# Patient Record
Sex: Female | Born: 1985 | Race: Black or African American | Hispanic: No | Marital: Single | Smoking: Current every day smoker
Health system: Southern US, Community
[De-identification: ages and names within clinical notes are randomized; demographics above are authoritative.]

## PROBLEM LIST (undated history)

## (undated) DIAGNOSIS — F419 Anxiety disorder, unspecified: Secondary | ICD-10-CM

## (undated) DIAGNOSIS — L0291 Cutaneous abscess, unspecified: Secondary | ICD-10-CM

## (undated) DIAGNOSIS — F32A Depression, unspecified: Secondary | ICD-10-CM

## (undated) DIAGNOSIS — F329 Major depressive disorder, single episode, unspecified: Secondary | ICD-10-CM

## (undated) DIAGNOSIS — E669 Obesity, unspecified: Secondary | ICD-10-CM

---

## 2007-10-21 ENCOUNTER — Emergency Department (HOSPITAL_COMMUNITY): Admission: EM | Admit: 2007-10-21 | Discharge: 2007-10-21 | Payer: Self-pay | Admitting: Emergency Medicine

## 2008-07-20 ENCOUNTER — Emergency Department (HOSPITAL_COMMUNITY): Admission: EM | Admit: 2008-07-20 | Discharge: 2008-07-21 | Payer: Self-pay | Admitting: Emergency Medicine

## 2009-05-12 ENCOUNTER — Emergency Department (HOSPITAL_COMMUNITY): Admission: EM | Admit: 2009-05-12 | Discharge: 2009-05-13 | Payer: Self-pay | Admitting: Emergency Medicine

## 2009-12-31 ENCOUNTER — Emergency Department (HOSPITAL_COMMUNITY): Admission: EM | Admit: 2009-12-31 | Discharge: 2009-12-31 | Payer: Self-pay | Admitting: Emergency Medicine

## 2010-06-06 LAB — URINE CULTURE: Colony Count: >=100000

## 2010-06-06 LAB — URINALYSIS, ROUTINE W REFLEX MICROSCOPIC
Nitrite: NEGATIVE
Specific Gravity, Urine: 1.039 — ABNORMAL HIGH (ref 1.005–1.030)
Urobilinogen, UA: 1 mg/dL (ref 0.0–1.0)
pH: 5.5 (ref 5.0–8.0)

## 2010-06-06 LAB — GC/CHLAMYDIA PROBE AMP, GENITAL: GC Probe Amp, Genital: NEGATIVE

## 2010-06-06 LAB — WET PREP, GENITAL: WBC, Wet Prep HPF POC: NONE SEEN

## 2010-06-06 LAB — POCT PREGNANCY, URINE: Preg Test, Ur: NEGATIVE

## 2011-02-22 ENCOUNTER — Emergency Department (HOSPITAL_COMMUNITY)
Admission: EM | Admit: 2011-02-22 | Discharge: 2011-02-22 | Disposition: A | Discharge status: Home / Self Care | Payer: Self-pay | Attending: Emergency Medicine | Admitting: Emergency Medicine

## 2011-02-22 ENCOUNTER — Encounter: Payer: Self-pay | Admitting: *Deleted

## 2011-02-22 DIAGNOSIS — N390 Urinary tract infection, site not specified: Secondary | ICD-10-CM | POA: Insufficient documentation

## 2011-02-22 DIAGNOSIS — A64 Unspecified sexually transmitted disease: Secondary | ICD-10-CM

## 2011-02-22 DIAGNOSIS — A599 Trichomoniasis, unspecified: Secondary | ICD-10-CM | POA: Insufficient documentation

## 2011-02-22 DIAGNOSIS — L989 Disorder of the skin and subcutaneous tissue, unspecified: Secondary | ICD-10-CM | POA: Insufficient documentation

## 2011-02-22 DIAGNOSIS — B9689 Other specified bacterial agents as the cause of diseases classified elsewhere: Secondary | ICD-10-CM | POA: Insufficient documentation

## 2011-02-22 DIAGNOSIS — A499 Bacterial infection, unspecified: Secondary | ICD-10-CM | POA: Insufficient documentation

## 2011-02-22 DIAGNOSIS — N760 Acute vaginitis: Secondary | ICD-10-CM | POA: Insufficient documentation

## 2011-02-22 DIAGNOSIS — R109 Unspecified abdominal pain: Secondary | ICD-10-CM | POA: Insufficient documentation

## 2011-02-22 LAB — URINALYSIS, ROUTINE W REFLEX MICROSCOPIC
Glucose, UA: NEGATIVE mg/dL
Ketones, ur: 40 mg/dL — AB
pH: 5 (ref 5.0–8.0)

## 2011-02-22 LAB — URINE MICROSCOPIC-ADD ON

## 2011-02-22 LAB — WET PREP, GENITAL

## 2011-02-22 LAB — PREGNANCY, URINE: Preg Test, Ur: NEGATIVE

## 2011-02-22 MED ORDER — SULFAMETHOXAZOLE-TRIMETHOPRIM 800-160 MG PO TABS: 1.0000 | ORAL_TABLET | Freq: Two times a day (BID) | ORAL | Status: AC

## 2011-02-22 MED ORDER — CEFTRIAXONE SODIUM 250 MG IJ SOLR
250.0000 mg | Freq: Once | INTRAMUSCULAR | Status: AC
  Administered 2011-02-22: 250 mg via INTRAMUSCULAR
  Filled 2011-02-22: qty 250

## 2011-02-22 MED ORDER — AZITHROMYCIN 250 MG PO TABS
1000.0000 mg | ORAL_TABLET | Freq: Once | ORAL | Status: AC
  Administered 2011-02-22: 1000 mg via ORAL
  Filled 2011-02-22: qty 1

## 2011-02-22 MED ORDER — METRONIDAZOLE 500 MG PO TABS: 500.0000 mg | ORAL_TABLET | Freq: Two times a day (BID) | ORAL | Status: AC

## 2011-02-22 NOTE — ED Notes (Signed)
Pt to ED for eval of vaginal discomfort x several weeks. Pt states she has intermittent itching and discomfort that is not relieved by over the counter remedies. Pt is sexually active. Reports strong odor.

## 2011-02-22 NOTE — ED Provider Notes (Signed)
History     CSN: [id number] Arrival date & time: 02/22/2011  1:28 PM   First MD Initiated Contact with Patient 02/22/11 1549      Chief Complaint  Patient presents with  . Vaginal Discharge    (Consider location/radiation/quality/duration/timing/severity/associated sxs/prior treatment) Patient is a 25 y.o. female presenting with vaginal discharge. The history is provided by the patient.  Vaginal Discharge This is a recurrent problem. Associated symptoms include abdominal pain.   patient states that she's had vaginal discharge and burning for the last month. She states she has been on Monistat several times. She's also been on these oh to treat urinary tract infections. She states she had a history of yeast infections and bacterial vaginosis. She states she's been previously treated in the ER [facility] the health department and in [place] for this. She states is a possibility she could be pregnant. She also states she's having some difficulty urinating. No fevers. No diarrhea or constipation. No relief with her medications. She states she is sexually active with one partner, she does not use any protection. She now states she also has painful bumps in her vaginal area.  History reviewed. No pertinent past medical history.  History reviewed. No pertinent past surgical history.  No family history on file.  History  Substance Use Topics  . Smoking status: Current Everyday Smoker  . Smokeless tobacco: Not on file  . Alcohol Use: Yes    OB History    Grav Para Term Preterm Abortions TAB SAB Ect Mult Living                  Review of Systems  Constitutional: Negative for chills.  Gastrointestinal: Positive for abdominal pain. Negative for diarrhea and constipation.  Genitourinary: Positive for dysuria, vaginal discharge and genital sores.    Allergies  Review of patient's allergies indicates no known allergies.  Home Medications   Current Outpatient Rx  Name Route Sig Dispense  Refill  . AZO TABS PO Oral Take 1 tablet by mouth 2 (two) times daily.      Marland Kitchen METRONIDAZOLE 500 MG PO TABS Oral Take 1 tablet (500 mg total) by mouth 2 (two) times daily. 14 tablet 0    BP 107/77  Pulse 75  Temp(Src) 98.3 F (36.8 C) (Oral)  Resp 15  SpO2 99%  Physical Exam  Constitutional: She appears well-developed.       Obese  Cardiovascular: Normal rate and regular rhythm.   Abdominal: Soft. She exhibits no distension. There is no tenderness.  Genitourinary:       Thick white vaginal discharge. No cervical motion tenderness. No adnexal tenderness. No labial lesions.  Skin: Skin is warm.    ED Course  Procedures (including critical care time)  Labs Reviewed  URINALYSIS, ROUTINE W REFLEX MICROSCOPIC - Abnormal; Notable for the following:    Color, Urine AMBER (*) BIOCHEMICALS MAY BE AFFECTED BY COLOR   APPearance TURBID (*)    Hgb urine dipstick TRACE (*)    Bilirubin Urine SMALL (*)    Ketones, ur 40 (*)    Leukocytes, UA LARGE (*)    All other components within normal limits  WET PREP, GENITAL - Abnormal; Notable for the following:    Trich, Wet Prep MANY (*)    Clue Cells, Wet Prep FEW (*)    WBC, Wet Prep HPF POC MANY (*)    All other components within normal limits  URINE MICROSCOPIC-ADD ON - Abnormal; Notable for the following:  Squamous Epithelial / LPF MANY (*)    Bacteria, UA FEW (*)    All other components within normal limits  PREGNANCY, URINE  GC/CHLAMYDIA PROBE AMP, GENITAL  RPR   No results found.   1. STD (female)   2. UTI (lower urinary tract infection)   3. Trichimoniasis   4. BV (bacterial vaginosis)       MDM  Pelvic pain and vaginal discharge. BV trichomoniasis and many white cells on wet prep. She also appears to urine tract infection. She'll be treated for all. She be discharged home to follow with the health Department.        Juliet Rude. Rubin Payor, MD 02/22/11 [phone]

## 2011-02-22 NOTE — ED Notes (Signed)
Itching and burning in her vagina for a week,  She has tried otc tx w/o relief

## 2011-02-23 LAB — GC/CHLAMYDIA PROBE AMP, GENITAL: Chlamydia, DNA Probe: NEGATIVE

## 2011-03-19 HISTORY — PX: APPENDECTOMY: SHX54

## 2011-08-16 ENCOUNTER — Emergency Department (HOSPITAL_COMMUNITY)
Admission: EM | Admit: 2011-08-16 | Discharge: 2011-08-16 | Disposition: A | Discharge status: Home / Self Care | Payer: Self-pay | Attending: Emergency Medicine | Admitting: Emergency Medicine

## 2011-08-16 ENCOUNTER — Encounter (HOSPITAL_COMMUNITY): Payer: Self-pay

## 2011-08-16 DIAGNOSIS — F17200 Nicotine dependence, unspecified, uncomplicated: Secondary | ICD-10-CM | POA: Insufficient documentation

## 2011-08-16 DIAGNOSIS — N760 Acute vaginitis: Secondary | ICD-10-CM | POA: Insufficient documentation

## 2011-08-16 DIAGNOSIS — L0591 Pilonidal cyst without abscess: Secondary | ICD-10-CM | POA: Insufficient documentation

## 2011-08-16 DIAGNOSIS — B9689 Other specified bacterial agents as the cause of diseases classified elsewhere: Secondary | ICD-10-CM | POA: Insufficient documentation

## 2011-08-16 DIAGNOSIS — A499 Bacterial infection, unspecified: Secondary | ICD-10-CM | POA: Insufficient documentation

## 2011-08-16 HISTORY — DX: Cutaneous abscess, unspecified: L02.91

## 2011-08-16 LAB — URINALYSIS, ROUTINE W REFLEX MICROSCOPIC
Protein, ur: NEGATIVE mg/dL
Urobilinogen, UA: 1 mg/dL (ref 0.0–1.0)

## 2011-08-16 LAB — URINE MICROSCOPIC-ADD ON

## 2011-08-16 LAB — WET PREP, GENITAL
Trich, Wet Prep: NONE SEEN
Yeast Wet Prep HPF POC: NONE SEEN

## 2011-08-16 LAB — POCT PREGNANCY, URINE: Preg Test, Ur: NEGATIVE

## 2011-08-16 MED ORDER — METRONIDAZOLE 500 MG PO TABS: 500.0000 mg | ORAL_TABLET | Freq: Two times a day (BID) | ORAL | Status: AC

## 2011-08-16 MED ORDER — OXYCODONE-ACETAMINOPHEN 5-325 MG PO TABS: 1.0000 | ORAL_TABLET | ORAL | Status: AC | PRN

## 2011-08-16 NOTE — ED Notes (Signed)
Pt. Has an abscess located on her lt. Buttocks.  She reports having this for a long time and has had this lanced a few time also.  It justkeeps coming back. Also having dsyuria,  Hematuria, and pelvic pain.  Also having vaginal itching and discharge

## 2011-08-16 NOTE — ED Provider Notes (Signed)
History     CSN: [id number]  Arrival date & time 08/16/11  1024   First MD Initiated Contact with Patient 08/16/11 1100      Chief Complaint  Patient presents with  . Abscess    (Consider location/radiation/quality/duration/timing/severity/associated sxs/prior treatment) Patient is a 26 y.o. female presenting with abscess. The history is provided by the patient.  Abscess   She complains of a painful abscess near the top of her left buttock. She states the abscess is been there for 5 years and has been lanced numerous times and always comes back. It is not necessarily any larger today than it has been in the past. It is uncomfortable to sit because of pressure on the abscess. She denies fever, chills, sweats. She is also complaining of a urinary tract infection. She states that she has had symptoms of urinary tract infection for the last 2 months and she was given an antibiotic that did not help. She does not know what the antibiotic was but thinks it may been Bactrim and something else. Symptoms are intermittent and she has an occasional dysuria and occasional urinary urgency and other times she has some urinary incontinence. She's also been having problems with a vaginal discharge which has been treated and keeps coming back. She is a rather poor historian and is very vague on timeframes and medications that were used to treat. She states she is currently sexually abstinent.  Past Medical History  Diagnosis Date  . Abscess     History reviewed. No pertinent past surgical history.  No family history on file.  History  Substance Use Topics  . Smoking status: Current Everyday Smoker  . Smokeless tobacco: Not on file  . Alcohol Use: Yes    OB History    Grav Para Term Preterm Abortions TAB SAB Ect Mult Living                  Review of Systems  All other systems reviewed and are negative.    Allergies  Review of patient's allergies indicates no known allergies.  Home  Medications  No current outpatient prescriptions on file.  BP 156/97  Pulse 81  Temp(Src) 99 F (37.2 C) (Oral)  Resp 20  SpO2 96%  LMP 08/03/2011  Physical Exam  Nursing note and vitals reviewed.  26 year old female is resting comfortably and in no acute distress. Vital signs are significant for mild hypertension with blood pressure 156/97. Oxygen saturation is 96% which is normal. Head is normocephalic and atraumatic. PERRLA, EOMI. Oropharynx clear. Neck is nontender and supple. Back is nontender. Lungs are clear without rales, wheezes, rhonchi. Heart has regular rate rhythm without murmur. Abdomen is soft, flat, nontender without masses or hepatosplenomegaly and peristalsis is present. There is no CVA tenderness. There is approximately 2 cm cysts in the superior aspect of the left by Dr. which appears to actually be a pilonidal cyst. There is no overlying erythema or warmth. Extremities have no cyanosis or edema, full range of motion is present. Skin is warm and dry without rash. Neurologic: Mental status is normal, cranial nerves are intact, there are no focal motor or sensory deficits.  ED Course  Procedures (including critical care time)  Results for orders placed during the hospital encounter of 08/16/11  URINALYSIS, ROUTINE W REFLEX MICROSCOPIC      Component Value Range   Color, Urine AMBER (*) YELLOW    APPearance CLOUDY (*) CLEAR    Specific Gravity, Urine 1.028  1.005 -  1.030    pH 5.5  5.0 - 8.0    Glucose, UA NEGATIVE  NEGATIVE (mg/dL)   Hgb urine dipstick NEGATIVE  NEGATIVE    Bilirubin Urine SMALL (*) NEGATIVE    Ketones, ur NEGATIVE  NEGATIVE (mg/dL)   Protein, ur NEGATIVE  NEGATIVE (mg/dL)   Urobilinogen, UA 1.0  0.0 - 1.0 (mg/dL)   Nitrite NEGATIVE  NEGATIVE    Leukocytes, UA TRACE (*) NEGATIVE   POCT PREGNANCY, URINE      Component Value Range   Preg Test, Ur NEGATIVE  NEGATIVE   URINE MICROSCOPIC-ADD ON      Component Value Range   Squamous Epithelial / LPF  MANY (*) RARE    WBC, UA 0-2  <3 (WBC/hpf)   RBC / HPF 0-2  <3 (RBC/hpf)   Bacteria, UA MANY (*) RARE    Urine-Other MUCOUS PRESENT    WET PREP, GENITAL      Component Value Range   Yeast Wet Prep HPF POC NONE SEEN  NONE SEEN    Trich, Wet Prep NONE SEEN  NONE SEEN    Clue Cells Wet Prep HPF POC MANY (*) NONE SEEN    WBC, Wet Prep HPF POC FEW (*) NONE SEEN      1. Pilonidal cyst   2. Bacterial vaginosis       MDM  Have reviewed the patient's prior records and the antibiotic that she says she was given an 2 months ago was actually given an December 6. On further questioning she says actually the symptoms did get better for about a week before he turning. She was also treated for bacterial vaginosis and trichomoniasis with Flagyl. Her diagnoses of urinary tract infection was rather tenuous in that it was clearly a dirty specimen with only slight area and some slight bacteria. Gonorrhea and Chlamydia cultures were negative.. I do not feel that her cyst would be best managed with incision and drainage. Since it has recurred multiple times, I think she needs to be evaluated for excision of the cyst and she will be referred to [facility] for that. Pelvic examination will be needed to evaluate her discharge however bacterial vaginosis is certainly very prone to recurrence.  Pelvic examination shows normal external female genitalia. There is a small amount of a white vaginal discharge. Cervix appears normal. There are no adnexal masses or tenderness. Fundus is normal size and position there is no cervical motion tenderness.  Wet prep confirms presence of bacterial vaginosis with no evidence of Trichomonas. Since symptoms recurred after a one-week course of metronidazole, I will give her a trial of a two-week course of metronidazole. She is referred to [facility] for her evaluation of possible excision of pilonidal cyst. She's also given a prescription for Percocet for  pain.    Dione Booze, MD 08/16/11 1248

## 2011-08-16 NOTE — ED Notes (Signed)
Pt reports she's currently sexually active, not using any protection. Currently not trying to get pregnant. Reports vaginal discharge, burning with urination and painful abscess to top of buttocks. Urine sample obtained, patient placed in gown.

## 2011-08-16 NOTE — Discharge Instructions (Signed)
Make an appointment with the surgeon to see if removing your cyst would be appropriate.  Bacterial Vaginosis Bacterial vaginosis (BV) is a vaginal infection where the normal balance of bacteria in the vagina is disrupted. The normal balance is then replaced by an overgrowth of certain bacteria. There are several different kinds of bacteria that can cause BV. BV is the most common vaginal infection in women of childbearing age. CAUSES   The cause of BV is not fully understood. BV develops when there is an increase or imbalance of harmful bacteria.   Some activities or behaviors can upset the normal balance of bacteria in the vagina and put women at increased risk including:   Having a new sex partner or multiple sex partners.   Douching.   Using an intrauterine device (IUD) for contraception.   It is not clear what role sexual activity plays in the development of BV. However, women that have never had sexual intercourse are rarely infected with BV.  Women do not get BV from toilet seats, bedding, swimming pools or from touching objects around them.  SYMPTOMS   Grey vaginal discharge.   A fish-like odor with discharge, especially after sexual intercourse.   Itching or burning of the vagina and vulva.   Burning or pain with urination.   Some women have no signs or symptoms at all.  DIAGNOSIS  Your caregiver must examine the vagina for signs of BV. Your caregiver will perform lab tests and look at the sample of vaginal fluid through a microscope. They will look for bacteria and abnormal cells (clue cells), a pH test higher than 4.5, and a positive amine test all associated with BV.  RISKS AND COMPLICATIONS   Pelvic inflammatory disease (PID).   Infections following gynecology surgery.   Developing HIV.   Developing herpes virus.  TREATMENT  Sometimes BV will clear up without treatment. However, all women with symptoms of BV should be treated to avoid complications, especially if  gynecology surgery is planned. Female partners generally do not need to be treated. However, BV may spread between female sex partners so treatment is helpful in preventing a recurrence of BV.   BV may be treated with antibiotics. The antibiotics come in either pill or vaginal cream forms. Either can be used with nonpregnant or pregnant women, but the recommended dosages differ. These antibiotics are not harmful to the baby.   BV can recur after treatment. If this happens, a second round of antibiotics will often be prescribed.   Treatment is important for pregnant women. If not treated, BV can cause a premature delivery, especially for a pregnant woman who had a premature birth in the past. All pregnant women who have symptoms of BV should be checked and treated.   For chronic reoccurrence of BV, treatment with a type of prescribed gel vaginally twice a week is helpful.  HOME CARE INSTRUCTIONS   Finish all medication as directed by your caregiver.   Do not have sex until treatment is completed.   Tell your sexual partner that you have a vaginal infection. They should see their caregiver and be treated if they have problems, such as a mild rash or itching.   Practice safe sex. Use condoms. Only have 1 sex partner.  PREVENTION  Basic prevention steps can help reduce the risk of upsetting the natural balance of bacteria in the vagina and developing BV:  Do not have sexual intercourse (be abstinent).   Do not douche.   Use all  of the medicine prescribed for treatment of BV, even if the signs and symptoms go away.   Tell your sex partner if you have BV. That way, they can be treated, if needed, to prevent reoccurrence.  SEEK MEDICAL CARE IF:   Your symptoms are not improving after 3 days of treatment.   You have increased discharge, pain, or fever.  MAKE SURE YOU:   Understand these instructions.   Will watch your condition.   Will get help right away if you are not doing well or get  worse.  FOR MORE INFORMATION  Division of STD Prevention (DSTDP), Centers for Disease Control and Prevention: [employer]Apps.[employer].za American Social Health Association (ASHA): www.ashastd.org  Document Released: 03/04/2005 Document Revised: 02/21/2011 Document Reviewed: 08/25/2008 [facility] Patient Information 2012 [place], [place].  Pilonidal Cyst A pilonidal cyst occurs when hairs get trapped (ingrown) beneath the skin in the crease between the buttocks over your sacrum (the bone under that crease). Pilonidal cysts are most common in young men with a lot of body hair. When the cyst is ruptured (breaks) or leaking, fluid from the cyst may cause burning and itching. If the cyst becomes infected, it causes a painful swelling filled with pus (abscess). The pus and trapped hairs need to be removed (often by lancing) so that the infection can heal. However, recurrence is common and an operation may be needed to remove the cyst. HOME CARE INSTRUCTIONS   If the cyst was NOT INFECTED:   Keep the area clean and dry. Bathe or shower daily. Wash the area well with a germ-killing soap. Warm tub baths may help prevent infection and help with drainage. Dry the area well with a towel.   Avoid tight clothing to keep area as moisture free as possible.   Keep area between buttocks as free of hair as possible. A depilatory may be used.   If the cyst WAS INFECTED and needed to be drained:   Your caregiver packed the wound with gauze to keep the wound open. This allows the wound to heal from the inside outwards and continue draining.   Return for a wound check in 1 day or as suggested.   If you take tub baths or showers, repack the wound with gauze following them. Sponge baths (at the sink) are a good alternative.   If an antibiotic was ordered to fight the infection, take as directed.   Only take over-the-counter or prescription medicines for pain, discomfort, or fever as directed by your caregiver.   After the  drain is removed, use sitz baths for 20 minutes 4 times per day. Clean the wound gently with mild unscented soap, pat dry, and then apply a dry dressing.  SEEK MEDICAL CARE IF:   You have increased pain, swelling, redness, drainage, or bleeding from the area.   You have a fever.   You have muscles aches, dizziness, or a general ill feeling.  Document Released: 03/01/2000 Document Revised: 02/21/2011 Document Reviewed: 04/29/2008 [facility] Patient Information 2012 [place], [place].

## 2011-08-19 ENCOUNTER — Encounter (INDEPENDENT_AMBULATORY_CARE_PROVIDER_SITE_OTHER): Payer: Self-pay | Admitting: Surgery

## 2011-08-19 ENCOUNTER — Ambulatory Visit (INDEPENDENT_AMBULATORY_CARE_PROVIDER_SITE_OTHER): Payer: Self-pay | Admitting: Surgery

## 2011-08-19 VITALS — BP 116/84 | HR 85 | Temp 97.8°F | Ht 68.0 in | Wt 276.4 lb

## 2011-08-19 DIAGNOSIS — L0591 Pilonidal cyst without abscess: Secondary | ICD-10-CM

## 2011-08-19 NOTE — Progress Notes (Signed)
Chief Complaint  Patient presents with  . New Evaluation    pilonidal cyst - referral by Dr. 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 Patient states that the pilonidal cyst is been present for 3-4 years. She has had at least 2 episodes that have required incision and drainage. She has intermittent discomfort. She presents today for consideration for definitive surgical excision.  Past Medical History  Diagnosis Date  . Abscess      Current Outpatient Prescriptions  Medication Sig Dispense Refill  . metroNIDAZOLE (FLAGYL) 500 MG tablet Take 1 tablet (500 mg total) by mouth 2 (two) times daily.  30 tablet  0  . oxyCODONE-acetaminophen (PERCOCET) 5-325 MG per tablet Take 1 tablet by mouth every 4 (four) hours as needed for pain.  12 tablet  0     No Known Allergies   History reviewed. No pertinent family history.   History   Social History  . Marital Status: Single    Spouse Name: N/A    Number of Children: N/A  . Years of Education: N/A   Social History Main Topics  . Smoking status: Current Everyday Smoker -- 1.0 packs/day    Types: Cigarettes  . Smokeless tobacco: None  . Alcohol Use: 1.2 oz/week    2 Shots of liquor per week     on weekends  . Drug Use: No  . Sexually Active:    Other Topics Concern  . None   Social History Narrative  . None     REVIEW OF SYSTEMS - PERTINENT POSITIVES ONLY: Mild tenderness. No recent drainage. Previous history of abscess.  EXAM: Filed Vitals:   08/19/11 1650  BP: 116/84  Pulse: 85  Temp: 97.8 F (36.6 C)    HEENT: normocephalic; pupils equal and reactive; sclerae clear; dentition good; mucous membranes moist NECK:  symmetric on extension; no palpable anterior or posterior cervical lymphadenopathy; no supraclavicular masses; no tenderness CHEST: clear to auscultation bilaterally  without rales, rhonchi, or wheezes CARDIAC: regular rate and rhythm without significant murmur; peripheral pulses are full GU:  Examination of the natal cleft shows a healed surgical wound at the upper extent. There are some small punctate sinus tracts immediately adjacent. There is induration extending mainly into the right upper buttock. There is no fluctuance. There is mild tenderness. EXT:  non-tender without edema; no deformity NEURO: no gross focal deficits; no sign of tremor   LABORATORY RESULTS: See [facility] ([facility]Epic) for most recent results   RADIOLOGY RESULTS: See [facility] ([facility]Epic) for most recent results   IMPRESSION: Pilonidal cyst with history of abscess  PLAN: I discussed with the patient the option for management. I provided her with written literature. I have recommended definitive surgical excision with open packing and wound healing by secondary intention. We have discussed the need for twice daily dressing changes. We'll discuss the fact that this will take at least 6 weeks to finally heal. She understands and wishes to proceed. We will make arrangements for outpatient surgery in the near future.  The risks and benefits of the procedure have been discussed at length with the patient.  The patient understands the proposed procedure, potential alternative treatments, and the course of recovery to be expected.  All of the patient's questions have been answered at this time.  The patient wishes to proceed with surgery.  Velora Heckler, MD, FACS  General & Endocrine Surgery [facility]   Visit Diagnoses: 1. Pilonidal cyst without abscess     Primary Care Physician: No primary provider on file.

## 2011-08-19 NOTE — Patient Instructions (Signed)
Pilonidal Cyst A pilonidal cyst occurs when hairs get trapped (ingrown) beneath the skin in the crease between the buttocks over your sacrum (the bone under that crease). Pilonidal cysts are most common in young men with a lot of body hair. When the cyst is ruptured (breaks) or leaking, fluid from the cyst may cause burning and itching. If the cyst becomes infected, it causes a painful swelling filled with pus (abscess). The pus and trapped hairs need to be removed (often by lancing) so that the infection can heal. However, recurrence is common and an operation may be needed to remove the cyst. HOME CARE INSTRUCTIONS   If the cyst was NOT INFECTED:   Keep the area clean and dry. Bathe or shower daily. Wash the area well with a germ-killing soap. Warm tub baths may help prevent infection and help with drainage. Dry the area well with a towel.   Avoid tight clothing to keep area as moisture free as possible.   Keep area between buttocks as free of hair as possible. A depilatory may be used.   If the cyst WAS INFECTED and needed to be drained:   Your caregiver packed the wound with gauze to keep the wound open. This allows the wound to heal from the inside outwards and continue draining.   Return for a wound check in 1 day or as suggested.   If you take tub baths or showers, repack the wound with gauze following them. Sponge baths (at the sink) are a good alternative.   If an antibiotic was ordered to fight the infection, take as directed.   Only take over-the-counter or prescription medicines for pain, discomfort, or fever as directed by your caregiver.   After the drain is removed, use sitz baths for 20 minutes 4 times per day. Clean the wound gently with mild unscented soap, pat dry, and then apply a dry dressing.  SEEK MEDICAL CARE IF:   You have increased pain, swelling, redness, drainage, or bleeding from the area.   You have a fever.   You have muscles aches, dizziness, or a  general ill feeling.  Document Released: 03/01/2000 Document Revised: 02/21/2011 Document Reviewed: 04/29/2008 [employer] Patient Information 2012 [employer]. 

## 2012-11-23 ENCOUNTER — Encounter (HOSPITAL_COMMUNITY): Payer: Self-pay | Admitting: Emergency Medicine

## 2012-11-23 ENCOUNTER — Emergency Department (HOSPITAL_COMMUNITY)
Admission: EM | Admit: 2012-11-23 | Discharge: 2012-11-24 | Disposition: A | Discharge status: Other Acute Inpatient Hospital | Payer: Self-pay | Attending: Emergency Medicine | Admitting: Emergency Medicine

## 2012-11-23 DIAGNOSIS — F17200 Nicotine dependence, unspecified, uncomplicated: Secondary | ICD-10-CM | POA: Insufficient documentation

## 2012-11-23 DIAGNOSIS — R45851 Suicidal ideations: Secondary | ICD-10-CM | POA: Insufficient documentation

## 2012-11-23 DIAGNOSIS — Z872 Personal history of diseases of the skin and subcutaneous tissue: Secondary | ICD-10-CM | POA: Insufficient documentation

## 2012-11-23 DIAGNOSIS — Z3202 Encounter for pregnancy test, result negative: Secondary | ICD-10-CM | POA: Insufficient documentation

## 2012-11-23 DIAGNOSIS — F329 Major depressive disorder, single episode, unspecified: Secondary | ICD-10-CM | POA: Insufficient documentation

## 2012-11-23 HISTORY — DX: Depression, unspecified: F32.A

## 2012-11-23 HISTORY — DX: Major depressive disorder, single episode, unspecified: F32.9

## 2012-11-23 NOTE — ED Notes (Signed)
Charge RN and [facility] aware of patient.

## 2012-11-23 NOTE — ED Notes (Signed)
Pt changing into scrubs at this time.  Will draw labs when pt completes same.

## 2012-11-23 NOTE — ED Notes (Signed)
Pt. reports feeling depressed and suicidal for several weeks , did not specify plan of suicide. Paper scrubs given to pt. Notified charged nurse for sitter and security to wand pt.

## 2012-11-24 ENCOUNTER — Inpatient Hospital Stay (HOSPITAL_COMMUNITY)
Admission: AD | Admit: 2012-11-24 | Discharge: 2012-11-28 | DRG: 885 | Disposition: A | Discharge status: Home / Self Care | Payer: No Typology Code available for payment source | Source: Intra-hospital | Attending: Psychiatry | Admitting: Psychiatry

## 2012-11-24 ENCOUNTER — Encounter (HOSPITAL_COMMUNITY): Payer: Self-pay | Admitting: Emergency Medicine

## 2012-11-24 DIAGNOSIS — Z79899 Other long term (current) drug therapy: Secondary | ICD-10-CM

## 2012-11-24 DIAGNOSIS — F332 Major depressive disorder, recurrent severe without psychotic features: Principal | ICD-10-CM | POA: Diagnosis present

## 2012-11-24 DIAGNOSIS — L0591 Pilonidal cyst without abscess: Secondary | ICD-10-CM

## 2012-11-24 DIAGNOSIS — F1210 Cannabis abuse, uncomplicated: Secondary | ICD-10-CM | POA: Diagnosis present

## 2012-11-24 DIAGNOSIS — R45851 Suicidal ideations: Secondary | ICD-10-CM

## 2012-11-24 DIAGNOSIS — F603 Borderline personality disorder: Secondary | ICD-10-CM

## 2012-11-24 DIAGNOSIS — F411 Generalized anxiety disorder: Secondary | ICD-10-CM | POA: Diagnosis present

## 2012-11-24 LAB — COMPREHENSIVE METABOLIC PANEL
AST: 13 U/L (ref 0–37)
CO2: 25 mEq/L (ref 19–32)
Calcium: 9.5 mg/dL (ref 8.4–10.5)
Chloride: 102 mEq/L (ref 96–112)
Creatinine, Ser: 0.71 mg/dL (ref 0.50–1.10)
GFR calc Af Amer: >90 mL/min (ref >90)
GFR calc non Af Amer: >90 mL/min (ref >90)
Glucose, Bld: 91 mg/dL (ref 70–99)
Total Bilirubin: 0.2 mg/dL — ABNORMAL LOW (ref 0.3–1.2)

## 2012-11-24 LAB — URINALYSIS, ROUTINE W REFLEX MICROSCOPIC
Bilirubin Urine: NEGATIVE
Hgb urine dipstick: NEGATIVE
Nitrite: NEGATIVE
Protein, ur: NEGATIVE mg/dL
Specific Gravity, Urine: 1.017 (ref 1.005–1.030)
Urobilinogen, UA: 1 mg/dL (ref 0.0–1.0)

## 2012-11-24 LAB — CBC WITH DIFFERENTIAL/PLATELET
Eosinophils Relative: 2 % (ref 0–5)
HCT: 41.6 % (ref 36.0–46.0)
Hemoglobin: 13.9 g/dL (ref 12.0–15.0)
Lymphocytes Relative: 26 % (ref 12–46)
Lymphs Abs: 2.2 10*3/uL (ref 0.7–4.0)
MCV: 87 fL (ref 78.0–100.0)
Monocytes Absolute: 0.7 10*3/uL (ref 0.1–1.0)
Monocytes Relative: 8 % (ref 3–12)
Neutro Abs: 5.5 10*3/uL (ref 1.7–7.7)
RBC: 4.78 MIL/uL (ref 3.87–5.11)
WBC: 8.6 10*3/uL (ref 4.0–10.5)

## 2012-11-24 LAB — ETHANOL: Alcohol, Ethyl (B): <11 mg/dL (ref 0–11)

## 2012-11-24 LAB — RAPID URINE DRUG SCREEN, HOSP PERFORMED
Barbiturates: NOT DETECTED
Benzodiazepines: NOT DETECTED

## 2012-11-24 LAB — POCT PREGNANCY, URINE: Preg Test, Ur: NEGATIVE

## 2012-11-24 MED ORDER — ACETAMINOPHEN 325 MG PO TABS: 650.0000 mg | ORAL_TABLET | Freq: Four times a day (QID) | ORAL | Status: DC | PRN

## 2012-11-24 MED ORDER — ALUM & MAG HYDROXIDE-SIMETH 200-200-20 MG/5ML PO SUSP: 30.0000 mL | ORAL | Status: DC | PRN

## 2012-11-24 MED ORDER — TRAZODONE HCL 50 MG PO TABS
50.0000 mg | ORAL_TABLET | Freq: Every evening | ORAL | Status: DC | PRN
  Administered 2012-11-24 – 2012-11-27 (×3): 50 mg via ORAL
  Filled 2012-11-24 (×3): qty 1

## 2012-11-24 MED ORDER — MAGNESIUM HYDROXIDE 400 MG/5ML PO SUSP: 30.0000 mL | Freq: Every day | ORAL | Status: DC | PRN

## 2012-11-24 NOTE — ED Notes (Signed)
Called house coverage to inquire after a sitter for the pt. This RN was told that there were no sitters available to sit with the pt. Pt placed in a monitored room.

## 2012-11-24 NOTE — Progress Notes (Signed)
B.Griffon Herberg, MHT provided support paper work to [facility] for patient who has been accepted for treatment. Copy of support paper work has been faxed to [facility] and originals provided to nurse to have delivered with security.

## 2012-11-24 NOTE — ED Notes (Signed)
Report Called to [facility].
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
## 2012-11-24 NOTE — ED Provider Notes (Signed)
Patient accepted to [facility], Dr. Aneta Mins, MD 11/24/12 [phone]

## 2012-11-24 NOTE — [facility] Counselor (Signed)
Pt accepted to [facility] to Dr. Elsie Saas, bed 500-1. Per Lupita Leash, RN-Charge the pt can come after 1930 this evening due to a high volume of pt's coming between 1615 and 1900. Denice Bors, AADC 11/24/2012 5:30 PM
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
## 2012-11-24 NOTE — ED Notes (Signed)
Spoke with Berna Spare at [facility], and was told that the pt would be put on the list for Telepsychs today. No appointment time given.

## 2012-11-24 NOTE — ED Notes (Signed)
Pt. Asking to go outside and smoke. Informed patient that this is not allowed. Updated on delay and plan of care.

## 2012-11-24 NOTE — ED Notes (Signed)
Patient transported at this time to [facility] with Security and Comptroller. All belongings and paperwork given to Security. Pt alert and oriented, vitals stable.
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
## 2012-11-24 NOTE — Progress Notes (Signed)
Patient ID: Kimberly Contreras, female   DOB: [birth year], 27 y.o.   MRN: [id number]  Admission Note: Patient is a 27 yo female who is admitted for depression and SI with no plan. Pt states her boyfriend of four years just broke up with her. She states she was living with him and now has had to go to a homeless shelter. Pt states she doesn't have a lot of support otherwise. Pt tearful throughout admission assessment. Pt with passive at this time. Pt verbally contracts for safety. No distress noted. Pt guarded.

## 2012-11-25 DIAGNOSIS — F1210 Cannabis abuse, uncomplicated: Secondary | ICD-10-CM

## 2012-11-25 DIAGNOSIS — F411 Generalized anxiety disorder: Secondary | ICD-10-CM

## 2012-11-25 DIAGNOSIS — R45851 Suicidal ideations: Secondary | ICD-10-CM

## 2012-11-25 DIAGNOSIS — F332 Major depressive disorder, recurrent severe without psychotic features: Principal | ICD-10-CM | POA: Diagnosis present

## 2012-11-25 MED ORDER — ARIPIPRAZOLE 5 MG PO TABS
5.0000 mg | ORAL_TABLET | Freq: Two times a day (BID) | ORAL | Status: DC
  Administered 2012-11-25 – 2012-11-28 (×7): 5 mg via ORAL
  Filled 2012-11-25 (×10): qty 1

## 2012-11-25 MED ORDER — FLUOXETINE HCL 20 MG PO CAPS
20.0000 mg | ORAL_CAPSULE | Freq: Every day | ORAL | Status: DC
  Administered 2012-11-25 – 2012-11-28 (×4): 20 mg via ORAL
  Filled 2012-11-25 (×5): qty 1
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
## 2012-11-25 NOTE — Progress Notes (Signed)
Recreation Therapy Notes  Date: 09.10.2014 Time: 3:00pm Location: [address] Dayroom  Group Topic: Self Expression  Goal Area(s) Addresses:  Patient will will use art as a means of self-expression. Patient will identify effectively identify emotions experienced during activity.   Behavioral Response: Did not attend.   Marykay Lex Ryonna Cimini, LRT/CTRS  Jearl Klinefelter 11/25/2012 7:57 PM

## 2012-11-25 NOTE — Progress Notes (Addendum)
Patient ID: Kimberly Contreras, female   DOB: 10/12/1985, 27 y.o.   MRN: [id number] Pt did not attend 11am Values group.

## 2012-11-25 NOTE — Progress Notes (Signed)
The focus of this group is to help patients review their daily goal of treatment and discuss progress on daily workbooks. Pt attended the evening group session but responded minimally to discussion prompts from the Writer. Pt reported having "an okay day," the highlight of which was being able to go outside. When asked about her future goals, Pt shared that she wanted to attend college to be a Administrator, sports. Pt appeared depressed and did not smile or interact with her peers during the group.

## 2012-11-25 NOTE — [facility] Group Notes (Signed)
[facility] LCSW Aftercare Discharge Planning Group Note   11/25/2012    8:45 AM  Participation Quality:  Alert and Appropriate   Mood/Affect:  Appropriate, Tearful and Depressed  Depression Rating:  8  Anxiety Rating:  8  Thoughts of Suicide:  Pt denies SI/HI  Will you contract for safety?   Yes  Current AVH:  Pt denies  Plan for Discharge/Comments:  Pt attended discharge planning group and actively participated in group.  CSW provided pt with today's workbook.  Pt reports coming to the hospital due to depressive symptoms and SI.  Pt reports that she lives in [place] with an "ex friend" and can return home there.  Pt denies any outpatient providers.  CSW will refer pt to [facility] for medication management and therapy.  No further needs voiced by pt at this time.    Transportation Means: Pt reports access to transportation - provided pt with a bus pass  Supports: No supports mentioned at this time  Reyes Ivan, LCSWA 11/25/2012 9:47 AM

## 2012-11-25 NOTE — Progress Notes (Addendum)
D: Patient having passive SI but verbally contracts for safety. Patient denies HI and auditory and visual hallucinations. The patient has a depress mood and affect. The patient has minimal interaction with staff and isolates to her room during the day. When questioned, patient just states that she "is fine" and that she "doesn't need anything." The patient attends groups but has minimal participation in them. The patient is not engaged within the milieu.  A: Patient given emotional support from RN. Patient encouraged to come to staff with concerns and/or questions. Patient's medication routine continued. Patient's orders and plan of care reviewed.  R: Patient remains cooperative. Will continue to monitor patient q15 minutes for safety.

## 2012-11-25 NOTE — Progress Notes (Signed)
Pt signed a 72 hour request for discharge on 11/25/12 @1958 

## 2012-11-25 NOTE — [facility] Suicide Risk Assessment (Signed)
Suicide Risk Assessment  Admission Assessment     Nursing information obtained from:  Patient Demographic factors:  Low socioeconomic status;Unemployed Current Mental Status:  Suicidal ideation indicated by patient Loss Factors:  Loss of significant relationship;Financial problems / change in socioeconomic status Historical Factors:  NA Risk Reduction Factors:  NA  CLINICAL FACTORS:   Severe Anxiety and/or Agitation Panic Attacks Depression:   Aggression Anhedonia Comorbid alcohol abuse/dependence Hopelessness Impulsivity Insomnia Recent sense of peace/wellbeing Severe Alcohol/Substance Abuse/Dependencies More than one psychiatric diagnosis Unstable or Poor Therapeutic Relationship Previous Psychiatric Diagnoses and Treatments Medical Diagnoses and Treatments/Surgeries  COGNITIVE FEATURES THAT CONTRIBUTE TO RISK:  Closed-mindedness Loss of executive function Polarized thinking Thought constriction (tunnel vision)    SUICIDE RISK:   Moderate:  Frequent suicidal ideation with limited intensity, and duration, some specificity in terms of plans, no associated intent, good self-control, limited dysphoria/symptomatology, some risk factors present, and identifiable protective factors, including available and accessible social support.  PLAN OF CARE: Patient admitted voluntarily, emergently for depression, anxiety and suicidal ideations with no plan but suicidal gesture of hanging with dog chain, has history of cutting herself and OD in the past.   I certify that inpatient services furnished can reasonably be expected to improve the patient's condition.  Nehemiah Settle., MD 11/25/2012, 11:58 AM

## 2012-11-25 NOTE — Progress Notes (Signed)
Adult Psychoeducational Group Note  Date:  11/25/2012 Time:  10:00am  Group Topic/Focus:  Therapeutic Activity  Participation Level:  Did Not Attend  Participation Quality:    Affect:    Cognitive:    Insight:   Engagement in Group:    Modes of Intervention:   Additional Comments:  Pt did not attend group  Shelly Bombard D 11/25/2012, 11:09 AM
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
## 2012-11-25 NOTE — Tx Team (Signed)
Interdisciplinary Treatment Plan Update (Adult)  Date: 11/25/2012  Time Reviewed:  9:45 AM  Progress in Treatment: Attending groups: Yes Participating in groups:  Yes Taking medication as prescribed:  Yes Tolerating medication:  Yes Family/Significant othe contact made: CSW assessing  Patient understands diagnosis:  Yes Discussing patient identified problems/goals with staff:  Yes Medical problems stabilized or resolved:  Yes Denies suicidal/homicidal ideation: Yes Issues/concerns per patient self-inventory:  Yes Other:  New problem(s) identified: N/A  Discharge Plan or Barriers: CSW assessing for appropriate referrals.  Reason for Continuation of Hospitalization: Anxiety Depression Medication Stabilization  Comments: N/A  Estimated length of stay: 3-5 days  For review of initial/current patient goals, please see plan of care.  Attendees: Patient:     Family:     Physician:  Dr. Johnalagadda 11/25/2012 10:12 AM   Nursing:   Roneicia Byrd, RN 11/25/2012 10:12 AM   Clinical Social Worker:  Crue Otero Horton, LCSWA 11/25/2012 10:12 AM   Other: Neil Mashburn, PA 11/25/2012 10:12 AM   Other:  Maseta Dorley, MA care coordination 11/25/2012 10:12 AM   Other:  Quylle Hodnett, LCSW 11/25/2012 10:12 AM   Other:  Brittany Tyson, RN 11/25/2012 10:13 AM   Other: Jennifer Clark, RN case manager 11/25/2012 10:13 AM   Other:    Other:    Other:    Other:    Other:     Scribe for Treatment Team:   Horton, [place]ford Schnitker Nicole, 11/25/2012 10:12 AM   

## 2012-11-26 NOTE — Progress Notes (Signed)
D: Pt is blunted in affect and neutral in mood.Pt is denying any depression or anxiety. Pt is also denying any SI/HI/AVH.  Pt attended group this evening. Pt observed with minimum interaction within the milieu. Despite encouragement, pt did not attend karaoke this evening. Pt is still voicing the readiness for discharge.  A: Writer administered scheduled and prn medications to pt. Continued support and availability as needed was extended to this pt. Staff continue to monitor pt with q53min checks.  R: No adverse drug reactions noted. Pt receptive to treatment. Pt remains safe at this time.

## 2012-11-26 NOTE — Progress Notes (Signed)
D:  Patient's self inventory sheet, patient sleeps well, poor appetite, low energy level, improving attention span.  Denied depression, hopeless, anxiety.  Denied withdrawals.  Denied SI.  Denied physical problems.  Plans to stay away from wrong people after discharge, take meds, go to MD appts.  Plans to live with grandmother after discharge.  Needs financial assistance to purchase meds after discharge. A:  Medications administered per MD orders.  Emotional support and encouragement given patient. R:  Denied SI and HI.  Denied A/V hallucinations.  Will continue to monitor patient for safety with 15 minute checks.  Safety maintained.

## 2012-11-26 NOTE — Progress Notes (Signed)
[facility] MD Progress Note  11/26/2012 3:06 PM Kimberly Contreras  MRN:  [id number] Subjective:  Kimberly Contreras is seen in her room today at noon, she is in bed. She states she is doing well and she is ready to go home. She reports her depression had been building over the past year and is better now after 1-2 doses of medication. She reports she said some "childish things," and "felt like it was the end of the world" and was crying. Now she feels better. This patient shows poor insight and poor judgement. She denies side effects to the medication. Diagnosis:   DSM5: Schizophrenia Disorders:  Obsessive-Compulsive Disorders:  Trauma-Stressor Disorders:  Substance/Addictive Disorders: Cannabis Use Disorder - Moderate 9304.30)  Depressive Disorders: Major Depressive Disorder - Severe (296.23)  AXIS I: Generalized Anxiety Disorder, Major Depression, Recurrent severe and Cannabis abuse and suicidal ideation  AXIS II: Deferred  AXIS III:  Past Medical History   Diagnosis  Date   .  Abscess    .  Depression     AXIS IV: economic problems, occupational problems, other psychosocial or environmental problems, problems related to social environment and problems with primary support group  AXIS V: 41-50 serious symptoms  ADL's:  Intact  Sleep: Good  Appetite:  Poor  Suicidal Ideation:  denies Homicidal Ideation:  denies AEB (as evidenced by):  Psychiatric Specialty Exam: Review of Systems  Constitutional: Negative.  Negative for fever, chills, weight loss, malaise/fatigue and diaphoresis.  HENT: Negative for congestion and sore throat.   Eyes: Negative for blurred vision, double vision and photophobia.  Respiratory: Negative for cough, shortness of breath and wheezing.   Cardiovascular: Negative for chest pain, palpitations and PND.  Gastrointestinal: Negative for heartburn, nausea, vomiting, abdominal pain, diarrhea and constipation.  Musculoskeletal: Negative for myalgias, joint pain and falls.   Neurological: Negative for dizziness, tingling, tremors, sensory change, speech change, focal weakness, seizures, loss of consciousness, weakness and headaches.  Endo/Heme/Allergies: Negative for polydipsia. Does not bruise/bleed easily.  Psychiatric/Behavioral: Negative for depression, suicidal ideas, hallucinations, memory loss and substance abuse. The patient is not nervous/anxious and does not have insomnia.     Blood pressure 122/92, pulse 88, temperature 98.5 F (36.9 C), temperature source Oral, resp. rate 21, height 5\' 11"  (1.803 m), weight 134.718 kg (297 lb), last menstrual period 11/15/2012.Body mass index is 41.44 kg/(m^2).  General Appearance: Disheveled  Eye Solicitor::  Fair  Speech:  Clear and Coherent  Volume:  Normal  Mood:  Anxious and Depressed  Affect:  Congruent  Thought Process:  Goal Directed  Orientation:  Full (Time, Place, and Person)  Thought Content:  WDL  Suicidal Thoughts:  No  Homicidal Thoughts:  No  Memory:  Immediate;   Fair  Judgement:  Poor  Insight:  Lacking  Psychomotor Activity:  Normal  Concentration:  Fair  Recall:  Fair  Akathisia:  No  Handed:  Right  AIMS (if indicated):     Assets:  Communication Skills Desire for Improvement  Sleep:  Number of Hours: 5.5   Current Medications: Current Facility-Administered Medications  Medication Dose Route Frequency Provider Last Rate Last Dose  . acetaminophen (TYLENOL) tablet 650 mg  650 mg Oral Q6H PRN Evanna Janann August, NP      . alum & mag hydroxide-simeth (MAALOX/MYLANTA) 200-200-20 MG/5ML suspension 30 mL  30 mL Oral Q4H PRN Evanna Janann August, NP      . ARIPiprazole (ABILIFY) tablet 5 mg  5 mg Oral BID Nehemiah Settle, MD  5 mg at 11/26/12 0818  . FLUoxetine (PROZAC) capsule 20 mg  20 mg Oral Daily Nehemiah Settle, MD   20 mg at 11/26/12 0818  . magnesium hydroxide (MILK OF MAGNESIA) suspension 30 mL  30 mL Oral Daily PRN Evanna Janann August, NP      . traZODone  (DESYREL) tablet 50 mg  50 mg Oral QHS PRN,MR X 1 Evanna Cori Merry Proud, NP   50 mg at 11/24/12 2245    Lab Results: No results found for this or any previous visit (from the past 48 hour(s)).  Physical Findings: AIMS: Facial and Oral Movements Muscles of Facial Expression: None, normal Lips and Perioral Area: None, normal Jaw: None, normal Tongue: None, normal,Extremity Movements Upper (arms, wrists, hands, fingers): None, normal Lower (legs, knees, ankles, toes): None, normal, Trunk Movements Neck, shoulders, hips: None, normal, Overall Severity Severity of abnormal movements (highest score from questions above): None, normal Incapacitation due to abnormal movements: None, normal Patient's awareness of abnormal movements (rate only patient's report): No Awareness, Dental Status Current problems with teeth and/or dentures?: No Does patient usually wear dentures?: No  CIWA:  CIWA-Ar Total: 0 COWS:  COWS Total Score: 1  Treatment Plan Summary: Daily contact with patient to assess and evaluate symptoms and progress in treatment Medication management  Plan: 1. Continue crisis management and stabilization. 2. Medication management to reduce current symptoms to base line and improve patient's overall level of functioning 3. Treat health problems as indicated. 4. Develop treatment plan to decrease risk of relapse upon discharge and the need for readmission. 5. Psycho-social education regarding relapse prevention and self care. 6. Health care follow up as needed for medical problems. 7. Continue home medications where appropriate. 8. ELOS: 2days  Medical Decision Making Problem Points:  Established problem, stable/improving (1) Data Points:  Review or order medicine tests (1)  I certify that inpatient services furnished can reasonably be expected to improve the patient's condition.  Rona Ravens. Mashburn RPAC 3:19 PM 11/26/2012  Reviewed the information documented and agree with the  treatment plan.  Samirah Scarpati,JANARDHAHA R. 11/27/2012 8:39 AM

## 2012-11-26 NOTE — Progress Notes (Signed)
D: Pt presents with a blunted affect this evening. Pt verbalizes that she is ready to leave. Pt denies any anxiety or depression. Pt is also denying any SI/HI/AVH. Pt attended group this evening. Pt observed with minimum interaction within the milieu.   A: Pt informed of her option to sign the 72 hour request for discharge. Writer verbalized the fact that the request could turn into a involuntarily change of status for her safety. Pt receptive of the information. Writer administered scheduled and prn medications to pt. Continued support and availability as needed was extended to this pt. Staff continue to monitor pt with q53min checks.  R: Pt signed the 72 hour request for discharge. No adverse drug reactions noted. Pt receptive to treatment. Pt remains safe at this time.

## 2012-11-26 NOTE — Progress Notes (Signed)
The focus of this group is to educate the patient on the purpose and policies of crisis stabilization and provide a format to answer questions about their admission.  The group details unit policies and expectations of patients while admitted.  Patient attended 0900 nurse education orientation group.  Patient actively participated, had appropriate affect, alert, appropriate insight and engagement.  Patient will work on goals for discharge today.

## 2012-11-26 NOTE — Progress Notes (Signed)
Adult Psychoeducational Group Note  Date:  11/26/2012 Time:  1:39 PM  Group Topic/Focus:  Overcoming Stress:   The focus of this group is to define stress and help patients assess their triggers.  Participation Level:  Minimal  Participation Quality:  Appropriate  Affect:  Flat  Cognitive:  Appropriate  Insight: Limited  Engagement in Group:  Limited  Modes of Intervention:  Discussion, Education and Support  Additional Comments:  Pt actively participated in group by completing "Stress Interview" worksheet. Pt offered no verbal contributions to group discussion.   Reinaldo Raddle K 11/26/2012, 1:39 PM

## 2012-11-26 NOTE — Progress Notes (Signed)
Adult Psychoeducational Group Note  Date:  11/26/2012 Time:  10:00am  Group Topic/Focus:  Therapeutic Activity  Participation Level:  Minimal  Participation Quality:  Appropriate  Affect:  Flat  Cognitive:  Appropriate  Insight: Appropriate  Engagement in Group:  Limited  Modes of Intervention:  Discussion and Education  Additional Comments:  Pt attended and participated in group. Pt was asked what was a good memory you have of your life that makes you smile when you think of it? Pt stated My memory is when I was on TV.  Shelly Bombard D 11/26/2012, 11:12 AM

## 2012-11-26 NOTE — [facility] Suicide Risk Assessment (Signed)
[facility] INPATIENT:  Family/Significant Other Suicide Prevention Education  Suicide Prevention Education:  Education Completed; Lara Mulch - friend [phone]),  (name of family member/significant other) has been identified by the patient as the family member/significant other with whom the patient will be residing, and identified as the person(s) who will aid the patient in the event of a mental health crisis (suicidal ideations/suicide attempt).  With written consent from the patient, the family member/significant other has been provided the following suicide prevention education, prior to the and/or following the discharge of the patient.  The suicide prevention education provided includes the following:  Suicide risk factors  Suicide prevention and interventions  National Suicide Hotline telephone number  [facility] assessment telephone number  [facility] Emergency Assistance 911  [facility] and/or Residential Mobile Crisis Unit telephone number  Request made of family/significant other to:  Remove weapons (e.g., guns, rifles, knives), all items previously/currently identified as safety concern.    Remove drugs/medications (over-the-counter, prescriptions, illicit drugs), all items previously/currently identified as a safety concern.  The family member/significant other verbalizes understanding of the suicide prevention education information provided.  The family member/significant other agrees to remove the items of safety concern listed above.  Carmina Miller 11/26/2012, 12:59 PM

## 2012-11-26 NOTE — [facility] Group Notes (Signed)
[facility] LCSW Group Therapy  11/26/2012  1:15 PM   Type of Therapy:  Group Therapy  Participation Level:  Active  Participation Quality:  Appropriate and Attentive  Affect:  Appropriate, Flat and Deprressed  Cognitive:  Alert and Appropriate  Insight:  Developing/Improving and Engaged  Engagement in Therapy:  Developing/Improving and Engaged  Modes of Intervention:  Clarification, Confrontation, Discussion, Education, Exploration, Limit-setting, Orientation, Problem-solving, Rapport Building, Dance movement psychotherapist, Socialization and Support  Summary of Progress/Problems: The topic for group was balance in life.  Today's group focused on defining balance in one's own words, identifying things that can knock one off balance, and exploring healthy ways to maintain balance in life. Group members were asked to provide an example of a time when they felt off balance, describe how they handled that situation,and process healthier ways to regain balance in the future. Group members were asked to share the most important tool for maintaining balance that they learned while at [facility] and how they plan to apply this method after discharge. Pt shared that her life is off balance right now due to dealing with the loss of a relationship.  Pt states that her life sucks right now but has hope things will get better.  Pt discussed focusing on herself and starting to care what she looks like and plans to get her GED when she goes home.  Pt states that she plans to ask for help when needed, to get her life back in balance.    Kimberly Contreras, LCSWA 11/26/2012 2:23 PM

## 2012-11-27 MED ORDER — FLUOXETINE HCL 20 MG PO CAPS: 20.0000 mg | ORAL_CAPSULE | Freq: Every day | ORAL | Status: DC

## 2012-11-27 MED ORDER — ARIPIPRAZOLE 5 MG PO TABS: 5.0000 mg | ORAL_TABLET | Freq: Two times a day (BID) | ORAL | Status: DC

## 2012-11-27 NOTE — [facility] Suicide Risk Assessment (Signed)
Suicide Risk Assessment  Discharge Assessment     Demographic Factors:  Adolescent or young adult and Low socioeconomic status  Mental Status Per Nursing Assessment::   On Admission:  Suicidal ideation indicated by patient  Current Mental Status by Physician: NA  Loss Factors: NA  Historical Factors: Impulsivity  Risk Reduction Factors:   Sense of responsibility to family, Religious beliefs about death, Employed, Living with another person, especially a relative, Positive social support, Positive therapeutic relationship and Positive coping skills or problem solving skills  Continued Clinical Symptoms:  Depression:   Recent sense of peace/wellbeing Previous Psychiatric Diagnoses and Treatments  Cognitive Features That Contribute To Risk:  Polarized thinking    Suicide Risk:  Minimal: No identifiable suicidal ideation.  Patients presenting with no risk factors but with morbid ruminations; may be classified as minimal risk based on the severity of the depressive symptoms  Discharge Diagnoses:   AXIS I:  Generalized Anxiety Disorder and Major Depression, Recurrent severe AXIS II:  Deferred AXIS III:   Past Medical History  Diagnosis Date  . Abscess   . Depression    AXIS IV:  other psychosocial or environmental problems and problems related to social environment AXIS V:  61-70 mild symptoms  Plan Of Care/Follow-up recommendations:  Activity:  as tolerated Diet:  Regular  Is patient on multiple antipsychotic therapies at discharge:  No   Has Patient had three or more failed trials of antipsychotic monotherapy by history:  No  Recommended Plan for Multiple Antipsychotic Therapies: NA  Harlin Mazzoni,JANARDHAHA R. 11/27/2012, 9:21 PM

## 2012-11-27 NOTE — Progress Notes (Signed)
Patient ID: Kimberly Contreras, female   DOB: 1985-09-22, 27 y.o.   MRN: [id number] D. Pt presents with depressed mood, affect blunted. Patient with minimal conversation with Clinical research associate throughout shift thus far , only stating '' I'm disappointed that I didn't get to go home today. The doctor said tomorrow, are you sure that it will be tomorrow? I want to make sure it doesn't get changed '' Patient reassured that patient slated for discharge tomorrow and that would be able to discuss this with covering MD in am. Patient verbalized understanding. Patient completed self inventory and rates depression at 1/10  10 being worst depression 1 being none. Patient states her plan to take better care of herself includes : worrying about myself, get a job and enjoy life. '' Patient denies any further acute concerns. A. Medications given , support and encouragement provided. R.Patient has attended unit programming. No further voiced concerns at this time. Will continue to monitor q 15 minutes for safety.

## 2012-11-27 NOTE — Tx Team (Signed)
Interdisciplinary Treatment Plan Update (Adult)  Date: 11/27/2012  Time Reviewed:  9:45 AM  Progress in Treatment: Attending groups: Yes Participating in groups:  Yes Taking medication as prescribed:  Yes Tolerating medication:  Yes Family/Significant othe contact made: Yes Patient understands diagnosis:  Yes Discussing patient identified problems/goals with staff:  Yes Medical problems stabilized or resolved:  Yes Denies suicidal/homicidal ideation: Yes Issues/concerns per patient self-inventory:  Yes Other:  New problem(s) identified: N/A  Discharge Plan or Barriers: Pt will follow up at [facility] for medication management and therapy.  Reason for Continuation of Hospitalization: Anxiety Depression Medication Stabilization  Comments: N/A  Estimated length of stay: 2-3 days  For review of initial/current patient goals, please see plan of care.  Attendees: Patient:     Family:     Physician:  Dr. Javier Glazier 11/27/2012 10:31 AM   Nursing:    11/27/2012 10:31 AM   Clinical Social Worker:  Reyes Ivan, LCSWA 11/27/2012 10:31 AM   Other: Verne Spurr, PA 11/27/2012 10:31 AM   Other:  Frankey Shown, MA care coordination 11/27/2012 10:31 AM   Other:  Juline Patch, LCSW 11/27/2012 10:31 AM   Other:  Audie Box, RN 11/27/2012 10:32 AM   Other: Orlean Patten, RN 11/27/2012 10:32 AM   Other:    Other:    Other:    Other:    Other:     Scribe for Treatment Team:   Carmina Miller, 11/27/2012 10:31 AM

## 2012-11-27 NOTE — [facility] Group Notes (Signed)
[facility] LCSW Group Therapy  11/27/2012  1:15 PM   Type of Therapy:  Group Therapy  Participation Level:  Active  Participation Quality:  Appropriate and Attentive  Affect:  Appropriate  Cognitive:  Alert and Appropriate  Insight:  Developing/Improving and Engaged  Engagement in Therapy:  Developing/Improving and Engaged  Modes of Intervention:  Clarification, Confrontation, Discussion, Education, Exploration, Limit-setting, Orientation, Problem-solving, Rapport Building, Dance movement psychotherapist, Socialization and Support  Summary of Progress/Problems: The topic for today was feelings about relapse.  Pt discussed what relapse prevention is to them and identified triggers that they are on the path to relapse.  Pt processed their feeling towards relapse and was able to relate to peers.  Pt discussed coping skills that can be used for relapse prevention.   Pt shared her substance use with the group, stating that she woke up daily and drank a gallon of liquor and smoked marijuana to cope.  Pt states that she now wants to stop using and plans to change her environment and people she socializes with to more positive people, so she can be supported.  Pt actively participated and was engaged in group discussion.    Reyes Ivan, LCSWA 11/27/2012 2:25 PM

## 2012-11-27 NOTE — Progress Notes (Signed)
Patient has been up and active on the unit, attended group this evening and has voiced no complaints. Patient currently denies having pain, -si/hi/a/v hall. Patient looking forward to discharge on tomorrow. Patient reports that she plans to work on  obtaining her GED after discharge Support and encouragement offered, safety maintained on unit, will continue to monitor.

## 2012-11-27 NOTE — Progress Notes (Signed)
Patient ID: Kimberly Contreras, female   DOB: 1985/07/21, 27 y.o.   MRN: [id number] [facility] MD Progress Note  11/27/2012 11:53 AM Shakoya Gilmore  MRN:  [id number]  Subjective:  Patient is up and active in the unit milieu. She is reporting no symptoms of depression or anxiety. She has a new job to start in the morning and is anxious to be discharged. She reports sleeping well and eating well and is hopefull to go home soon.  Diagnosis:   DSM5: Schizophrenia Disorders:  Obsessive-Compulsive Disorders:  Trauma-Stressor Disorders:  Substance/Addictive Disorders: Cannabis Use Disorder - Moderate 9304.30)  Depressive Disorders: Major Depressive Disorder - Severe (296.23)  AXIS I: Generalized Anxiety Disorder, Major Depression, Recurrent severe and Cannabis abuse and suicidal ideation  AXIS II: Deferred  AXIS III:  Past Medical History   Diagnosis  Date   .  Abscess    .  Depression     AXIS IV: economic problems, occupational problems, other psychosocial or environmental problems, problems related to social environment and problems with primary support group  AXIS V: 41-50 serious symptoms  ADL's:  Intact  Sleep: Good  Appetite:  Poor  Suicidal Ideation:  denies Homicidal Ideation:  denies AEB (as evidenced by):  Psychiatric Specialty Exam: Review of Systems  Constitutional: Negative.  Negative for fever, chills, weight loss, malaise/fatigue and diaphoresis.  HENT: Negative for congestion and sore throat.   Eyes: Negative for blurred vision, double vision and photophobia.  Respiratory: Negative for cough, shortness of breath and wheezing.   Cardiovascular: Negative for chest pain, palpitations and PND.  Gastrointestinal: Negative for heartburn, nausea, vomiting, abdominal pain, diarrhea and constipation.  Musculoskeletal: Negative for myalgias, joint pain and falls.  Neurological: Negative for dizziness, tingling, tremors, sensory change, speech change, focal weakness, seizures, loss of  consciousness, weakness and headaches.  Endo/Heme/Allergies: Negative for polydipsia. Does not bruise/bleed easily.  Psychiatric/Behavioral: Negative for depression, suicidal ideas, hallucinations, memory loss and substance abuse. The patient is not nervous/anxious and does not have insomnia.     Blood pressure 131/67, pulse 88, temperature 98.2 F (36.8 C), temperature source Oral, resp. rate 16, height 5\' 11"  (1.803 m), weight 134.718 kg (297 lb), last menstrual period 11/15/2012.Body mass index is 41.44 kg/(m^2).  General Appearance: Disheveled  Eye Solicitor::  Fair  Speech:  Clear and Coherent  Volume:  Normal  Mood:  Anxious and Depressed  Affect:  Congruent  Thought Process:  Goal Directed  Orientation:  Full (Time, Place, and Person)  Thought Content:  WDL  Suicidal Thoughts:  No  Homicidal Thoughts:  No  Memory:  Immediate;   Fair  Judgement:  Poor  Insight:  Lacking  Psychomotor Activity:  Normal  Concentration:  Fair  Recall:  Fair  Akathisia:  No  Handed:  Right  AIMS (if indicated):     Assets:  Communication Skills Desire for Improvement  Sleep:  Number of Hours: 5.25   Current Medications: Current Facility-Administered Medications  Medication Dose Route Frequency Provider Last Rate Last Dose  . acetaminophen (TYLENOL) tablet 650 mg  650 mg Oral Q6H PRN Evanna Janann August, NP      . alum & mag hydroxide-simeth (MAALOX/MYLANTA) 200-200-20 MG/5ML suspension 30 mL  30 mL Oral Q4H PRN Evanna Janann August, NP      . ARIPiprazole (ABILIFY) tablet 5 mg  5 mg Oral BID Nehemiah Settle, MD   5 mg at 11/27/12 7829  . FLUoxetine (PROZAC) capsule 20 mg  20 mg Oral Daily Nehemiah Settle,  MD   20 mg at 11/27/12 0838  . magnesium hydroxide (MILK OF MAGNESIA) suspension 30 mL  30 mL Oral Daily PRN Evanna Janann August, NP      . traZODone (DESYREL) tablet 50 mg  50 mg Oral QHS PRN,MR X 1 Evanna Cori Merry Proud, NP   50 mg at 11/26/12 2007    Lab Results: No  results found for this or any previous visit (from the past 48 hour(s)).  Physical Findings: AIMS: Facial and Oral Movements Muscles of Facial Expression: None, normal Lips and Perioral Area: None, normal Jaw: None, normal Tongue: None, normal,Extremity Movements Upper (arms, wrists, hands, fingers): None, normal Lower (legs, knees, ankles, toes): None, normal, Trunk Movements Neck, shoulders, hips: None, normal, Overall Severity Severity of abnormal movements (highest score from questions above): None, normal Incapacitation due to abnormal movements: None, normal Patient's awareness of abnormal movements (rate only patient's report): No Awareness, Dental Status Current problems with teeth and/or dentures?: No Does patient usually wear dentures?: No  CIWA:  CIWA-Ar Total: 0 COWS:  COWS Total Score: 1  Treatment Plan Summary: Daily contact with patient to assess and evaluate symptoms and progress in treatment Medication management  Plan: 1. Continue crisis management and stabilization. 2. Medication management to reduce current symptoms to base line and improve patient's overall level of functioning 3. Treat health problems as indicated. 4. Develop treatment plan to decrease risk of relapse upon discharge and the need for readmission. 5. Psycho-social education regarding relapse prevention and self care. 6. Health care follow up as needed for medical problems. 7. Continue home medications where appropriate. 8. ELOS: 1 D/C in AM. Orders are already written and samples ordered for in the morning. 9. Patient will need to have SRA completed prior to discharge out.  Medical Decision Making Problem Points:  Established problem, stable/improving (1) Data Points:  Review or order medicine tests (1)  I certify that inpatient services furnished can reasonably be expected to improve the patient's condition.  Rona Ravens. Mashburn RPAC 11:53 AM 11/27/2012  Reviewed the information documented  and agree with the treatment plan.  Mikel Pyon,JANARDHAHA R. 11/27/2012 9:07 PM

## 2012-11-27 NOTE — Progress Notes (Signed)
Adult Psychoeducational Group Note  Date:  11/27/2012 Time:  11:49 AM  Group Topic/Focus:  Relapse Prevention Planning:   The focus of this group is to define relapse and discuss the need for planning to combat relapse.  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   Reynolds Bowl 11/27/2012, 11:49 AM

## 2012-11-27 NOTE — [facility] Group Notes (Signed)
[facility] LCSW Aftercare Discharge Planning Group Note   11/27/2012    8:45 AM  Participation Quality:  Alert and Appropriate   Mood/Affect:  Appropriate, Calm  Depression Rating:  1  Anxiety Rating:  1  Thoughts of Suicide:  Pt denies SI/HI  Will you contract for safety?   Yes  Current AVH:  Pt denies  Plan for Discharge/Comments:  Pt attended discharge planning group and actively participated in group.  CSW provided pt with today's workbook.  Pt will follow up at [facility] for medication management and therapy.  Pt reports that she will live with her grandmother upon d/c, instead of her ex friend.  Pt states that she plans to work on getting her GED and a job.  Pt was smiling while discussing her d/c plans and reports feeling much better since being here.  No further needs voiced by pt at this time.    Transportation Means: Pt reports access to transportation - provided pt with a bus pass  Supports: No supports mentioned at this time  Reyes Ivan, LCSWA 11/27/2012 9:50 AM

## 2012-11-27 NOTE — Progress Notes (Signed)
[facility] Group Notes:  (Nursing/MHT/Case Management/Adjunct)  Date:  11/27/2012  Time:  2000  Type of Therapy:  Psychoeducational Skills  Participation Level:  Minimal  Participation Quality:  Attentive  Affect:  Blunted  Cognitive:  Lacking  Insight:  Lacking  Engagement in Group:  Limited  Modes of Intervention:  Education  Summary of Progress/Problems: The patient described her day as having been a "pretty blessed day". She would not explain any further. Her goal for tomorrow is to schedule some GED testing.   Hazle Coca S 11/27/2012, 9:36 PM

## 2012-11-28 DIAGNOSIS — F411 Generalized anxiety disorder: Secondary | ICD-10-CM

## 2012-11-28 DIAGNOSIS — F1210 Cannabis abuse, uncomplicated: Secondary | ICD-10-CM

## 2012-11-28 DIAGNOSIS — R45851 Suicidal ideations: Secondary | ICD-10-CM

## 2012-11-28 DIAGNOSIS — F332 Major depressive disorder, recurrent severe without psychotic features: Secondary | ICD-10-CM

## 2012-11-28 MED ORDER — FLUOXETINE HCL 20 MG PO CAPS: 20.0000 mg | ORAL_CAPSULE | Freq: Every day | ORAL | Status: DC

## 2012-11-28 MED ORDER — ARIPIPRAZOLE 5 MG PO TABS: 5.0000 mg | ORAL_TABLET | Freq: Two times a day (BID) | ORAL | Status: DC

## 2012-11-28 NOTE — Progress Notes (Signed)
Patient ID: Kimberly Contreras, female   DOB: 08-15-1985, 27 y.o.   MRN: [id number] Pt presents with depressed mood but brightens upon approach. She states '' I'm looking forward to going home. I have the interview at 9 am'' Discharge orders received. Discharge instructions reviewed, copy of AVS provided. Rx provided . Bus Pass provided. Patient denies any SI/HI/A/V Hallucinations and able to verbalize crisis plan, reviewed crisis services. Patient in no acute distress and no acute decompensation noted. Patient was escorted from unit via Clinical research associate and provided escort with security to bus stop at her request.

## 2012-11-30 ENCOUNTER — Encounter (HOSPITAL_COMMUNITY): Payer: Self-pay | Admitting: Emergency Medicine

## 2012-11-30 DIAGNOSIS — R300 Dysuria: Secondary | ICD-10-CM | POA: Insufficient documentation

## 2012-11-30 DIAGNOSIS — R059 Cough, unspecified: Secondary | ICD-10-CM | POA: Insufficient documentation

## 2012-11-30 DIAGNOSIS — N9489 Other specified conditions associated with female genital organs and menstrual cycle: Secondary | ICD-10-CM | POA: Insufficient documentation

## 2012-11-30 DIAGNOSIS — E669 Obesity, unspecified: Secondary | ICD-10-CM | POA: Insufficient documentation

## 2012-11-30 DIAGNOSIS — F17200 Nicotine dependence, unspecified, uncomplicated: Secondary | ICD-10-CM | POA: Insufficient documentation

## 2012-11-30 DIAGNOSIS — R05 Cough: Secondary | ICD-10-CM | POA: Insufficient documentation

## 2012-11-30 NOTE — ED Notes (Signed)
Pt. reports dysuria with vaginal irritation for several days , pt.also reports occasional productive cough for 1 month.

## 2012-11-30 NOTE — ED Notes (Signed)
Unable to give urine specimen at this time at triage , specimen cup given to pt.

## 2012-12-01 ENCOUNTER — Emergency Department (HOSPITAL_COMMUNITY)
Admission: EM | Admit: 2012-12-01 | Discharge: 2012-12-01 | Discharge status: Against Medical Advice | Payer: Self-pay | Attending: Emergency Medicine | Admitting: Emergency Medicine

## 2012-12-01 ENCOUNTER — Encounter (HOSPITAL_COMMUNITY): Payer: Self-pay | Admitting: Emergency Medicine

## 2012-12-01 ENCOUNTER — Emergency Department (HOSPITAL_COMMUNITY)
Admission: EM | Admit: 2012-12-01 | Discharge: 2012-12-01 | Disposition: A | Discharge status: Home / Self Care | Payer: Self-pay | Attending: Emergency Medicine | Admitting: Emergency Medicine

## 2012-12-01 DIAGNOSIS — E669 Obesity, unspecified: Secondary | ICD-10-CM | POA: Insufficient documentation

## 2012-12-01 DIAGNOSIS — F329 Major depressive disorder, single episode, unspecified: Secondary | ICD-10-CM | POA: Insufficient documentation

## 2012-12-01 DIAGNOSIS — F17200 Nicotine dependence, unspecified, uncomplicated: Secondary | ICD-10-CM | POA: Insufficient documentation

## 2012-12-01 DIAGNOSIS — F3289 Other specified depressive episodes: Secondary | ICD-10-CM | POA: Insufficient documentation

## 2012-12-01 DIAGNOSIS — B9689 Other specified bacterial agents as the cause of diseases classified elsewhere: Secondary | ICD-10-CM

## 2012-12-01 DIAGNOSIS — N760 Acute vaginitis: Secondary | ICD-10-CM | POA: Insufficient documentation

## 2012-12-01 DIAGNOSIS — Z79899 Other long term (current) drug therapy: Secondary | ICD-10-CM | POA: Insufficient documentation

## 2012-12-01 HISTORY — DX: Obesity, unspecified: E66.9

## 2012-12-01 LAB — WET PREP, GENITAL

## 2012-12-01 LAB — URINALYSIS, ROUTINE W REFLEX MICROSCOPIC
Glucose, UA: NEGATIVE mg/dL
Leukocytes, UA: NEGATIVE
Nitrite: NEGATIVE
Specific Gravity, Urine: 1.041 — ABNORMAL HIGH (ref 1.005–1.030)
pH: 5.5 (ref 5.0–8.0)

## 2012-12-01 LAB — GC/CHLAMYDIA PROBE AMP: GC Probe RNA: NEGATIVE

## 2012-12-01 MED ORDER — METRONIDAZOLE 500 MG PO TABS: 500.0000 mg | ORAL_TABLET | Freq: Two times a day (BID) | ORAL | Status: DC

## 2012-12-01 NOTE — ED Notes (Signed)
Pt. requesting STD check - vaginal irritation /itching and dysuria for several days .

## 2012-12-01 NOTE — ED Provider Notes (Signed)
Medical screening examination/treatment/procedure(s) were performed by non-physician practitioner and as supervising physician I was immediately available for consultation/collaboration.  Geoffery Lyons, MD 12/01/12 2037

## 2012-12-01 NOTE — ED Notes (Addendum)
Pt states she has a recurring cyst on the inside of her vagina. She states that it itches and she has been incontinent. Pt states that she has been taking OTC UTI medication with no progress. Pt states that her vagina has a bad smell. Pt states she also wants her throat checked because she has been coughing up phlegm.

## 2012-12-01 NOTE — ED Provider Notes (Signed)
CSN: [id number]     Arrival date & time 12/01/12  0610 History   First MD Initiated Contact with Patient 12/01/12 0617     Chief Complaint  Patient presents with  . Vaginal Itching  . Dysuria   (Consider location/radiation/quality/duration/timing/severity/associated sxs/prior Treatment) HPI Comments: Patient is a 27 year old female who presents with a 3 day history of vaginal itching and dysuria. Symptoms started gradually and progressively worsened since the onset. Patient is concerned about having an STD. Symptoms have been constant since the onset. No aggravating/alleviating factors. No other associated symptoms. Patient reports "bumps" on her vagina that she thinks is herpes. Patient has never had herpes previously. She reports a new sexual partner and is concerned.   Patient is a 27 y.o. female presenting with vaginal itching and dysuria.  Vaginal Itching  Dysuria   Past Medical History  Diagnosis Date  . Abscess   . Depression   . Obesity    History reviewed. No pertinent past surgical history. No family history on file. History  Substance Use Topics  . Smoking status: Current Every Day Smoker -- 1.00 packs/day    Types: Cigarettes  . Smokeless tobacco: Never Used  . Alcohol Use: 1.2 oz/week    2 Shots of liquor per week     Comment: on weekends   OB History   Grav Para Term Preterm Abortions TAB SAB Ect Mult Living                 Review of Systems  Genitourinary: Positive for dysuria.       Vaginal itching.   All other systems reviewed and are negative.    Allergies  Review of patient's allergies indicates no known allergies.  Home Medications   Current Outpatient Rx  Name  Route  Sig  Dispense  Refill  . ARIPiprazole (ABILIFY) 5 MG tablet   Oral   Take 1 tablet (5 mg total) by mouth 2 (two) times daily. For anxiety and depression.   60 tablet   0   . FLUoxetine (PROZAC) 20 MG capsule   Oral   Take 1 capsule (20 mg total) by mouth daily. For  depression.   30 capsule   0   . Tetrahydrozoline HCl (VISINE OP)   Both Eyes   Place 1-2 drops into both eyes as needed (dry eyes).          BP 141/84  Pulse 70  Temp(Src) 98.1 F (36.7 C) (Oral)  Resp 18  SpO2 98%  LMP 10/23/2012 Physical Exam  Nursing note and vitals reviewed. Constitutional: She is oriented to person, place, and time. She appears well-developed and well-nourished. No distress.  HENT:  Head: Normocephalic and atraumatic.  Eyes: Conjunctivae and EOM are normal.  Neck: Normal range of motion.  Cardiovascular: Normal rate and regular rhythm.  Exam reveals no gallop and no friction rub.   No murmur heard. Pulmonary/Chest: Effort normal and breath sounds normal. She has no wheezes. She has no rales. She exhibits no tenderness.  Abdominal: Soft. She exhibits no distension. There is no tenderness. There is no rebound and no guarding.  Genitourinary: Vagina normal.  Copious, thick white vaginal discharge. No CMT.   Musculoskeletal: Normal range of motion.  Neurological: She is alert and oriented to person, place, and time. Coordination normal.  Speech is goal-oriented. Moves limbs without ataxia.   Skin: Skin is warm and dry.  Psychiatric: She has a normal mood and affect. Her behavior is normal.  ED Course  Procedures (including critical care time) Labs Review Labs Reviewed  WET PREP, GENITAL - Abnormal; Notable for the following:    Clue Cells Wet Prep HPF POC FEW (*)    WBC, Wet Prep HPF POC FEW (*)    All other components within normal limits  URINALYSIS, ROUTINE W REFLEX MICROSCOPIC - Abnormal; Notable for the following:    Color, Urine AMBER (*)    APPearance CLOUDY (*)    Specific Gravity, Urine 1.041 (*)    Bilirubin Urine SMALL (*)    All other components within normal limits  URINE CULTURE  GC/CHLAMYDIA PROBE AMP   Imaging Review No results found.  MDM   1. BV (bacterial vaginosis)     6:54 AM Urinalysis shows no UTI. Patient's  wet prep pending. Vitals stable and patient afebrile.   7:24 AM Wet prep shows clue cells. Patient will be treated for BV with metronidazole. Vitals stable and patient afebrile. [facility] work up as needed.     Emilia Beck, [place] 12/01/12 [phone]

## 2012-12-02 NOTE — Progress Notes (Signed)
Patient Discharge Instructions:  After Visit Summary (AVS):   Faxed to:  12/02/12 Psychiatric Admission Assessment Note:   Faxed to:  12/02/12 Suicide Risk Assessment - Discharge Assessment:   Faxed to:  12/02/12 Faxed/Sent to the Next Level Care provider:  12/02/12 Faxed to [facility] @ [phone]  Jerelene Redden, 12/02/2012, 1:54 PM

## 2012-12-03 LAB — URINE CULTURE: Colony Count: >=100000

## 2012-12-04 ENCOUNTER — Emergency Department (HOSPITAL_COMMUNITY)
Admission: EM | Admit: 2012-12-04 | Discharge: 2012-12-05 | Disposition: A | Discharge status: Home / Self Care | Payer: Self-pay | Attending: Emergency Medicine | Admitting: Emergency Medicine

## 2012-12-04 ENCOUNTER — Encounter (HOSPITAL_COMMUNITY): Payer: Self-pay | Admitting: Emergency Medicine

## 2012-12-04 DIAGNOSIS — F329 Major depressive disorder, single episode, unspecified: Secondary | ICD-10-CM | POA: Insufficient documentation

## 2012-12-04 DIAGNOSIS — Y929 Unspecified place or not applicable: Secondary | ICD-10-CM | POA: Insufficient documentation

## 2012-12-04 DIAGNOSIS — F3289 Other specified depressive episodes: Secondary | ICD-10-CM | POA: Insufficient documentation

## 2012-12-04 DIAGNOSIS — IMO0002 Reserved for concepts with insufficient information to code with codable children: Secondary | ICD-10-CM | POA: Insufficient documentation

## 2012-12-04 DIAGNOSIS — Z79899 Other long term (current) drug therapy: Secondary | ICD-10-CM | POA: Insufficient documentation

## 2012-12-04 DIAGNOSIS — Y9389 Activity, other specified: Secondary | ICD-10-CM | POA: Insufficient documentation

## 2012-12-04 DIAGNOSIS — X58XXXA Exposure to other specified factors, initial encounter: Secondary | ICD-10-CM | POA: Insufficient documentation

## 2012-12-04 DIAGNOSIS — M545 Low back pain: Secondary | ICD-10-CM

## 2012-12-04 DIAGNOSIS — F17200 Nicotine dependence, unspecified, uncomplicated: Secondary | ICD-10-CM | POA: Insufficient documentation

## 2012-12-04 DIAGNOSIS — Z872 Personal history of diseases of the skin and subcutaneous tissue: Secondary | ICD-10-CM | POA: Insufficient documentation

## 2012-12-04 NOTE — ED Notes (Signed)
Pt. reports low back pain onset today , pt. stated somebody jumped on her back while playing around today pain worse with movement and certain positions , denies urinary discomfort.

## 2012-12-04 NOTE — Progress Notes (Signed)
ED Antimicrobial Stewardship Positive Culture Follow Up   Kimberly Contreras is an 27 y.o. female who presented to [facility] on 12/01/2012 with a chief complaint of vaginal itching, dysuria  Chief Complaint  Patient presents with  . Dysuria  . Cough    Recent Results (from the past 720 hour(s))  URINE CULTURE     Status: None   Collection Time    12/01/12  6:32 AM      Result Value Range Status   Specimen Description URINE, CLEAN CATCH   Final   Special Requests Normal   Final   Culture  Setup Time     Final   Value: 12/01/2012 08:09     Performed at [facility] Count     Final   Value: >=100,000 COLONIES/ML     Performed at [facility]   Culture     Final   Value: ENTEROCOCCUS SPECIES     Performed at [facility]   Report Status 12/03/2012 FINAL   Final   Organism ID, Bacteria ENTEROCOCCUS SPECIES   Final  WET PREP, GENITAL     Status: Abnormal   Collection Time    12/01/12  6:56 AM      Result Value Range Status   Yeast Wet Prep HPF POC NONE SEEN  NONE SEEN Final   Trich, Wet Prep NONE SEEN  NONE SEEN Final   Clue Cells Wet Prep HPF POC FEW (*) NONE SEEN Final   WBC, Wet Prep HPF POC FEW (*) NONE SEEN Final  GC/CHLAMYDIA PROBE AMP     Status: None   Collection Time    12/01/12  6:57 AM      Result Value Range Status   CT Probe RNA NEGATIVE  NEGATIVE Final   GC Probe RNA NEGATIVE  NEGATIVE Final   Comment: (NOTE)                                                                                              **Normal Reference Range: Negative**          Assay performed using the Gen-Probe APTIMA COMBO2 (R) Assay.     Acceptable specimen types for this assay include APTIMA Swabs (Unisex,     endocervical, urethral, or vaginal), first void urine, and ThinPrep     liquid based cytology samples.     Performed at [facility]    [x]  Patient discharged originally without antimicrobial agent and treatment is now indicated  27 y.o. F  who presented with vaginal itching and dysuria. Work-up revealed BV and the patient was sent home on Flagyl. Pt also noted to have UTI -- no abx given for this.  New antibiotic prescription: Macrobid 100 mg bid x 7 days (continue Flagyl as prescribed)  ED Provider: Roxy Horseman, PA-C   [name] 12/04/2012, 10:17 AM Infectious Diseases Pharmacist Phone# [phone]

## 2012-12-04 NOTE — ED Notes (Signed)
Post ED Visit - Positive Culture Follow-up: Successful Patient Follow-Up  Culture assessed and recommendations reviewed by: []  Wes Dulaney, Pharm.D., BCPS []  Celedonio Miyamoto, Pharm.D., BCPS [x]  Georgina Pillion, [address].D., BCPS []  [place], [address].D., BCPS, AAHIVP []  Estella Husk, Pharm.D., BCPS, AAHIVP  Positive urine ulture  []  Patient discharged without antimicrobial prescription and treatment is now indicated [x]  Organism is resistant to prescribed ED discharge antimicrobial []  Patient with positive blood cultures  Changes discussed with ED provider: Roxy Horseman New antibiotic prescription Macrobid 100 mg BID x 7 days cont flagyl as prescribed Larena Sox 12/04/2012, 5:02 PM

## 2012-12-05 ENCOUNTER — Emergency Department (HOSPITAL_COMMUNITY): Payer: Self-pay

## 2012-12-05 ENCOUNTER — Telehealth (HOSPITAL_COMMUNITY): Payer: Self-pay | Admitting: *Deleted

## 2012-12-05 MED ORDER — TRAMADOL HCL 50 MG PO TABS: 50.0000 mg | ORAL_TABLET | Freq: Four times a day (QID) | ORAL | Status: DC | PRN

## 2012-12-05 MED ORDER — TRAMADOL HCL 50 MG PO TABS
50.0000 mg | ORAL_TABLET | Freq: Once | ORAL | Status: AC
  Administered 2012-12-05: 50 mg via ORAL
  Filled 2012-12-05: qty 1

## 2012-12-05 MED ORDER — CYCLOBENZAPRINE HCL 10 MG PO TABS
5.0000 mg | ORAL_TABLET | Freq: Once | ORAL | Status: AC
  Administered 2012-12-05: 5 mg via ORAL
  Filled 2012-12-05: qty 1

## 2012-12-05 MED ORDER — CYCLOBENZAPRINE HCL 5 MG PO TABS: 5.0000 mg | ORAL_TABLET | Freq: Three times a day (TID) | ORAL | Status: DC | PRN

## 2012-12-05 NOTE — ED Notes (Signed)
Patient transported to X-ray 

## 2012-12-05 NOTE — ED Provider Notes (Signed)
Medical screening examination/treatment/procedure(s) were performed by non-physician practitioner and as supervising physician I was immediately available for consultation/collaboration.   Laray Anger, DO 12/05/12 [phone]

## 2012-12-05 NOTE — ED Provider Notes (Signed)
CSN: [id number]     Arrival date & time 12/04/12  2312 History   First MD Initiated Contact with Patient 12/05/12 0022     Chief Complaint  Patient presents with  . Back Pain   (Consider location/radiation/quality/duration/timing/severity/associated sxs/prior Treatment) Patient is a 27 y.o. female presenting with back pain. The history is provided by the patient.  Back Pain Location:  Thoracic spine Quality:  Aching Radiates to:  Does not radiate Pain severity:  Moderate Pain is:  Same all the time Timing:  Constant Progression:  Unchanged Chronicity:  New Context comment:  Someone jumped on her back Relieved by:  Ibuprofen Worsened by:  Ambulation Ineffective treatments:  Ibuprofen Associated symptoms: no abdominal pain, no abdominal swelling, no bladder incontinence, no bowel incontinence, no dysuria, no fever, no leg pain and no numbness     Past Medical History  Diagnosis Date  . Abscess   . Depression   . Obesity    History reviewed. No pertinent past surgical history. No family history on file. History  Substance Use Topics  . Smoking status: Current Every Day Smoker -- 1.00 packs/day    Types: Cigarettes  . Smokeless tobacco: Never Used  . Alcohol Use: 1.2 oz/week    2 Shots of liquor per week     Comment: on weekends   OB History   Grav Para Term Preterm Abortions TAB SAB Ect Mult Living                 Review of Systems  Constitutional: Negative for fever.  Gastrointestinal: Negative for nausea, abdominal pain, constipation and bowel incontinence.  Genitourinary: Negative for bladder incontinence and dysuria.  Musculoskeletal: Positive for back pain.  Neurological: Negative for numbness.  All other systems reviewed and are negative.    Allergies  Review of patient's allergies indicates no known allergies.  Home Medications   Current Outpatient Rx  Name  Route  Sig  Dispense  Refill  . metroNIDAZOLE (FLAGYL) 500 MG tablet   Oral   Take 1  tablet (500 mg total) by mouth 2 (two) times daily.   14 tablet   0   . ARIPiprazole (ABILIFY) 5 MG tablet   Oral   Take 1 tablet (5 mg total) by mouth 2 (two) times daily. For anxiety and depression.   60 tablet   0   . cyclobenzaprine (FLEXERIL) 5 MG tablet   Oral   Take 1 tablet (5 mg total) by mouth 3 (three) times daily as needed for muscle spasms.   30 tablet   0   . FLUoxetine (PROZAC) 20 MG capsule   Oral   Take 1 capsule (20 mg total) by mouth daily. For depression.   30 capsule   0   . traMADol (ULTRAM) 50 MG tablet   Oral   Take 1 tablet (50 mg total) by mouth every 6 (six) hours as needed for pain.   30 tablet   0    BP 129/59  Pulse 78  Temp(Src) 97.5 F (36.4 C) (Oral)  Resp 16  SpO2 100%  LMP 10/23/2012 Physical Exam  Nursing note and vitals reviewed. Constitutional: She is oriented to person, place, and time. She appears well-developed and well-nourished.  Morbidly obese  HENT:  Head: Normocephalic.  Eyes: Pupils are equal, round, and reactive to light.  Neck: Normal range of motion.  Cardiovascular: Normal rate.   Pulmonary/Chest: Effort normal.  Musculoskeletal: She exhibits tenderness.       Back:  Neurological:  She is alert and oriented to person, place, and time.  Skin: Skin is warm.    ED Course  Procedures (including critical care time) Labs Review Labs Reviewed - No data to display Imaging Review Dg Thoracic Spine 2 View  12/05/2012   *RADIOLOGY REPORT*  Clinical Data: Back pain.  Kicked in back today.  THORACIC SPINE - 2 VIEW  Comparison: None.  Findings: Metallic BB was placed at the site of patient pain.  This BB projects posterior to the L1 level.  The T1 and T2 vertebral bodies are not included on this exam. The L1-L4 vertebral bodies are included on the exam.  There is an approximately 15 degrees convex left scoliotic curvature of the upper thoracic spine.  There is a 20 degrees convex right curvature of the mid to lower  thoracic spine.  No acute fractures identified.  Bony detail on the lateral projection is somewhat limited by patient body habitus.  The L1, L2, and L3 vertebral bodies appear normal in height and alignment.  IMPRESSION:  1.  No acute bony abnormality of the thoracic spine.  Please note the T1 and T2 were not included in the image, as the patient's area of pain was marked below the level of the T12 vertebral body. 2.  Mild to moderate thoracic spine scoliosis.   Original Report Authenticated By: Britta Mccreedy, M.D.    MDM   1. Low back pain     Patient has been started on Ultram, and Flexeril.  X-rays have been reviewed.  There is new bony pathology    Arman Filter, NP 12/05/12 [phone]

## 2012-12-06 ENCOUNTER — Telehealth (HOSPITAL_COMMUNITY): Payer: Self-pay | Admitting: Emergency Medicine

## 2012-12-08 NOTE — ED Notes (Signed)
Unable to contact via phone letter sent to [facility] address. 

## 2013-01-07 NOTE — Discharge Summary (Signed)
Physician Discharge Summary Note This is a late entry. This patient's discharge was inadvertently missed.  Patient:  Kimberly Contreras is an 27 y.o., female MRN:  [id number] DOB:  [birth year] Patient phone:  [phone] (home)  Patient address:   [address]  [place] [address],   Date of Admission:  11/24/2012 Date of Discharge:  11/27/2012  Reason for Admission: Depression with suicidal ideation  Discharge Diagnoses: Principal Problem:   MDD (major depressive disorder), recurrent severe, without psychosis Active Problems:   Generalized anxiety disorder   Suicidal ideation  ROS  DSM5: Schizophrenia Disorders:  Obsessive-Compulsive Disorders:  Trauma-Stressor Disorders:  Substance/Addictive Disorders: Cannabis Use Disorder - Moderate 9304.30)  Depressive Disorders: Major Depressive Disorder - Severe (296.23)  AXIS I: Generalized Anxiety Disorder, Major Depression, Recurrent severe and Cannabis abuse and suicidal ideation  AXIS II: Deferred  AXIS III:  Past Medical History   Diagnosis  Date   .  Abscess    .  Depression     AXIS IV: economic problems, occupational problems, other psychosocial or environmental problems, problems related to social environment and problems with primary support group  AXIS V: 41-50 serious symptoms   Level of Care:  OP  Hospital Course:  Kimberly Contreras is an 27 y.o. Single AA female admitted voluntarily and emergently from [facility] for increased depression and suicidal ideations. She was asked to move out of her BF's house recently after living with him for a year. She is financially dependent upon him since she quit her job at [employer]. She was also noted to be smoking marijuana daily.       Kimberly Contreras was admitted to the unit and evaluated and her symptoms were identified. Medication management was initiated and she was encouraged to participate in unit programming. She was started on Fluoxetine and Aripiprazole for her symptoms. Kimberly Contreras reported no side  effects and responded well to the medication.       Kimberly Contreras was not interested in participating in unit programming and signed a request for discharge on 11/25/2012. She was encouraged to attend groups a second time. She did attend several groups after this but her participation was minimal. Kimberly Contreras did not invest in her treatment and requested to be discharged each day.        By the 3rd day of admission Kimberly Contreras reported that she had a new job she was to start in the morning and was ready to go home. She denied SI/HI or AVH. She denied any side effects to the medication. She was discharged out the following morning early to go directly to her new job.         Consults:  None  Significant Diagnostic Studies:  labs: CBC,CMP, UA, UDS  Discharge Vitals:   Blood pressure 115/72, pulse 63, temperature 98.1 F (36.7 C), temperature source Oral, resp. rate 20, height 5\' 11"  (1.803 m), weight 134.718 kg (297 lb), last menstrual period 11/15/2012. Body mass index is 41.44 kg/(m^2). Lab Results:   No results found for this or any previous visit (from the past 72 hour(s)).  Physical Findings: AIMS: Facial and Oral Movements Muscles of Facial Expression: None, normal Lips and Perioral Area: None, normal Jaw: None, normal Tongue: None, normal,Extremity Movements Upper (arms, wrists, hands, fingers): None, normal Lower (legs, knees, ankles, toes): None, normal, Trunk Movements Neck, shoulders, hips: None, normal, Overall Severity Severity of abnormal movements (highest score from questions above): None, normal Incapacitation due to abnormal movements: None, normal Patient's awareness of abnormal movements (rate only  patient's report): No Awareness, Dental Status Current problems with teeth and/or dentures?: No Does patient usually wear dentures?: No  CIWA:  CIWA-Ar Total: 0 COWS:  COWS Total Score: 1  Psychiatric Specialty Exam: See Psychiatric Specialty Exam and Suicide Risk Assessment completed by  Attending Physician prior to discharge.  Discharge destination:  Home  Is patient on multiple antipsychotic therapies at discharge:  No   Has Patient had three or more failed trials of antipsychotic monotherapy by history:  No  Recommended Plan for Multiple Antipsychotic Therapies: NA  Discharge Orders   Future Orders Complete By Expires   Diet - low sodium heart healthy  As directed    Discharge instructions  As directed    Comments:     Take all of your medications as directed. Be sure to keep all of your follow up appointments.  If you are unable to keep your follow up appointment, call your Doctor's office to let them know, and reschedule.  Make sure that you have enough medication to last until your appointment. Be sure to get plenty of rest. Going to bed at the same time each night will help. Try to avoid sleeping during the day.  Increase your activity as tolerated. Regular exercise will help you to sleep better and improve your mental health. Eating a heart healthy diet is recommended. Try to avoid salty or fried foods. Be sure to avoid all alcohol and illegal drugs.   Increase activity slowly  As directed        Medication List       Indication   ARIPiprazole 5 MG tablet  Commonly known as:  ABILIFY  Take 1 tablet (5 mg total) by mouth 2 (two) times daily. For anxiety and depression.   Indication:  Major Depressive Disorder, MDD     FLUoxetine 20 MG capsule  Commonly known as:  PROZAC  Take 1 capsule (20 mg total) by mouth daily. For depression.   Indication:  Depression, Trouble Sleeping           Follow-up Information   Follow up with [facility] On 12/02/2012. (Walk in Monday through Friday between 8 AM-9AM for hospital followup/medication management. )    Contact information:   [address], [place] [address] Phone: [phone] Fax: [phone]      Follow-up recommendations:   Activities: Resume activity as tolerated. Diet: Heart healthy low sodium  diet Tests: Follow up testing will be determined by your out patient provider. Comments:    Total Discharge Time:  Less than 30 minutes.  Signed: Rona Ravens. Contreras RPAC 12:50 PM 01/07/2013  Patient was seen for psychiatric evaluation, suicide risk assessment and discussed the case with the treatment team. Disposition plans were made before discharged and reviewed the information documented and agree with the treatment plan.  Kimberly Contreras,JANARDHAHA R. 01/07/2013 1:18 PM

## 2013-04-01 ENCOUNTER — Emergency Department (HOSPITAL_COMMUNITY)
Admission: EM | Admit: 2013-04-01 | Discharge: 2013-04-02 | Disposition: A | Discharge status: Other Acute Inpatient Hospital | Payer: No Typology Code available for payment source | Attending: Emergency Medicine | Admitting: Emergency Medicine

## 2013-04-01 ENCOUNTER — Encounter (HOSPITAL_COMMUNITY): Payer: Self-pay | Admitting: Emergency Medicine

## 2013-04-01 DIAGNOSIS — F329 Major depressive disorder, single episode, unspecified: Secondary | ICD-10-CM | POA: Insufficient documentation

## 2013-04-01 DIAGNOSIS — F411 Generalized anxiety disorder: Secondary | ICD-10-CM | POA: Insufficient documentation

## 2013-04-01 DIAGNOSIS — F17200 Nicotine dependence, unspecified, uncomplicated: Secondary | ICD-10-CM | POA: Insufficient documentation

## 2013-04-01 DIAGNOSIS — F32A Depression, unspecified: Secondary | ICD-10-CM

## 2013-04-01 DIAGNOSIS — Z3202 Encounter for pregnancy test, result negative: Secondary | ICD-10-CM | POA: Insufficient documentation

## 2013-04-01 DIAGNOSIS — E669 Obesity, unspecified: Secondary | ICD-10-CM | POA: Insufficient documentation

## 2013-04-01 DIAGNOSIS — F3289 Other specified depressive episodes: Secondary | ICD-10-CM | POA: Insufficient documentation

## 2013-04-01 DIAGNOSIS — F1210 Cannabis abuse, uncomplicated: Secondary | ICD-10-CM | POA: Insufficient documentation

## 2013-04-01 HISTORY — DX: Anxiety disorder, unspecified: F41.9

## 2013-04-01 LAB — COMPREHENSIVE METABOLIC PANEL
ALBUMIN: 4 g/dL (ref 3.5–5.2)
ALT: 23 U/L (ref 0–35)
AST: 23 U/L (ref 0–37)
Alkaline Phosphatase: 65 U/L (ref 39–117)
BILIRUBIN TOTAL: 0.5 mg/dL (ref 0.3–1.2)
BUN: 7 mg/dL (ref 6–23)
CHLORIDE: 97 meq/L (ref 96–112)
CO2: 21 mEq/L (ref 19–32)
CREATININE: 0.57 mg/dL (ref 0.50–1.10)
Calcium: 9.3 mg/dL (ref 8.4–10.5)
GFR calc Af Amer: >90 mL/min (ref >90)
GFR calc non Af Amer: >90 mL/min (ref >90)
Glucose, Bld: 71 mg/dL (ref 70–99)
Potassium: 4.1 mEq/L (ref 3.7–5.3)
Sodium: 135 mEq/L — ABNORMAL LOW (ref 137–147)
Total Protein: 8 g/dL (ref 6.0–8.3)

## 2013-04-01 LAB — ACETAMINOPHEN LEVEL

## 2013-04-01 LAB — CBC
HCT: 42.6 % (ref 36.0–46.0)
Hemoglobin: 14.1 g/dL (ref 12.0–15.0)
MCH: 28.5 pg (ref 26.0–34.0)
MCHC: 33.1 g/dL (ref 30.0–36.0)
MCV: 86.2 fL (ref 78.0–100.0)
PLATELETS: 309 10*3/uL (ref 150–400)
RBC: 4.94 MIL/uL (ref 3.87–5.11)
RDW: 14.5 % (ref 11.5–15.5)
WBC: 8.3 10*3/uL (ref 4.0–10.5)

## 2013-04-01 LAB — POCT PREGNANCY, URINE: Preg Test, Ur: NEGATIVE

## 2013-04-01 LAB — RAPID URINE DRUG SCREEN, HOSP PERFORMED
AMPHETAMINES: NOT DETECTED
BENZODIAZEPINES: NOT DETECTED
Barbiturates: NOT DETECTED
COCAINE: NOT DETECTED
Opiates: NOT DETECTED
Tetrahydrocannabinol: POSITIVE — AB

## 2013-04-01 LAB — SALICYLATE LEVEL

## 2013-04-01 LAB — ETHANOL: Alcohol, Ethyl (B): <11 mg/dL (ref 0–11)

## 2013-04-01 MED ORDER — IBUPROFEN 200 MG PO TABS: 600.0000 mg | ORAL_TABLET | Freq: Three times a day (TID) | ORAL | Status: DC | PRN

## 2013-04-01 MED ORDER — ALUM & MAG HYDROXIDE-SIMETH 200-200-20 MG/5ML PO SUSP: 30.0000 mL | ORAL | Status: DC | PRN

## 2013-04-01 MED ORDER — LORAZEPAM 1 MG PO TABS
1.0000 mg | ORAL_TABLET | Freq: Three times a day (TID) | ORAL | Status: DC | PRN
  Administered 2013-04-01 – 2013-04-02 (×2): 1 mg via ORAL
  Filled 2013-04-01 (×2): qty 1

## 2013-04-01 MED ORDER — ONDANSETRON HCL 4 MG PO TABS: 4.0000 mg | ORAL_TABLET | Freq: Three times a day (TID) | ORAL | Status: DC | PRN

## 2013-04-01 MED ORDER — NICOTINE 21 MG/24HR TD PT24
21.0000 mg | MEDICATED_PATCH | Freq: Every day | TRANSDERMAL | Status: DC
  Administered 2013-04-01 – 2013-04-02 (×2): 21 mg via TRANSDERMAL
  Filled 2013-04-01 (×2): qty 1

## 2013-04-01 MED ORDER — ZOLPIDEM TARTRATE 5 MG PO TABS
5.0000 mg | ORAL_TABLET | Freq: Every evening | ORAL | Status: DC | PRN
  Administered 2013-04-01: 5 mg via ORAL
  Filled 2013-04-01: qty 1

## 2013-04-01 NOTE — ED Notes (Signed)
Pt transferred from triage, presents with complaint of depression, unable to concentrate, pt reports she feels happy & sad, experiencing crying spells and hurting people she loves.  Pt reports she drinks a fifth of vodka per day when she can and admits to occasional marijuana use.  Denies SI or HI.  Reports auditory hallucinations, calling her stupid, dumb and ignorant.  Admits to previous psych history, Rx Abilify, has not taken for past month.  Feeling hopeless.  Pt calm & cooperative.

## 2013-04-01 NOTE — ED Notes (Signed)
Pt has in belonging bag:  Black shoes, black socks, black zipped up jacket, black t-shirt, white bra, blue jeans, blue underwear, [place] ID card, [place] ID card, blue duffle bag with clothes.

## 2013-04-01 NOTE — ED Provider Notes (Signed)
CSN: [id number]     Arrival date & time 04/01/13  2051 History   First MD Initiated Contact with Patient 04/01/13 2126     Chief Complaint  Patient presents with  . Medical Clearance   HPI  History provided by the patient. Patient presents with complaints of severe depression and requests to speak with a psychiatrist. Patient reports past history of severe depression previously she was treated with Abilify. She says she has not been taking this and recently has had a rough time with relationships. She has become very saddened and depressed. She reports having some foci in the past but denies any SI today. No prior suicide attempts. Denies any HI. She does report some frequent alcohol use with drinking nearly a fifth of vodka almost nightly. Denies any alcohol use today. Denies any history of withdrawal symptoms. Denies any other drug use. No other aggravating or alleviating factors. No other associated symptoms.   Past Medical History  Diagnosis Date  . Abscess   . Depression   . Obesity    History reviewed. No pertinent past surgical history. History reviewed. No pertinent family history. History  Substance Use Topics  . Smoking status: Current Every Day Smoker -- 1.00 packs/day    Types: Cigarettes  . Smokeless tobacco: Never Used  . Alcohol Use: 1.2 oz/week    2 Shots of liquor per week     Comment: on weekends   OB History   Grav Para Term Preterm Abortions TAB SAB Ect Mult Living                 Review of Systems  Constitutional: Negative for fever.  Psychiatric/Behavioral: Positive for dysphoric mood. Negative for suicidal ideas. The patient is nervous/anxious.   All other systems reviewed and are negative.    Allergies  Review of patient's allergies indicates no known allergies.  Home Medications  No current outpatient prescriptions on file. BP 142/63  Pulse 98  Temp(Src) 98.9 F (37.2 C) (Oral)  Resp 20  SpO2 98%  LMP 03/03/2013 Physical Exam  Nursing note  and vitals reviewed. Constitutional: She is oriented to person, place, and time. She appears well-developed and well-nourished. No distress.  HENT:  Head: Normocephalic.  Cardiovascular: Normal rate and regular rhythm.   Pulmonary/Chest: Effort normal and breath sounds normal. No respiratory distress. She has no wheezes. She has no rales.  Musculoskeletal: Normal range of motion.  Neurological: She is alert and oriented to person, place, and time.  Skin: Skin is warm and dry. No rash noted.  Psychiatric: Her behavior is normal. Thought content is not paranoid. She exhibits a depressed mood. She expresses no homicidal and no suicidal ideation.  Tearful    ED Course  Procedures   DIAGNOSTIC STUDIES: Oxygen Saturation is 98% on room air.    COORDINATION OF CARE:  Nursing notes reviewed. Vital signs reviewed. Initial pt interview and examination performed.   9:45 PM-patient seen and evaluated. She appears sad tearful and depressed. Denies SI or HI. Denies any alcohol or drug use this evening. She is requesting help with severe depression and requesting psychiatry consult. Discussed work up plan with pt at bedside, which includes medical screening. Pt agrees with plan.  Psychiatric holding orders in place. TTS consult placed.   Results for orders placed during the hospital encounter of 04/01/13  ACETAMINOPHEN LEVEL      Result Value Range   Acetaminophen (Tylenol), Serum <15.0  10 - 30 ug/mL  CBC  Result Value Range   WBC 8.3  4.0 - 10.5 K/uL   RBC 4.94  3.87 - 5.11 MIL/uL   Hemoglobin 14.1  12.0 - 15.0 g/dL   HCT 16.1  09.6 - 04.5 %   MCV 86.2  78.0 - 100.0 fL   MCH 28.5  26.0 - 34.0 pg   MCHC 33.1  30.0 - 36.0 g/dL   RDW 40.9  81.1 - 91.4 %   Platelets 309  150 - 400 K/uL  COMPREHENSIVE METABOLIC PANEL      Result Value Range   Sodium 135 (*) 137 - 147 mEq/L   Potassium 4.1  3.7 - 5.3 mEq/L   Chloride 97  96 - 112 mEq/L   CO2 21  19 - 32 mEq/L   Glucose, Bld 71  70 -  99 mg/dL   BUN 7  6 - 23 mg/dL   Creatinine, Ser 7.82  0.50 - 1.10 mg/dL   Calcium 9.3  8.4 - 95.6 mg/dL   Total Protein 8.0  6.0 - 8.3 g/dL   Albumin 4.0  3.5 - 5.2 g/dL   AST 23  0 - 37 U/L   ALT 23  0 - 35 U/L   Alkaline Phosphatase 65  39 - 117 U/L   Total Bilirubin 0.5  0.3 - 1.2 mg/dL   GFR calc non Af Amer >90  >90 mL/min   GFR calc Af Amer >90  >90 mL/min  ETHANOL      Result Value Range   Alcohol, Ethyl (B) <11  0 - 11 mg/dL  SALICYLATE LEVEL      Result Value Range   Salicylate Lvl <2.0 (*) 2.8 - 20.0 mg/dL  URINE RAPID DRUG SCREEN (HOSP PERFORMED)      Result Value Range   Opiates NONE DETECTED  NONE DETECTED   Cocaine NONE DETECTED  NONE DETECTED   Benzodiazepines NONE DETECTED  NONE DETECTED   Amphetamines NONE DETECTED  NONE DETECTED   Tetrahydrocannabinol POSITIVE (*) NONE DETECTED   Barbiturates NONE DETECTED  NONE DETECTED  POCT PREGNANCY, URINE      Result Value Range   Preg Test, Ur NEGATIVE  NEGATIVE    MDM   1. Depression        Angus Seller, PA-C 04/01/13 2338

## 2013-04-01 NOTE — ED Notes (Signed)
Pt arrived to the Ed with a need for medical clearance.  Pt states she is in a failing relationship.  Pt states she has severe mood swings.  Pt is unclear as to why she came to the ED for help at this particular time.  Pt states she has a psychological history and was prescribed abilify but stopped taking it after a week.  Pt is tearful in triage and states she doesn't know why she is tearful.

## 2013-04-02 ENCOUNTER — Inpatient Hospital Stay (HOSPITAL_COMMUNITY)
Admission: AD | Admit: 2013-04-02 | Discharge: 2013-04-07 | DRG: 885 | Disposition: A | Discharge status: Home / Self Care | Payer: No Typology Code available for payment source | Source: Intra-hospital | Attending: Psychiatry | Admitting: Psychiatry

## 2013-04-02 ENCOUNTER — Encounter (HOSPITAL_COMMUNITY): Payer: Self-pay | Admitting: *Deleted

## 2013-04-02 DIAGNOSIS — F333 Major depressive disorder, recurrent, severe with psychotic symptoms: Principal | ICD-10-CM | POA: Diagnosis present

## 2013-04-02 DIAGNOSIS — F3289 Other specified depressive episodes: Secondary | ICD-10-CM

## 2013-04-02 DIAGNOSIS — F17200 Nicotine dependence, unspecified, uncomplicated: Secondary | ICD-10-CM | POA: Diagnosis present

## 2013-04-02 DIAGNOSIS — F1994 Other psychoactive substance use, unspecified with psychoactive substance-induced mood disorder: Secondary | ICD-10-CM

## 2013-04-02 DIAGNOSIS — N390 Urinary tract infection, site not specified: Secondary | ICD-10-CM | POA: Diagnosis present

## 2013-04-02 DIAGNOSIS — R45851 Suicidal ideations: Secondary | ICD-10-CM

## 2013-04-02 DIAGNOSIS — F1910 Other psychoactive substance abuse, uncomplicated: Secondary | ICD-10-CM

## 2013-04-02 DIAGNOSIS — G4700 Insomnia, unspecified: Secondary | ICD-10-CM | POA: Diagnosis present

## 2013-04-02 DIAGNOSIS — F29 Unspecified psychosis not due to a substance or known physiological condition: Secondary | ICD-10-CM | POA: Diagnosis present

## 2013-04-02 DIAGNOSIS — Z23 Encounter for immunization: Secondary | ICD-10-CM

## 2013-04-02 DIAGNOSIS — F411 Generalized anxiety disorder: Secondary | ICD-10-CM

## 2013-04-02 DIAGNOSIS — F1210 Cannabis abuse, uncomplicated: Secondary | ICD-10-CM | POA: Diagnosis present

## 2013-04-02 DIAGNOSIS — F410 Panic disorder [episodic paroxysmal anxiety] without agoraphobia: Secondary | ICD-10-CM | POA: Diagnosis present

## 2013-04-02 DIAGNOSIS — F329 Major depressive disorder, single episode, unspecified: Secondary | ICD-10-CM

## 2013-04-02 DIAGNOSIS — L0591 Pilonidal cyst without abscess: Secondary | ICD-10-CM | POA: Diagnosis present

## 2013-04-02 DIAGNOSIS — F332 Major depressive disorder, recurrent severe without psychotic features: Secondary | ICD-10-CM | POA: Diagnosis present

## 2013-04-02 MED ORDER — NICOTINE 21 MG/24HR TD PT24
21.0000 mg | MEDICATED_PATCH | Freq: Every day | TRANSDERMAL | Status: DC
  Administered 2013-04-03 – 2013-04-06 (×4): 21 mg via TRANSDERMAL
  Filled 2013-04-02 (×9): qty 1

## 2013-04-02 MED ORDER — PNEUMOCOCCAL VAC POLYVALENT 25 MCG/0.5ML IJ INJ
0.5000 mL | INJECTION | INTRAMUSCULAR | Status: AC
  Administered 2013-04-05: 0.5 mL via INTRAMUSCULAR

## 2013-04-02 MED ORDER — MAGNESIUM HYDROXIDE 400 MG/5ML PO SUSP: 30.0000 mL | Freq: Every day | ORAL | Status: DC | PRN

## 2013-04-02 MED ORDER — LORAZEPAM 1 MG PO TABS
1.0000 mg | ORAL_TABLET | Freq: Three times a day (TID) | ORAL | Status: DC | PRN
  Administered 2013-04-02 – 2013-04-03 (×2): 1 mg via ORAL
  Filled 2013-04-02 (×2): qty 1

## 2013-04-02 MED ORDER — ALUM & MAG HYDROXIDE-SIMETH 200-200-20 MG/5ML PO SUSP: 30.0000 mL | ORAL | Status: DC | PRN

## 2013-04-02 MED ORDER — ONDANSETRON HCL 4 MG PO TABS
4.0000 mg | ORAL_TABLET | Freq: Three times a day (TID) | ORAL | Status: DC | PRN
Start: 2013-04-02 — End: 2013-04-07

## 2013-04-02 MED ORDER — INFLUENZA VAC SPLIT QUAD 0.5 ML IM SUSP
0.5000 mL | INTRAMUSCULAR | Status: AC
  Administered 2013-04-05: 0.5 mL via INTRAMUSCULAR
  Filled 2013-04-02: qty 0.5

## 2013-04-02 MED ORDER — ZOLPIDEM TARTRATE 5 MG PO TABS
5.0000 mg | ORAL_TABLET | Freq: Every evening | ORAL | Status: DC | PRN
  Administered 2013-04-02: 5 mg via ORAL
  Filled 2013-04-02: qty 1

## 2013-04-02 MED ORDER — IBUPROFEN 200 MG PO TABS: 600.0000 mg | ORAL_TABLET | Freq: Three times a day (TID) | ORAL | Status: DC | PRN

## 2013-04-02 MED ORDER — ACETAMINOPHEN 325 MG PO TABS
650.0000 mg | ORAL_TABLET | Freq: Four times a day (QID) | ORAL | Status: DC | PRN
  Administered 2013-04-02: 650 mg via ORAL
  Filled 2013-04-02: qty 2

## 2013-04-02 NOTE — BH Assessment (Signed)
Patient accepted by Cheryl FlashJosephine Nwoko, NP. Attending MD Akintayo, bed assigned 407-1.

## 2013-04-02 NOTE — Progress Notes (Signed)
Patient ID: Kimberly Contreras, female   DOB: Jul 13, 1985, 28 y.o.   MRN: [id number] [facility] . Pt reports '' I'm depressed and I've been having hallucinations, hearing voices. '' Patient reports recent break up with boyfriend and states '' I've been on the streets the last three days since we broke up '' Patient reports drinking 1/5 vodka and smoking occasional THC. Pt also reports vague passive SI, but verbally contracts for safety. Pt skin assessed, no skin issues or contraband found. Pt oriented to unit and ward rules. Meal and snacks given. Will continue to monitor q 15 minutes for safety.

## 2013-04-02 NOTE — Progress Notes (Signed)
[facility] provided pt with a [employer] Orange Card application, highlighting [facility].  °

## 2013-04-02 NOTE — Consult Note (Signed)
[facility] Psychiatry Consult   Reason for Consult:  Patient reports +AH in the context of noncompliance; self-medicating with etoh/thc; depression and anxious Referring Physician:  EDP Kimberly Contreras is an 28 y.o. female.  Assessment: AXIS I:  Anxiety Disorder NOS, Depressive Disorder NOS, Psychotic Disorder NOS and Substance Induced Mood Disorder AXIS II:  Deferred AXIS III:   Past Medical History  Diagnosis Date  . Abscess   . Depression   . Obesity   . Anxiety    AXIS IV:  economic problems, educational problems, housing problems, occupational problems, other psychosocial or environmental problems, problems related to legal system/crime, problems related to social environment, problems with access to health care services and problems with primary support group AXIS V:  31-40 impairment in reality testing  Plan:  Recommend psychiatric Inpatient admission when medically cleared.  Subjective:   Kimberly Contreras is a 28 y.o. female patient admitted with depression and "psychological problems."  HPI: Patient is 28 year old AAF who presents to [facility] with depression and "psychological problems." Patient denies SI/HI, she reports auditory hallucinations calling her "stupid,"nobody is going to help me," and "you need to kill yourself." Patient reports hearing voices when she is crying, states she has crying spells every other day. Patient says she is experiencing relationship problemes and tearful during interview while discussing problems. Patient is unclear why she came to the hospital. Patent has 1 previous inpatient admission with [facility] in 2014 and states she stopped taking the medication (abilify) she was prescribed after 1 week because it wasn't helping her. Patient says she has been self-medicating with alcohol and THC. She drinks 1/5 of vodka every 2 days and uses marijuana weekly, states she smokes only 1 joint. Patient is not currently using any outpatient services. She says she has  anxiety with panic attacks, daily; last attack was on 03/31/13.    HPI Elements:   Location:  generalized. Severity:  severe. Timing:  abrupt. Duration:  chronic. Context:  stressors.  Past Psychiatric History: Past Medical History  Diagnosis Date  . Abscess   . Depression   . Obesity   . Anxiety     reports that she has been smoking Cigarettes.  She has been smoking about 1.00 pack per day. She has never used smokeless tobacco. She reports that she drinks about 1.2 ounces of alcohol per week. She reports that she uses illicit drugs (Marijuana). History reviewed. No pertinent family history. Family History Substance Abuse: No Family Supports: No (None reported ) Living Arrangements: Spouse/significant other Can pt return to current living arrangement?: Yes Abuse/Neglect [facility]) Physical Abuse: Denies Verbal Abuse: Denies Sexual Abuse: Denies Allergies:  No Known Allergies  ACT Assessment Complete:  Yes:    Educational Status    Risk to Self: Risk to self Suicidal Ideation: No Suicidal Intent: No Is patient at risk for suicide?: No Suicidal Plan?: No Access to Means: No What has been your use of drugs/alcohol within the last 12 months?: Abusing: alcohol, thc  Previous Attempts/Gestures: No How many times?: 0 Other Self Harm Risks: None  Triggers for Past Attempts: None known Intentional Self Injurious Behavior: None Family Suicide History: No Recent stressful life event(s): Other (Comment) (Relational issues, SA/Depression ) Persecutory voices/beliefs?: Yes Depression: Yes Depression Symptoms: Loss of interest in usual pleasures;Feeling worthless/self pity Substance abuse history and/or treatment for substance abuse?: Yes Suicide prevention information given to non-admitted patients: Not applicable  Risk to Others: Risk to Others Homicidal Ideation: No Thoughts of Harm to Others: No  Current Homicidal Intent: No Current Homicidal Plan: No Access to Homicidal Means:  No Identified Victim: None  History of harm to others?: No Assessment of Violence: None Noted Violent Behavior Description: None  Does patient have access to weapons?: No Criminal Charges Pending?: No Does patient have a court date: No  Abuse: Abuse/Neglect Assessment (Assessment to be complete while patient is alone) Physical Abuse: Denies Verbal Abuse: Denies Sexual Abuse: Denies Exploitation of patient/patient's resources: Denies Self-Neglect: Denies  Prior Inpatient Therapy: Prior Inpatient Therapy Prior Inpatient Therapy: Yes Prior Therapy Dates: 2014 Prior Therapy Facilty/Provider(s): [facility]  Reason for Treatment: Depression/SI   Prior Outpatient Therapy: Prior Outpatient Therapy Prior Outpatient Therapy: No Prior Therapy Dates: None  Prior Therapy Facilty/Provider(s): None  Reason for Treatment: None   Additional Information: Additional Information 1:1 In Past 12 Months?: No CIRT Risk: No Elopement Risk: No Does patient have medical clearance?: Yes                  Objective: Blood pressure 119/74, pulse 95, temperature 98 F (36.7 C), temperature source Oral, resp. rate 17, last menstrual period 03/03/2013, SpO2 100.00%.There is no weight on file to calculate BMI. Results for orders placed during the hospital encounter of 04/01/13 (from the past 72 hour(s))  URINE RAPID DRUG SCREEN (HOSP PERFORMED)     Status: Abnormal   Collection Time    04/01/13  9:14 PM      Result Value Range   Opiates NONE DETECTED  NONE DETECTED   Cocaine NONE DETECTED  NONE DETECTED   Benzodiazepines NONE DETECTED  NONE DETECTED   Amphetamines NONE DETECTED  NONE DETECTED   Tetrahydrocannabinol POSITIVE (*) NONE DETECTED   Barbiturates NONE DETECTED  NONE DETECTED   Comment:            DRUG SCREEN FOR MEDICAL PURPOSES     ONLY.  IF CONFIRMATION IS NEEDED     FOR ANY PURPOSE, NOTIFY LAB     WITHIN 5 DAYS.                LOWEST DETECTABLE LIMITS     FOR URINE DRUG SCREEN      Drug Class       Cutoff (ng/mL)     Amphetamine      1000     Barbiturate      200     Benzodiazepine   161     Tricyclics       096     Opiates          300     Cocaine          300     THC              50  POCT PREGNANCY, URINE     Status: None   Collection Time    04/01/13  9:18 PM      Result Value Range   Preg Test, Ur NEGATIVE  NEGATIVE   Comment:            THE SENSITIVITY OF THIS     METHODOLOGY IS >24 mIU/mL  ACETAMINOPHEN LEVEL     Status: None   Collection Time    04/01/13  9:45 PM      Result Value Range   Acetaminophen (Tylenol), Serum <15.0  10 - 30 ug/mL   Comment:            THERAPEUTIC CONCENTRATIONS VARY     SIGNIFICANTLY. A RANGE OF  10-30     ug/mL MAY BE AN EFFECTIVE     CONCENTRATION FOR MANY PATIENTS.     HOWEVER, SOME ARE BEST TREATED     AT CONCENTRATIONS OUTSIDE THIS     RANGE.     ACETAMINOPHEN CONCENTRATIONS     >150 ug/mL AT 4 HOURS AFTER     INGESTION AND >50 ug/mL AT 12     HOURS AFTER INGESTION ARE     OFTEN ASSOCIATED WITH TOXIC     REACTIONS.  CBC     Status: None   Collection Time    04/01/13  9:45 PM      Result Value Range   WBC 8.3  4.0 - 10.5 K/uL   RBC 4.94  3.87 - 5.11 MIL/uL   Hemoglobin 14.1  12.0 - 15.0 g/dL   HCT 42.6  36.0 - 46.0 %   MCV 86.2  78.0 - 100.0 fL   MCH 28.5  26.0 - 34.0 pg   MCHC 33.1  30.0 - 36.0 g/dL   RDW 14.5  11.5 - 15.5 %   Platelets 309  150 - 400 K/uL  COMPREHENSIVE METABOLIC PANEL     Status: Abnormal   Collection Time    04/01/13  9:45 PM      Result Value Range   Sodium 135 (*) 137 - 147 mEq/L   Potassium 4.1  3.7 - 5.3 mEq/L   Chloride 97  96 - 112 mEq/L   CO2 21  19 - 32 mEq/L   Glucose, Bld 71  70 - 99 mg/dL   BUN 7  6 - 23 mg/dL   Creatinine, Ser 0.57  0.50 - 1.10 mg/dL   Calcium 9.3  8.4 - 10.5 mg/dL   Total Protein 8.0  6.0 - 8.3 g/dL   Albumin 4.0  3.5 - 5.2 g/dL   AST 23  0 - 37 U/L   ALT 23  0 - 35 U/L   Alkaline Phosphatase 65  39 - 117 U/L   Total Bilirubin 0.5  0.3 - 1.2  mg/dL   GFR calc non Af Amer >90  >90 mL/min   GFR calc Af Amer >90  >90 mL/min   Comment: (NOTE)     The eGFR has been calculated using the CKD EPI equation.     This calculation has not been validated in all clinical situations.     eGFR's persistently <90 mL/min signify possible Chronic Kidney     Disease.  ETHANOL     Status: None   Collection Time    04/01/13  9:45 PM      Result Value Range   Alcohol, Ethyl (B) <11  0 - 11 mg/dL   Comment:            LOWEST DETECTABLE LIMIT FOR     SERUM ALCOHOL IS 11 mg/dL     FOR MEDICAL PURPOSES ONLY  SALICYLATE LEVEL     Status: Abnormal   Collection Time    04/01/13  9:45 PM      Result Value Range   Salicylate Lvl <8.4 (*) 2.8 - 20.0 mg/dL   Labs are reviewed and are pertinent for none alcohol <11 Current Facility-Administered Medications  Medication Dose Route Frequency Provider Last Rate Last Dose  . alum & mag hydroxide-simeth (MAALOX/MYLANTA) 200-200-20 MG/5ML suspension 30 mL  30 mL Oral PRN Ruthell Rummage Dammen, PA-C      . ibuprofen (ADVIL,MOTRIN) tablet 600 mg  600 mg Oral Q8H PRN  Martie Lee, PA-C      . LORazepam (ATIVAN) tablet 1 mg  1 mg Oral Q8H PRN Martie Lee, PA-C   1 mg at 04/02/13 1156  . nicotine (NICODERM CQ - dosed in mg/24 hours) patch 21 mg  21 mg Transdermal Daily Ruthell Rummage Dammen, PA-C   21 mg at 04/02/13 0945  . ondansetron (ZOFRAN) tablet 4 mg  4 mg Oral Q8H PRN Martie Lee, PA-C      . zolpidem (AMBIEN) tablet 5 mg  5 mg Oral QHS PRN Martie Lee, PA-C   5 mg at 04/01/13 2309   No current outpatient prescriptions on file.    Psychiatric Specialty Exam:     Blood pressure 119/74, pulse 95, temperature 98 F (36.7 C), temperature source Oral, resp. rate 17, last menstrual period 03/03/2013, SpO2 100.00%.There is no weight on file to calculate BMI.  General Appearance: Disheveled  Eye Sport and exercise psychologist::  Fair  Speech:  Clear and Coherent  Volume:  Normal  Mood:  Anxious, Depressed, Dysphoric, Irritable and  Worthless  Affect:  Constricted, Depressed and Restricted  Thought Process:  Circumstantial, Disorganized, Irrelevant and Tangential  Orientation:  Full (Time, Place, and Person)  Thought Content:  Hallucinations: Auditory, Ideas of Reference:   Paranoia and Rumination  Suicidal Thoughts:  Yes.  with intent/plan  Homicidal Thoughts:  No  Memory:  Immediate;   Fair Recent;   Fair Remote;   Fair  Judgement:  Fair  Insight:  Fair  Psychomotor Activity:  Decreased and Psychomotor Retardation  Concentration:  Fair  Recall:  Fair  Akathisia:  Yes  Handed:  Right  AIMS (if indicated):   0  Assets:  Desire for Improvement Leisure Time Resilience  Sleep:   fair    Treatment Plan Summary: Daily contact with patient to assess and evaluate symptoms and progress in treatment Medication management. Patient is endorsing auditory hallucinations, to kill self, and calling her stupid. Patient is depressed, and anxious. She has poor coping skills and in the context of stressors, uses THC and ETOH. She will be discharged to 407-1. Patient denies SI/HI.   Kallie Edward [facility] 04/02/2013 4:56 PM

## 2013-04-02 NOTE — Progress Notes (Signed)
[facility] Group Notes:  (Nursing/MHT/Case Management/Adjunct)  Date:  04/02/2013  Time:  8:00 p.m.   Type of Therapy:  Psychoeducational Skills  Participation Level:  Active  Participation Quality:  Appropriate  Affect:  Appropriate  Cognitive:  Appropriate  Insight:  Appropriate  Engagement in Group:  Engaged  Modes of Intervention:  Education  Summary of Progress/Problems: The patient verbalized in group this evening that she had a better day than yesterday. For one, she indicated that she took all of her medication. Secondly, she mentioned that she was able to rest more today. As a theme for today, she expressed that she intends to take her medication and try to find a new set of friends as relapse prevention.   Chaquetta Schlottman S 04/02/2013, 10:20 PM

## 2013-04-02 NOTE — ED Notes (Signed)
[facility], [place] here to transport patient.

## 2013-04-02 NOTE — BH Assessment (Signed)
Assessment Note  Kimberly Contreras is a 28 y.o. female who presents to [facility] with depression and "psychological problems".  Pt denies SI/HI, she reports auditory halluc calling her "stupid", "nobody's gonna help me" and "you need to kill yourself".  Pt reports hearing voices when she is crying, states she has crying spells every other day.  Pt says she is experiencing relationship problems and tearful during interview while discussing problems.  Pt is unclear why she came to the hospital.  Pt has 1 previous inpt admission with [facility] in 2014 and states she stopped taking the medication(abilify) she was prescribed after 1 week because it wasn't helping her.  Pt says she has been self medicating with alcohol and thc.  She drinks 1/5 of vodka every 2 days and uses marijuana wkly, states she smokes only 1 joint.  Pt is not currently using any outpatient services.  She says she has anxiety with panic attacks, daily, last attack was 03/31/13.  Pt has difficulty answering questions posed by this writer, unable to focus.  This writer had to repeat questions several times or ask open ended questions in very simple terms so she could understand.  Pt is pending AM psych eval for final disposition.     Axis I: Anxiety Disorder NOS, Depressive Disorder NOS and Alcohol Abuse; Cannabis Abuse  Axis II: Deferred Axis III:  Past Medical History  Diagnosis Date  . Abscess   . Depression   . Obesity   . Anxiety    Axis IV: other psychosocial or environmental problems, problems related to social environment and problems with primary support group Axis V: 31-40 impairment in reality testing  Past Medical History:  Past Medical History  Diagnosis Date  . Abscess   . Depression   . Obesity   . Anxiety     History reviewed. No pertinent past surgical history.  Family History: History reviewed. No pertinent family history.  Social History:  reports that she has been smoking Cigarettes.  She has been smoking about 1.00  pack per day. She has never used smokeless tobacco. She reports that she drinks about 1.2 ounces of alcohol per week. She reports that she uses illicit drugs (Marijuana).  Additional Social History:  Alcohol / Drug Use Pain Medications: None  Prescriptions: None  Over the Counter: None  History of alcohol / drug use?: Yes Longest period of sobriety (when/how long): None  Negative Consequences of Use: Work / School;Personal relationships;Financial Withdrawal Symptoms: Other (Comment) (No current w/d sxs ) Substance #1 Name of Substance 1: Alcohol 1 - Age of First Use: Teens  1 - Amount (size/oz): 1/5  1 - Frequency: 2x's wkly  1 - Duration: on-going  1 - Last Use / Amount: 2 days ago  Substance #2 Name of Substance 2: THC  2 - Age of First Use: Teens  2 - Amount (size/oz): 1 Joint  2 - Frequency: Monthly  2 - Duration: On-going  2 - Last Use / Amount: the last 30 days   CIWA: CIWA-Ar BP: 142/63 mmHg Pulse Rate: 98 COWS:    Allergies: No Known Allergies  Home Medications:  (Not in a hospital admission)  OB/GYN Status:  Patient's last menstrual period was 03/03/2013.  General Assessment Data Location of Assessment: [facility] Is this a Tele or Face-to-Face Assessment?: Tele Assessment Is this an Initial Assessment or a Re-assessment for this encounter?: Initial Assessment Living Arrangements: Spouse/significant other Can pt return to current living arrangement?: Yes Admission Status: Voluntary Is patient  capable of signing voluntary admission?: Yes Transfer from: Acute Hospital Referral Source: MD  Medical Screening Exam [facility] Walk-in ONLY) Medical Exam completed: No Reason for MSE not completed: Other: (None )  [facility] Living Arrangements: Spouse/significant other Name of Psychiatrist: None  Name of Therapist: None   Education Status Is patient currently in school?: No Current Grade: None  Highest grade of school patient has completed: None  Name of  school: None  Contact person: None   Risk to self Suicidal Ideation: No Suicidal Intent: No Is patient at risk for suicide?: No Suicidal Plan?: No Access to Means: No What has been your use of drugs/alcohol within the last 12 months?: Abusing: alcohol, thc  Previous Attempts/Gestures: No How many times?: 0 Other Self Harm Risks: None  Triggers for Past Attempts: None known Intentional Self Injurious Behavior: None Family Suicide History: No Recent stressful life event(s): Other (Comment) (Relational issues, SA/Depression ) Persecutory voices/beliefs?: Yes Depression: Yes Depression Symptoms: Loss of interest in usual pleasures;Feeling worthless/self pity Substance abuse history and/or treatment for substance abuse?: Yes Suicide prevention information given to non-admitted patients: Not applicable  Risk to Others Homicidal Ideation: No Thoughts of Harm to Others: No Current Homicidal Intent: No Current Homicidal Plan: No Access to Homicidal Means: No Identified Victim: None  History of harm to others?: No Assessment of Violence: None Noted Violent Behavior Description: None  Does patient have access to weapons?: No Criminal Charges Pending?: No Does patient have a court date: No  Psychosis Hallucinations: Auditory;With command Delusions: None noted  Mental Status Report Appear/Hygiene: Disheveled;Poor hygiene Eye Contact: Fair Motor Activity: Unremarkable Speech: Logical/coherent;Soft Level of Consciousness: Alert Mood: Depressed;Sad Affect: Depressed;Sad Anxiety Level: Minimal Thought Processes: Coherent;Relevant Judgement: Unimpaired Orientation: Person;Place;Time;Situation Obsessive Compulsive Thoughts/Behaviors: None  Cognitive Functioning Concentration: Decreased Memory: Recent Intact;Remote Intact IQ: Average Insight: Fair Impulse Control: Fair Appetite: Good Weight Loss: 6 Weight Gain: 0 Sleep: No Change Total Hours of Sleep: 6 Vegetative  Symptoms: None  ADLScreening [facility] Assessment Services) Patient's cognitive ability adequate to safely complete daily activities?: Yes Patient able to express need for assistance with ADLs?: Yes Independently performs ADLs?: Yes (appropriate for developmental age)  Prior Inpatient Therapy Prior Inpatient Therapy: Yes Prior Therapy Dates: 2014 [facility]  Reason for Treatment: Depression/SI   Prior Outpatient Therapy Prior Outpatient Therapy: No Prior Therapy Dates: None  Prior Therapy Facilty/Provider(s): None  Reason for Treatment: None   ADL Screening (condition at time of admission) Patient's cognitive ability adequate to safely complete daily activities?: Yes Is the patient deaf or have difficulty hearing?: No Does the patient have difficulty seeing, even when wearing glasses/contacts?: No Does the patient have difficulty concentrating, remembering, or making decisions?: No Patient able to express need for assistance with ADLs?: Yes Does the patient have difficulty dressing or bathing?: No Independently performs ADLs?: Yes (appropriate for developmental age) Does the patient have difficulty walking or climbing stairs?: No Weakness of Legs: None Weakness of Arms/Hands: None  Home Assistive Devices/Equipment Home Assistive Devices/Equipment: None  Therapy Consults (therapy consults require a physician order) PT Evaluation Needed: No OT Evalulation Needed: No SLP Evaluation Needed: No Abuse/Neglect Assessment (Assessment to be complete while patient is alone) Physical Abuse: Denies Verbal Abuse: Denies Sexual Abuse: Denies Exploitation of patient/patient's resources: Denies Self-Neglect: Denies Values / Beliefs Cultural Requests During Hospitalization: None Spiritual Requests During Hospitalization: None Consults Spiritual Care Consult Needed: No Social Work Consult Needed: No Merchant navy officerAdvance Directives (For Healthcare) Advance Directive: Patient  does not  have advance directive;Patient would not like information Pre-existing out of facility DNR order (yellow form or pink MOST form): No Nutrition Screen- [facility] Patient's home diet: Regular  Additional Information 1:1 In Past 12 Months?: No CIRT Risk: No Elopement Risk: No Does patient have medical clearance?: Yes     Disposition:  Disposition Initial Assessment Completed for this Encounter: Yes Disposition of Patient: Referred to (Pending AM psych eval for final disposition ) Patient referred to: Other (Comment) (Pending AM psych eval for final dispostion )  On Site Evaluation by:   Reviewed with Physician:    Murrell Redden 04/02/2013 4:47 AM

## 2013-04-02 NOTE — Consult Note (Signed)
Patient evaluated by me, needs inpatient treatment and she was in her depression, seems overwhelmed.

## 2013-04-02 NOTE — Progress Notes (Signed)
CSW attempted to call patient sister to obtain collateral information as requested by psychiatrist and NP. CSW unable to reach pt sister and unable to leave a message. Per further discussion with NP, patient recommended for inpatient psychiatric treatment.   Olga CoasterKristen Riad Wagley, [place]CS[place] [phone] ED CSW .04/02/2013 1425pm

## 2013-04-03 DIAGNOSIS — F411 Generalized anxiety disorder: Secondary | ICD-10-CM

## 2013-04-03 DIAGNOSIS — F332 Major depressive disorder, recurrent severe without psychotic features: Secondary | ICD-10-CM

## 2013-04-03 DIAGNOSIS — R45851 Suicidal ideations: Secondary | ICD-10-CM

## 2013-04-03 LAB — URINALYSIS, ROUTINE W REFLEX MICROSCOPIC
Bilirubin Urine: NEGATIVE
GLUCOSE, UA: NEGATIVE mg/dL
HGB URINE DIPSTICK: NEGATIVE
Ketones, ur: NEGATIVE mg/dL
Leukocytes, UA: NEGATIVE
Nitrite: NEGATIVE
Protein, ur: NEGATIVE mg/dL
SPECIFIC GRAVITY, URINE: 1.035 — AB (ref 1.005–1.030)
UROBILINOGEN UA: 2 mg/dL — AB (ref 0.0–1.0)
pH: 6 (ref 5.0–8.0)

## 2013-04-03 LAB — URINE MICROSCOPIC-ADD ON

## 2013-04-03 MED ORDER — OLANZAPINE 10 MG PO TBDP
10.0000 mg | ORAL_TABLET | Freq: Three times a day (TID) | ORAL | Status: DC | PRN
  Administered 2013-04-03 – 2013-04-04 (×2): 10 mg via ORAL
  Filled 2013-04-03 (×3): qty 1

## 2013-04-03 MED ORDER — TRAZODONE HCL 50 MG PO TABS
50.0000 mg | ORAL_TABLET | Freq: Every evening | ORAL | Status: DC | PRN
  Administered 2013-04-03 – 2013-04-06 (×4): 50 mg via ORAL
  Filled 2013-04-03: qty 14
  Filled 2013-04-03 (×4): qty 1

## 2013-04-03 MED ORDER — CITALOPRAM HYDROBROMIDE 10 MG PO TABS
10.0000 mg | ORAL_TABLET | Freq: Every day | ORAL | Status: DC
  Administered 2013-04-03 – 2013-04-05 (×3): 10 mg via ORAL
  Filled 2013-04-03 (×6): qty 1

## 2013-04-03 NOTE — Progress Notes (Signed)
Patient ID: Winnifred FriarJewell Ausmus, female   DOB: Aug 11, 1985, 28 y.o.   MRN: [id number] Psychoeducational Group Note  Date:  04/03/2013 Time:1000am  Group Topic/Focus:  Identifying Needs:   The focus of this group is to help patients identify their personal needs that have been historically problematic and identify healthy behaviors to address their needs.  Participation Level:  Active  Participation Quality:  Appropriate  Affect:  Appropriate  Cognitive:  Appropriate  Insight:  Supportive  Engagement in Group:  Supportive  Additional Comments:  Inventory and Psychoeducational group-healthy coping skills  Valente DavidWeaver, Javian Nudd Brooks 04/03/2013,12:28 PM

## 2013-04-03 NOTE — [facility] Suicide Risk Assessment (Signed)
Suicide Risk Assessment  Admission Assessment     Nursing information obtained from:  Patient Demographic factors:  Adolescent or young adult;Low socioeconomic status;Living alone;Unemployed Current Mental Status:  Self-harm thoughts Loss Factors:  Loss of significant relationship Historical Factors:  Prior suicide attempts;Impulsivity Risk Reduction Factors:  Religious beliefs about death  CLINICAL FACTORS:   Panic Attacks Depression:   Anhedonia Hopelessness Impulsivity Dysthymia More than one psychiatric diagnosis Unstable or Poor Therapeutic Relationship Previous Psychiatric Diagnoses and Treatments  COGNITIVE FEATURES THAT CONTRIBUTE TO RISK:  Closed-mindedness Polarized thinking    SUICIDE RISK:   Severe:  Frequent, intense, and enduring suicidal ideation, specific plan, no subjective intent, but some objective markers of intent (i.e., choice of lethal method), the method is accessible, some limited preparatory behavior, evidence of impaired self-control, severe dysphoria/symptomatology, multiple risk factors present, and few if any protective factors, particularly a lack of social support.  PLAN OF CARE:  I certify that inpatient services furnished can reasonably be expected to improve the patient's condition.  Salley Boxley 04/03/2013, 10:34 AM
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
## 2013-04-03 NOTE — [facility] Group Notes (Signed)
[facility] Group Notes:  (Clinical Social Work)  04/03/2013  11:00-11:45AM  Summary of Progress/Problems:   The main focus of today's process group was for the patient to identify ways in which they have in the past sabotaged their own recovery and reasons they may have done this/what they received from doing it.  We then worked to identify a specific plan to avoid doing this when discharged from the hospital for this admission.  The patient expressed good insight into the need to stay on her medications, and questioned what to do if a doctor will not listen to you.  We discussed this at some length and she demonstrated positive plans to come up with questions for the doctor prior to each visit in order to maximize her use of the doctor as a resource.  Type of Therapy:  Group Therapy - Process  Participation Level:  Active  Participation Quality:  Attentive  Affect:  Blunted  Cognitive:  Appropriate  Insight:  Engaged  Engagement in Therapy:  Engaged  Modes of Intervention:  Clarification, Education, Exploration, Discussion  Ambrose MantleMareida Grossman-Orr, LCSW 04/03/2013, 1:20 PM

## 2013-04-03 NOTE — [facility] Counselor (Signed)
Adult Psychosocial Assessment Update Interdisciplinary Team  [facility] admissions/discharges:  Admissions Discharges  Date:  11/24/12 Date:  11/28/12  Date: Date:  Date: Date:  Date: Date:  Date: Date:   Changes since the last Psychosocial Assessment (including adherence to outpatient mental health and/or substance abuse treatment, situational issues contributing to decompensation and/or relapse). She stopped taking her medication one week after discharge because "it wasn't helping."  She has no outpatient providers.  She states she only has psychosis when she drinks or uses drugs.             Discharge Plan 1. Will you be returning to the same living situation after discharge?   Yes: No:      If no, what is your plan?    She states this is complicated.       2. Would you like a referral for services when you are discharged? Yes:  XX   If yes, for what services?  No:       Needs mental health provider, did not follow up last time.       Summary and Recommendations (to be completed by the evaluator) This is a 28yo African-American female who asked for hospitalization due to increased depression, auditory hallucinations, crying spells.   She says she has anxiety with panic attacks daily.  She is experiencing relationship problems and says they are difficult to handle because her living situation is unstable. She states she stopped taking the medication from her previous hospitalization here (Abilify)  after 1 week because "it wasn't helping" her.   She has been self-medicating with alcohol and marijuana.  She drinks 1/5 of vodka every 2 days and uses marijuana wkly, states she smokes 1 joint.  She is not currently using any outpatient services.  She has considerable thought blocking and disorganization in her speech throughout this interview.  She would benefit from safety monitoring, medication evaluation, psychoeducation, group therapy, and discharge planning  to link with ongoing resources.                        Signature:  Sarina SerGrossman-Orr, Piper Hassebrock Jo, 04/03/2013 5:47 PM

## 2013-04-03 NOTE — Progress Notes (Signed)
Patient ID: Kimberly Contreras, female   DOB: February 28, 1986, 28 y.o.   MRN: [id number] D. Patient presents with depressed mood, affect blunted. She is not very forthcoming with concerns, but upon interaction she reports '' I'm a little anxious yeah. I haven't heard the voices this morning. '' Patient continues to report feeling hopeless, depressed, anxious and a lack of motivation. A. Medications given as ordered. Support and encouragement provided. R. Patient remains cooperative on the unit. She has attended unit programming but minimal interaction with peers. No further voiced concerns at this time. Will continue to monitor q 15 minutes for safety.

## 2013-04-03 NOTE — Progress Notes (Signed)
D: Pt denies SI/HI/AVH. Pt is pleasant and cooperative. Pt said she came here to take care of her "psycological issues". Pt said she wanted better medications, because she is having a hard time trying to focus. Pt said she was trying to get her GED, but not being able to focus/concentrate was making it difficult.Pt plans to work on getting her GED [facility].  A: Pt was offered support and encouragement. Pt was given scheduled medications. Pt was encourage to attend groups. Q 15 minute checks were done for safety.  R:Pt attends groups and interacts well with peers and staff. Pt is taking medication. Pt has no complaints.Pt receptive to treatment and safety maintained on unit.

## 2013-04-03 NOTE — Progress Notes (Signed)
[facility] Group Notes:  (Nursing/MHT/Case Management/Adjunct)  Date:  04/03/2013  Time:  8:00 p.m.   Type of Therapy:  Psychoeducational Skills  Participation Level:  Active  Participation Quality:  Appropriate  Affect:  Appropriate  Cognitive:  Appropriate  Insight:  Improving  Engagement in Group:  Improving  Modes of Intervention:  Education  Summary of Progress/Problems: The patient had little to share in group this evening. She indicated that she had a better day as she is feeling better. In terms of the theme of the day, her support system consists of her ex-boyfriend and [address].   Hazle CocaGOODMAN, Chanah Tidmore S 04/03/2013, 9:16 PM

## 2013-04-04 LAB — HIV ANTIBODY (ROUTINE TESTING W REFLEX): HIV: NONREACTIVE

## 2013-04-04 LAB — RPR: RPR Ser Ql: NONREACTIVE

## 2013-04-04 MED ORDER — PHENAZOPYRIDINE HCL 100 MG PO TABS: 100.0000 mg | ORAL_TABLET | Freq: Three times a day (TID) | ORAL | Status: DC | PRN

## 2013-04-04 MED ORDER — NITROFURANTOIN MONOHYD MACRO 100 MG PO CAPS
100.0000 mg | ORAL_CAPSULE | Freq: Two times a day (BID) | ORAL | Status: DC
  Administered 2013-04-04 – 2013-04-07 (×7): 100 mg via ORAL
  Filled 2013-04-04 (×2): qty 14
  Filled 2013-04-04: qty 1
  Filled 2013-04-04 (×2): qty 14
  Filled 2013-04-04 (×8): qty 1

## 2013-04-04 MED ORDER — NITROFURANTOIN MACROCRYSTAL 100 MG PO CAPS
100.0000 mg | ORAL_CAPSULE | Freq: Four times a day (QID) | ORAL | Status: DC
  Filled 2013-04-04 (×4): qty 1

## 2013-04-04 MED ORDER — NITROFURANTOIN MACROCRYSTAL 100 MG PO CAPS: 100.0000 mg | ORAL_CAPSULE | Freq: Four times a day (QID) | ORAL | Status: DC

## 2013-04-04 NOTE — Progress Notes (Signed)
Patient ID: Kimberly Contreras, female   DOB: [birth year], 28 y.o.   MRN: [id number] Psychoeducational Group Note  Date:  04/04/2013 Time:  1000am  Group Topic/Focus:  Making Healthy Choices:   The focus of this group is to help patients identify negative/unhealthy choices they were using prior to admission and identify positive/healthier coping strategies to replace them upon discharge.  Participation Level:  Active  Participation Quality:  Appropriate  Affect:  Appropriate  Cognitive:  Appropriate  Insight:  Supportive  Engagement in Group:  Supportive  Additional Comments:  Inventory and Psychoeducational group - healthy support systems.   Valente DavidWeaver, Kimberly Contreras 04/04/2013,10:40 AM

## 2013-04-04 NOTE — Progress Notes (Signed)
Patient ID: Kimberly Contreras, female   DOB: 1986/02/17, 28 y.o.   MRN: [id number] D. Patient presents with depressed mood, affect blunted. She is not very forthcoming with concerns, minimal spontaneous conversation, but she reports improvements. She continues to deny any AH/VH. Pt completed self inventory and rates her sleep as good, and depression at 1/10 on depression scale 1 being least depressed, 10 being worst depressed, however her affect and mood are not congruent to what she rates. She denies any SI at this time. A. Medications given as ordered. Support and encouragement provided. R. Patient remains cooperative on the unit. She has attended unit programming but minimal interaction with peers. No further voiced concerns at this time. Will continue to monitor q 15 minutes for safety.

## 2013-04-04 NOTE — Progress Notes (Signed)
[facility] MD Progress Note  04/04/2013 10:26 AM Kimberly Contreras  MRN:  [id number] Subjective:   Patient states "I feel like my mood is a little better today. I feel more hopeful since starting my new medication. I still feel depressed but I have been pushing myself to get up and do things. I am still having some burning when I urinate."   Objective:  Patient continues to report ongoing symptoms of depression and anxiety. Patient reports feeling hopeless, anxious with little motivation to accomplish daily chores.  She appears depressed and is reluctant to forward much information. Patient denies any psychosis stating "Only have that when I am using drugs or drinking." The patient is visible on the unit and attending the scheduled groups. Patient is compliant with her medication regimen and denies any adverse effects.   Diagnosis:   DSM5: Schizophrenia Disorders:  Obsessive-Compulsive Disorders:  Trauma-Stressor Disorders:  Substance/Addictive Disorders: Cannabis Use Disorder - Moderate 9304.30)  Depressive Disorders: Major Depressive Disorder - Severe (296.23)  AXIS I: Generalized Anxiety Disorder, Major Depression, Recurrent severe with psychosis and Cannabis abuse  AXIS II: Deferred  AXIS III:  Past Medical History   Diagnosis  Date   .  Abscess    .  Depression    .  Obesity    .  Anxiety     AXIS IV: economic problems, occupational problems, other psychosocial or environmental problems, problems related to social environment and problems with primary support group  AXIS V: 41-50 serious symptoms  ADL's:  Intact  Sleep: Fair  Appetite:  Good  Suicidal Ideation:  Passive SI with no plan  Homicidal Ideation:  Denies  AEB (as evidenced by):  Psychiatric Specialty Exam: Review of Systems  Constitutional: Negative.   HENT: Negative.   Eyes: Negative.   Respiratory: Negative.   Cardiovascular: Negative.   Gastrointestinal: Negative.   Genitourinary: Positive for dysuria and  frequency.  Musculoskeletal: Negative.   Skin: Negative.   Neurological: Negative.   Endo/Heme/Allergies: Negative.   Psychiatric/Behavioral: Positive for depression and substance abuse. Negative for suicidal ideas and hallucinations. The patient is nervous/anxious.     Blood pressure 138/95, pulse 91, temperature 98.8 F (37.1 C), temperature source Oral, resp. rate 16, height 5\' 8"  (1.727 m), weight 135.172 kg (298 lb), last menstrual period 03/03/2013, SpO2 98.00%.Body mass index is 45.32 kg/(m^2).  General Appearance: Casual  Eye Contact::  Good  Speech:  Clear and Coherent  Volume:  Decreased  Mood:  Depressed  Affect:  Flat  Thought Process:  Goal Directed  Orientation:  Full (Time, Place, and Person)  Thought Content:  WDL  Suicidal Thoughts:  Yes.  without intent/plan  Homicidal Thoughts:  No  Memory:  Immediate;   Good Recent;   Good Remote;   Good  Judgement:  Impaired  Insight:  Shallow  Psychomotor Activity:  Decreased  Concentration:  Fair  Recall:  Good  Akathisia:  No  Handed:  Right  AIMS (if indicated):     Assets:  Communication Skills Desire for Improvement Leisure Time Physical Health Resilience  Sleep:  Number of Hours: 6.75   Current Medications: Current Facility-Administered Medications  Medication Dose Route Frequency Provider Last Rate Last Dose  . acetaminophen (TYLENOL) tablet 650 mg  650 mg Oral Q6H PRN Kendrick Fries, NP   650 mg at 04/02/13 2003  . alum & mag hydroxide-simeth (MAALOX/MYLANTA) 200-200-20 MG/5ML suspension 30 mL  30 mL Oral PRN Kendrick Fries, NP      . alum & mag  hydroxide-simeth (MAALOX/MYLANTA) 200-200-20 MG/5ML suspension 30 mL  30 mL Oral Q4H PRN Meghan Blankmann, NP      . citalopram (CELEXA) tablet 10 mg  10 mg Oral Daily Fransisca KaufmannLaura Davis, NP   10 mg at 04/04/13 0753  . ibuprofen (ADVIL,MOTRIN) tablet 600 mg  600 mg Oral Q8H PRN Meghan Blankmann, NP      . influenza vac split quadrivalent PF (FLUARIX) injection 0.5 mL   0.5 mL Intramuscular Tomorrow-1000 Mojeed Akintayo      . magnesium hydroxide (MILK OF MAGNESIA) suspension 30 mL  30 mL Oral Daily PRN Meghan Blankmann, NP      . nicotine (NICODERM CQ - dosed in mg/24 hours) patch 21 mg  21 mg Transdermal Daily Meghan Blankmann, NP   21 mg at 04/04/13 0754  . OLANZapine zydis (ZYPREXA) disintegrating tablet 10 mg  10 mg Oral Q8H PRN Fransisca KaufmannLaura Davis, NP   10 mg at 04/03/13 2050  . ondansetron (ZOFRAN) tablet 4 mg  4 mg Oral Q8H PRN Meghan Blankmann, NP      . pneumococcal 23 valent vaccine (PNU-IMMUNE) injection 0.5 mL  0.5 mL Intramuscular Tomorrow-1000 Mojeed Akintayo      . traZODone (DESYREL) tablet 50 mg  50 mg Oral QHS PRN Fransisca KaufmannLaura Davis, NP   50 mg at 04/03/13 2050    Lab Results:  Results for orders placed during the hospital encounter of 04/02/13 (from the past 48 hour(s))  HIV ANTIBODY (ROUTINE TESTING)     Status: None   Collection Time    04/03/13  7:47 PM      Result Value Range   HIV NON REACTIVE  NON REACTIVE   Comment: Performed at [facility]  RPR     Status: None   Collection Time    04/03/13  7:47 PM      Result Value Range   RPR NON REACTIVE  NON REACTIVE   Comment: Performed at [facility]  URINALYSIS, ROUTINE W REFLEX MICROSCOPIC     Status: Abnormal   Collection Time    04/03/13  8:14 PM      Result Value Range   Color, Urine YELLOW  YELLOW   APPearance TURBID (*) CLEAR   Specific Gravity, Urine 1.035 (*) 1.005 - 1.030   pH 6.0  5.0 - 8.0   Glucose, UA NEGATIVE  NEGATIVE mg/dL   Hgb urine dipstick NEGATIVE  NEGATIVE   Bilirubin Urine NEGATIVE  NEGATIVE   Ketones, ur NEGATIVE  NEGATIVE mg/dL   Protein, ur NEGATIVE  NEGATIVE mg/dL   Urobilinogen, UA 2.0 (*) 0.0 - 1.0 mg/dL   Nitrite NEGATIVE  NEGATIVE   Leukocytes, UA NEGATIVE  NEGATIVE   Comment: Performed at [facility]  URINE MICROSCOPIC-ADD ON     Status: Abnormal   Collection Time    04/03/13  8:14 PM      Result Value Range    Bacteria, UA MANY (*) RARE   Comment: Performed at [facility]    Physical Findings: AIMS: Facial and Oral Movements Muscles of Facial Expression: None, normal Lips and Perioral Area: None, normal Jaw: None, normal Tongue: None, normal,Extremity Movements Upper (arms, wrists, hands, fingers): None, normal Lower (legs, knees, ankles, toes): None, normal, Trunk Movements Neck, shoulders, hips: None, normal, Overall Severity Severity of abnormal movements (highest score from questions above): None, normal Incapacitation due to abnormal movements: None, normal Patient's awareness of abnormal movements (rate only patient's report): No Awareness, Dental Status Current problems with  teeth and/or dentures?: No Does patient usually wear dentures?: No  CIWA:  CIWA-Ar Total: 0 COWS:  COWS Total Score: 0  Treatment Plan Summary: Daily contact with patient to assess and evaluate symptoms and progress in treatment Medication management  Plan:Continue crisis management and stabilization.  Medication management:  Reviewed with patient who states no untoward effects. Continue Celexa 10 mg daily for depression and anxiety.  Encouraged patient to attend groups and participate in group counseling sessions and activities.  Discharge plan in progress.  Continue current treatment plan.  Address health issues: Vitals reviewed and stable. Reviewed UA results which show many bacteria plus patent reports symptoms of UTI. Start Macrodantin 100 mg every six hours times seven days. Order pyridium prn for dysuria.   Medical Decision Making Problem Points:  Established problem, stable/improving (1) and Review of psycho-social stressors (1) Data Points:  Review or order clinical lab tests (1) Review of medication regiment & side effects (2) Review of new medications or change in dosage (2)  I certify that inpatient services furnished can reasonably be expected to improve the patient's  condition.   DAVIS, LAURA NP-C 04/04/2013, 10:26 AM I agree with findings and treatment plan of this patient

## 2013-04-04 NOTE — Progress Notes (Signed)
Spoke with pt 1:1 who is anxious, depressed. Asking for prn for anxiety and sleep. Guarded, forwards little info. Denying any AH this evening. No SI/HI/VH expressed. Pt given support, encouragement and med education.  Medicated per her requests - trazadone and zyprexa - both of which provided relief. Pt cooperative. Currently safe resting in bed. Lawrence MarseillesFriedman, Brody Bonneau Eakes

## 2013-04-04 NOTE — Progress Notes (Signed)
[facility] Group Notes:  (Nursing/MHT/Case Management/Adjunct)  Date:  04/04/2013  Time:  8:00 p.m.  Type of Therapy:  Psychoeducational Skills  Participation Level:  Minimal  Participation Quality:  Resistant  Affect:  Depressed  Cognitive:  Lacking  Insight:  Lacking  Engagement in Group:  Resistant  Modes of Intervention:  Education  Summary of Progress/Problems: The patient expressed in group this evening that she had an "okay" day since she felt better. The patient would not divulge any details regarding her day. As theme for the day, she indicated that her support system will be comprised of her boyfriend and a friend.   Hazle CocaGOODMAN, Zymiere Trostle S 04/04/2013, 9:58 PM

## 2013-04-04 NOTE — [facility] Group Notes (Signed)
[facility] Group Notes:  (Clinical Social Work)  04/04/2013   11:15am-12:00pm  Summary of Progress/Problems:  The main focus of today's process group was to listen to a variety of genres of music and to identify that different types of music provoke different responses.  The patient then was able to identify personally what was soothing for them, as well as energizing.  Handouts were used to record feelings evoked, as well as how patient can personally use this knowledge in sleep habits, with depression, and with other symptoms.  The patient expressed understanding of concepts, as well as knowledge of how each type of music affected her and how this can be used at home as a wellness/recovery tool.  She stated she listens to the radio quite a bit and it helps her to be calm and relaxed.  She expressed good self-knowledge.  Type of Therapy:  Music Therapy   Participation Level:  Active  Participation Quality:  Attentive and Sharing  Affect:  Blunted and Depressed  Cognitive:  Oriented  Insight:  Engaged  Engagement in Therapy:  Engaged  Modes of Intervention:   Activity, Exploration  Ambrose MantleMareida Grossman-Orr, LCSW 04/04/2013, 12:30pm

## 2013-04-04 NOTE — Progress Notes (Signed)
Pt with flat affect, depressed mood. Isolative at times. Cautious in her interactions but pleasant and cooperative. Complaining of some mild burning upon urination. Otherwise, no physical complaints. Denying any hallucinations at present. Medicated per orders. Given zyprexa prn for anxiety and trazadone for sleep per her request. Support, encouragement offered. On reassess pt is asleep. Denies SI/HI and remains safe. Lawrence MarseillesFriedman, Cesiah Westley Eakes

## 2013-04-05 DIAGNOSIS — F29 Unspecified psychosis not due to a substance or known physiological condition: Secondary | ICD-10-CM

## 2013-04-05 LAB — GC/CHLAMYDIA PROBE AMP
CT Probe RNA: NEGATIVE
GC Probe RNA: NEGATIVE

## 2013-04-05 MED ORDER — CITALOPRAM HYDROBROMIDE 20 MG PO TABS
20.0000 mg | ORAL_TABLET | Freq: Every day | ORAL | Status: DC
  Administered 2013-04-06 – 2013-04-07 (×2): 20 mg via ORAL
  Filled 2013-04-05 (×2): qty 1
  Filled 2013-04-05: qty 14
  Filled 2013-04-05: qty 1
  Filled 2013-04-05: qty 14

## 2013-04-05 NOTE — [facility] Group Notes (Signed)
[facility] LCSW Group Therapy  04/05/2013 1:15 pm  Type of Therapy: Process Group Therapy  Participation Level:  Minimal  Participation Quality:  Appropriate  Affect:  Flat  Cognitive:  Oriented  Insight: Poor  Engagement in Group:  Limited  Engagement in Therapy:  Limited  Modes of Intervention:  Activity, Clarification, Education, Problem-solving and Support  Summary of Progress/Problems: Today's group addressed the issue of overcoming obstacles.  Patients were asked to identify their biggest obstacle post d/c that stands in the way of their on-going success, and then problem solve as to how to manage this.  Kimberly Contreras had nothing to share.  She stated she has no obstacles, and would/could not elaborate.  Poor insight, poor judgment.  Kimberly Contreras, Kimberly Contreras B 04/05/2013   4:26 PM

## 2013-04-05 NOTE — Tx Team (Signed)
  Interdisciplinary Treatment Plan Update   Date Reviewed:  04/05/2013  Time Reviewed:  8:13 AM  Progress in Treatment:   Attending groups: Yes Participating in groups: Yes Taking medication as prescribed: Yes  Tolerating medication: Yes Family/Significant other contact made: No Patient understands diagnosis: Yes  AEB asking for help with getting back on meds to help her symptoms Discussing patient identified problems/goals with staff: Yes  See initial care plan Medical problems stabilized or resolved: Yes Denies suicidal/homicidal ideation: Yes  In tx team Patient has not harmed self or others: Yes  For review of initial/current patient goals, please see plan of care.  Estimated Length of Stay:  3-5 days  Reason for Continuation of Hospitalization: Depression Hallucinations Medication stabilization  New Problems/Goals identified:  N/A  Discharge Plan or Barriers:   return home, follow up outpt  Additional Comments:  This is a 28yo African-American female who asked for hospitalization due to increased depression, auditory hallucinations, crying spells. She says she has anxiety with panic attacks daily. She is experiencing relationship problems and says they are difficult to handle because her living situation is unstable. She states she stopped taking the medication from her previous hospitalization here (Abilify) after 1 week because "it wasn't helping" her. She has been self-medicating with alcohol and marijuana. She drinks 1/5 of vodka every 2 days and uses marijuana wkly, states she smokes 1 joint.   Attendees:  Signature: Thedore MinsMojeed Akintayo, MD 04/05/2013 8:13 AM   Signature: Richelle Itood Mccabe Gloria, LCSW 04/05/2013 8:13 AM  Signature: Fransisca KaufmannLaura Davis, NP 04/05/2013 8:13 AM  Signature: Joslyn Devonaroline Beaudry, RN 04/05/2013 8:13 AM  Signature: Liborio NixonPatrice White, RN 04/05/2013 8:13 AM  Signature:  04/05/2013 8:13 AM  Signature:   04/05/2013 8:13 AM  Signature:    Signature:    Signature:    Signature:     Signature:    Signature:      Scribe for Treatment Team:   Richelle Itood Meegan Shanafelt, LCSW  04/05/2013 8:13 AM

## 2013-04-05 NOTE — Progress Notes (Signed)
[facility]  04/05/2013 10:31 AM Kimberly FriarJewell Contreras  MRN:  [id number] Subjective:  Patient stated she has been feeling better since she got here.  Prior to admission, she said she was hallucinating and agitated but her medications are working for her.  Sleep and appetite are "good", denies depression despite being suicidal upon admission.  Depressed affect with minimization of her issues. Diagnosis:   DSM5:  Depressive Disorders:  Major Depressive Disorder - with Psychotic Features (296.24)  Axis I: Anxiety Disorder NOS and Major Depression, Recurrent severe Axis II: Deferred Axis III:  Past Medical History  Diagnosis Date  . Abscess   . Depression   . Obesity   . Anxiety    Axis IV: educational problems, other psychosocial or environmental problems, problems related to social environment and problems with primary support group Axis V: 41-50 serious symptoms  ADL's:  Intact  Sleep: Good  Appetite:  Good  Suicidal Ideation:  Plan:  vague Intent:  none Means:  none Homicidal Ideation:  Denies   Psychiatric Specialty Exam: Review of Systems  Constitutional: Negative.   HENT: Negative.   Eyes: Negative.   Respiratory: Negative.   Cardiovascular: Negative.   Gastrointestinal: Negative.   Genitourinary: Negative.   Musculoskeletal: Negative.   Skin: Negative.   Neurological: Negative.   Endo/Heme/Allergies: Negative.   Psychiatric/Behavioral: Positive for depression. The patient is nervous/anxious.     Blood pressure 141/81, pulse 60, temperature 97.6 F (36.4 C), temperature source Oral, resp. rate 20, height 5\' 8"  (1.727 m), weight 135.172 kg (298 lb), last menstrual period 03/03/2013, SpO2 98.00%.Body mass index is 45.32 kg/(m^2).  General Appearance: Casual  Eye Contact::  Fair  Speech:  Slow  Volume:  Decreased  Mood:  Depressed  Affect:  Congruent  Thought Process:  Coherent  Orientation:  Full (Time, Place, and Person)  Thought Content:  Rumination   Suicidal Thoughts:  Yes.  without intent/plan  Homicidal Thoughts:  No  Memory:  Immediate;   Fair Recent;   Fair Remote;   Fair  Judgement:  Fair  Insight:  Lacking  Psychomotor Activity:  Decreased  Concentration:  Fair  Recall:  Fair  Akathisia:  No  Handed:  Right  AIMS (if indicated):     Assets:  Physical Health Resilience Social Support  Sleep:  Number of Hours: 6.5   Current Medications: Current Facility-Administered Medications  Medication Dose Route Frequency Provider Last Rate Last Dose  . acetaminophen (TYLENOL) tablet 650 mg  650 mg Oral Q6H PRN Kendrick FriesMeghan Blankmann, NP   650 mg at 04/02/13 2003  . alum & mag hydroxide-simeth (MAALOX/MYLANTA) 200-200-20 MG/5ML suspension 30 mL  30 mL Oral PRN Meghan Blankmann, NP      . alum & mag hydroxide-simeth (MAALOX/MYLANTA) 200-200-20 MG/5ML suspension 30 mL  30 mL Oral Q4H PRN Kendrick FriesMeghan Blankmann, NP      . Melene Muller[START ON 04/06/2013] citalopram (CELEXA) tablet 20 mg  20 mg Oral Daily Nanine MeansJamison Lord, NP      . ibuprofen (ADVIL,MOTRIN) tablet 600 mg  600 mg Oral Q8H PRN Meghan Blankmann, NP      . influenza vac split quadrivalent PF (FLUARIX) injection 0.5 mL  0.5 mL Intramuscular Tomorrow-1000 Sagan Wurzel      . magnesium hydroxide (MILK OF MAGNESIA) suspension 30 mL  30 mL Oral Daily PRN Meghan Blankmann, NP      . nicotine (NICODERM CQ - dosed in mg/24 hours) patch 21 mg  21 mg Transdermal Daily Kendrick FriesMeghan Blankmann, NP  21 mg at 04/05/13 0815  . nitrofurantoin (macrocrystal-monohydrate) (MACROBID) capsule 100 mg  100 mg Oral Q12H Fransisca Kaufmann, NP   100 mg at 04/05/13 0816  . OLANZapine zydis (ZYPREXA) disintegrating tablet 10 mg  10 mg Oral Q8H PRN Fransisca Kaufmann, NP   10 mg at 04/04/13 2130  . ondansetron (ZOFRAN) tablet 4 mg  4 mg Oral Q8H PRN Meghan Blankmann, NP      . phenazopyridine (PYRIDIUM) tablet 100 mg  100 mg Oral TID PRN Fransisca Kaufmann, NP      . pneumococcal 23 valent vaccine (PNU-IMMUNE) injection 0.5 mL  0.5 mL Intramuscular  Tomorrow-1000 Robyn Nohr      . traZODone (DESYREL) tablet 50 mg  50 mg Oral QHS PRN Fransisca Kaufmann, NP   50 mg at 04/04/13 2130    Lab Results:  Results for orders placed during the hospital encounter of 04/02/13 (from the past 48 hour(s))  HIV ANTIBODY (ROUTINE TESTING)     Status: None   Collection Time    04/03/13  7:47 PM      Result Value Range   HIV NON REACTIVE  NON REACTIVE   Comment: Performed at [facility]  RPR     Status: None   Collection Time    04/03/13  7:47 PM      Result Value Range   RPR NON REACTIVE  NON REACTIVE   Comment: Performed at [facility]  URINALYSIS, ROUTINE W REFLEX MICROSCOPIC     Status: Abnormal   Collection Time    04/03/13  8:14 PM      Result Value Range   Color, Urine YELLOW  YELLOW   APPearance TURBID (*) CLEAR   Specific Gravity, Urine 1.035 (*) 1.005 - 1.030   pH 6.0  5.0 - 8.0   Glucose, UA NEGATIVE  NEGATIVE mg/dL   Hgb urine dipstick NEGATIVE  NEGATIVE   Bilirubin Urine NEGATIVE  NEGATIVE   Ketones, ur NEGATIVE  NEGATIVE mg/dL   Protein, ur NEGATIVE  NEGATIVE mg/dL   Urobilinogen, UA 2.0 (*) 0.0 - 1.0 mg/dL   Nitrite NEGATIVE  NEGATIVE   Leukocytes, UA NEGATIVE  NEGATIVE   Comment: Performed at [facility]  URINE MICROSCOPIC-ADD ON     Status: Abnormal   Collection Time    04/03/13  8:14 PM      Result Value Range   Bacteria, UA MANY (*) RARE   Comment: Performed at [facility]    Physical Findings: AIMS: Facial and Oral Movements Muscles of Facial Expression: None, normal Lips and Perioral Area: None, normal Jaw: None, normal Tongue: None, normal,Extremity Movements Upper (arms, wrists, hands, fingers): None, normal Lower (legs, knees, ankles, toes): None, normal, Trunk Movements Neck, shoulders, hips: None, normal, Overall Severity Severity of abnormal movements (highest score from questions above): None, normal Incapacitation due to abnormal movements: None,  normal Patient's awareness of abnormal movements (rate only patient's report): No Awareness, Dental Status Current problems with teeth and/or dentures?: No Does patient usually wear dentures?: No  CIWA:  CIWA-Ar Total: 0 COWS:  COWS Total Score: 0  Treatment Plan Summary: Daily contact with patient to assess and evaluate symptoms and progress in treatment Medication management  Plan:  Review of chart, vital signs, medications, and notes. 1-Individual and group therapy 2-Medication management for depression and anxiety:  Medications reviewed with the patient and she stated no untoward effects, Celexa increased from 10 mg to 20 mg for depression 3-Coping skills for  depression, anxiety, and psychosis 4-Continue crisis stabilization and management 5-Address health issues--monitoring vital signs, slightly elevated blood pressure at times, will continue to monitor 6-Treatment plan in progress to prevent relapse of depression, psychosis, and anxiety  Medical Decision Making Problem Points:  Established problem, stable/improving (1) and Review of psycho-social stressors (1) Data Points:  Review of new medications or change in dosage (2)  I certify that inpatient services furnished can reasonably be expected to improve the patient's condition.   Nanine Means, PMH-NP 04/05/2013, 10:31 AM  I agreed with findings and treatment plan of this patient Thedore Mins, MD

## 2013-04-05 NOTE — Progress Notes (Signed)
Patient ID: Kimberly Contreras, female   DOB: 1985-04-26, 26 y.o.   MRN: [id number] D: pt. Reports "I'm taking my medicine, everybody friendly, can talk about anything." Pt. Reports plans to test for GED on the 30th. A: Writer introduced self to client and encouraged group. Writer provided emotional support, encouraged her to continue with positive plans. R: Pt. Is safe on the unit and attended group.

## 2013-04-05 NOTE — [facility] Group Notes (Signed)
[facility]   04/05/2013 8:13 AM  Participation Quality:  Engaged  Mood/Affect:  Depressed  Depression Rating:  Denies  Anxiety Rating:  Denies  Thoughts of Suicide:  No Will you contract for safety?   NA  Current AVH:  No  Plan for Discharge/Comments:  Kimberly Contreras admits to psychosis previous to admission, and denies depression but is depressed in appearance.  Appears to be minimizing symptoms.  Not working, not in school, no income, stays with a friend.  Plans to return there at d/c.  Transportation Means: bus  Supports: friend  Kiribatiorth, [place]ney B

## 2013-04-05 NOTE — Progress Notes (Signed)
D: Pt denies SI/HI/AVH. Pt denies feeling depressed today. Pt presents with flat affect, depressed mood and minimal interaction on the milieu. No side effects to meds verbalized by pt today. Per pt, she signed a 72 hour form while in the ED. No documents in pt chart indicating that pt signed a 72 hour discharge form. Pt given a 72 hour form this morning  and writer read 72 hour discharge form to pt and explained the process to pt. Pt verbalized understanding and signed the formed. A: Medications administered as ordered per MD. Verbal support given. Pt encouraged to attend groups. 15 minute checks performed for safety. R: Pt safety maintained.

## 2013-04-05 NOTE — ED Provider Notes (Signed)
Medical screening examination/treatment/procedure(s) were performed by non-physician practitioner and as supervising physician I was immediately available for consultation/collaboration.   Aily Tzeng T Karalee Hauter, MD 04/05/13 0718 

## 2013-04-06 NOTE — [facility] Group Notes (Signed)
[facility] LCSW Group Therapy  04/06/2013 , 12:37 PM   Type of Therapy:  Group Therapy  Participation Level:  None  Participation Quality:  Attentive  Affect:  Appropriate  Cognitive:  Alert  Insight:  Limited  Engagement in Therapy:  None  Modes of Intervention:  Discussion, Exploration and Socialization  Summary of Progress/Problems: Today's group focused on the term Diagnosis.  Participants were asked to define the term, and then pronounce whether it is a negative, positive or neutral term.  Anyelin sat on the floor in the sun.  She did not contribute to the conversation.  Ida Rogueorth, Idalia Allbritton B 04/06/2013 , 12:37 PM

## 2013-04-06 NOTE — Progress Notes (Signed)
Patient ID: Kimberly Contreras, female   DOB: 02/02/1986, 28 y.o.   MRN: [id number] [facility] MD Progress Note  04/06/2013 10:36 AM Kimberly Contreras  MRN:  [id number] Subjective:   "I am feeling less depressed and I am no longer hearing voices or suicidal.'' Objective:  Patient  report decreased  symptoms of depression and anxiety. She denies suicidal/homicidal thoughts, psychosis or delusions. She claimed that her medications are working well for her, she states that she has been feeling more motivated to look for job and complete her GED. The patient is visible on the unit and attending the scheduled groups. She is compliant with her medication regimen and denies any adverse effects.   Diagnosis:   DSM5: Schizophrenia Disorders:  Obsessive-Compulsive Disorders:  Trauma-Stressor Disorders:  Substance/Addictive Disorders: Cannabis Use Disorder - Moderate 9304.30)  Depressive Disorders: Major Depressive Disorder - Severe (296.23)  AXIS I: Generalized Anxiety Disorder,             Major Depression, Recurrent severe with psychosis .            Cannabis abuse  AXIS II: Deferred  AXIS III:  Past Medical History   Diagnosis  Date   .  Abscess    .  Depression    .  Obesity    .  Anxiety     AXIS IV: economic problems, occupational problems, other psychosocial or environmental problems, problems related to social environment and problems with primary support group  AXIS V: 41-50 serious symptoms  ADL's:  Intact  Sleep: Fair  Appetite:  Good  Suicidal Ideation:  Passive SI with no plan  Homicidal Ideation:  Denies  AEB (as evidenced by):  Psychiatric Specialty Exam: Review of Systems  Constitutional: Negative.   HENT: Negative.   Eyes: Negative.   Respiratory: Negative.   Cardiovascular: Negative.   Gastrointestinal: Negative.   Genitourinary: Positive for dysuria and frequency.  Musculoskeletal: Negative.   Skin: Negative.   Neurological: Negative.   Endo/Heme/Allergies: Negative.    Psychiatric/Behavioral: Positive for depression and substance abuse. Negative for suicidal ideas and hallucinations. The patient is nervous/anxious.     Blood pressure 154/72, pulse 95, temperature 98 F (36.7 C), temperature source Oral, resp. rate 20, height 5\' 8"  (1.727 m), weight 135.172 kg (298 lb), last menstrual period 03/03/2013, SpO2 98.00%.Body mass index is 45.32 kg/(m^2).  General Appearance: Casual  Eye Contact::  Good  Speech:  Clear and Coherent  Volume:  Decreased  Mood:  Depressed  Affect:  Flat  Thought Process:  Goal Directed  Orientation:  Full (Time, Place, and Person)  Thought Content:  WDL  Suicidal Thoughts:  denies  Homicidal Thoughts:  No  Memory:  Immediate;   Good Recent;   Good Remote;   Good  Judgement: marginal  Insight:  Shallow  Psychomotor Activity:  Decreased  Concentration:  Fair  Recall:  Good  Akathisia:  No  Handed:  Right  AIMS (if indicated):     Assets:  Communication Skills Desire for Improvement Leisure Time Physical Health Resilience  Sleep:  Number of Hours: 6.75   Current Medications: Current Facility-Administered Medications  Medication Dose Route Frequency Provider Last Rate Last Dose  . acetaminophen (TYLENOL) tablet 650 mg  650 mg Oral Q6H PRN Kendrick Fries, NP   650 mg at 04/02/13 2003  . alum & mag hydroxide-simeth (MAALOX/MYLANTA) 200-200-20 MG/5ML suspension 30 mL  30 mL Oral PRN Kendrick Fries, NP      . alum & mag hydroxide-simeth (MAALOX/MYLANTA) 200-200-20 MG/5ML  suspension 30 mL  30 mL Oral Q4H PRN Meghan Blankmann, NP      . citalopram (CELEXA) tablet 20 mg  20 mg Oral Daily Nanine MeansJamison Lord, NP   20 mg at 04/06/13 0744  . ibuprofen (ADVIL,MOTRIN) tablet 600 mg  600 mg Oral Q8H PRN Meghan Blankmann, NP      . magnesium hydroxide (MILK OF MAGNESIA) suspension 30 mL  30 mL Oral Daily PRN Meghan Blankmann, NP      . nicotine (NICODERM CQ - dosed in mg/24 hours) patch 21 mg  21 mg Transdermal Daily Meghan Blankmann,  NP   21 mg at 04/06/13 0743  . nitrofurantoin (macrocrystal-monohydrate) (MACROBID) capsule 100 mg  100 mg Oral Q12H Fransisca KaufmannLaura Davis, NP   100 mg at 04/06/13 0744  . OLANZapine zydis (ZYPREXA) disintegrating tablet 10 mg  10 mg Oral Q8H PRN Fransisca KaufmannLaura Davis, NP   10 mg at 04/04/13 2130  . ondansetron (ZOFRAN) tablet 4 mg  4 mg Oral Q8H PRN Meghan Blankmann, NP      . phenazopyridine (PYRIDIUM) tablet 100 mg  100 mg Oral TID PRN Fransisca KaufmannLaura Davis, NP      . traZODone (DESYREL) tablet 50 mg  50 mg Oral QHS PRN Fransisca KaufmannLaura Davis, NP   50 mg at 04/05/13 2128    Lab Results:  No results found for this or any previous visit (from the past 48 hour(s)).  Physical Findings: AIMS: Facial and Oral Movements Muscles of Facial Expression: None, normal Lips and Perioral Area: None, normal Jaw: None, normal Tongue: None, normal,Extremity Movements Upper (arms, wrists, hands, fingers): None, normal Lower (legs, knees, ankles, toes): None, normal, Trunk Movements Neck, shoulders, hips: None, normal, Overall Severity Severity of abnormal movements (highest score from questions above): None, normal Incapacitation due to abnormal movements: None, normal Patient's awareness of abnormal movements (rate only patient's report): No Awareness, Dental Status Current problems with teeth and/or dentures?: No Does patient usually wear dentures?: No  CIWA:  CIWA-Ar Total: 0 COWS:  COWS Total Score: 0  Treatment Plan Summary: Daily contact with patient to assess and evaluate symptoms and progress in treatment Medication management  Plan:Continue crisis management and stabilization.  Medication management:  Reviewed with patient who states no untoward effects. Continue Celexa 20 mg daily for depression and anxiety.  Encouraged patient to attend groups and participate in group counseling sessions and activities.  Discharge plan in progress.  Continue current treatment plan.  Address health issues: Vitals reviewed and stable. Reviewed  UA results which show many bacteria plus patent reports symptoms of UTI. Continue Macrodantin 100 mg every six hours times seven days. Order pyridium prn for dysuria.   Medical Decision Making Problem Points:  Established problem, stable/improving (1) and Review of psycho-social stressors (1) Data Points:  Review or order clinical lab tests (1) Review of medication regiment & side effects (2) Review of new medications or change in dosage (2)  I certify that inpatient services furnished can reasonably be expected to improve the patient's condition.   Thedore MinsAkintayo, Juwon Scripter, MD 04/06/2013, 10:36 AM

## 2013-04-06 NOTE — [facility] Group Notes (Signed)
[facility] Group Notes:  (Nursing/MHT/Case Management/Adjunct)  Date:  04/06/2013  Time: 900 am  Type of Therapy:  Nurse Education  Participation Level:  Active  Participation Quality:  Appropriate  Affect:  Appropriate  Cognitive:  Alert and Appropriate  Insight:  Appropriate  Engagement in Group:  Engaged  Modes of Intervention:  Discussion, Education and Support  Summary of Progress/Problems:Wellness group, Taking better care of yourself. "continue to take my meds and find me a job".   Carlethia Mesquita L 04/06/2013, 11:35 AM

## 2013-04-06 NOTE — Tx Team (Signed)
  Interdisciplinary Treatment Plan Update   Date Reviewed:  04/06/2013  Time Reviewed:  4:08 PM  Progress in Treatment:   Attending groups: Yes Participating in groups: Yes Taking medication as prescribed: Yes  Tolerating medication: Yes Family/Significant other contact made: Yes  Patient understands diagnosis: Yes  Discussing patient identified problems/goals with staff: Yes Medical problems stabilized or resolved: Yes Denies suicidal/homicidal ideation: Yes Patient has not harmed self or others: Yes  For review of initial/current patient goals, please see plan of care.  Estimated Length of Stay:  Likely d/c tomorrow  Reason for Continuation of Hospitalization:   New Problems/Goals identified:  N/A  Discharge Plan or Barriers:   return home, follow up outpt  Additional Comments:  Attendees:  Signature: Thedore MinsMojeed Akintayo, MD 04/06/2013 4:08 PM   Signature: Richelle Itood Brendy Ficek, LCSW 04/06/2013 4:08 PM  Signature: Fransisca KaufmannLaura Davis, NP 04/06/2013 4:08 PM  Signature: Joslyn Devonaroline Beaudry, RN 04/06/2013 4:08 PM  Signature: Liborio NixonPatrice White, RN 04/06/2013 4:08 PM  Signature:  04/06/2013 4:08 PM  Signature:   04/06/2013 4:08 PM  Signature:    Signature:    Signature:    Signature:    Signature:    Signature:      Scribe for Treatment Team:   Richelle Itood Lashayla Armes, LCSW  04/06/2013 4:08 PM

## 2013-04-06 NOTE — Progress Notes (Signed)
D: Pt denies SI/HI/AVH. Pt denies feeling depressed today. Pt denies having an altercation with her boyfriend and stated that she plans on returning back there after discharge. Pt stated that she is able to focus better now and feels like the meds are working. Pt presents with flat affect and depressed mood. Pt is compliant with taking meds and attending groups. No complaints verbalized by pt at this time. A: medications administered as ordered per MD. Verbal support given. Pt encouraged to attend groups. 15 minute checks performed for safety. R: Pt safety maintained. No complaints verbalized by pt at this time.

## 2013-04-06 NOTE — Progress Notes (Signed)
Adult Psychoeducational Group Note  Date:  04/06/2013 Time:  12:38 AM  Group Topic/Focus:  Wrap-Up Group:   The focus of this group is to help patients review their daily goal of treatment and discuss progress on daily workbooks.  Participation Level:  Minimal  Participation Quality:  Appropriate  Affect:  Flat  Cognitive:  Lacking  Insight: Limited  Engagement in Group:  Supportive  Modes of Intervention:  Support  Additional Comments:Patient attended and participated in group tonight. She reports that she attended all her groups, she eat well, wash her hair and was in good spirit today. For her wellness she would like to get her GED, take her medication and follow-up with her counseling appointment.  Lita MainsFrancis, Dick Hark [facility] 04/06/2013, 12:38 AM

## 2013-04-06 NOTE — Progress Notes (Signed)
D: Pt denies SI/HI/AVH. Pt is pleasant and cooperative. Pt stated she will be following up with her psychiatrist and taking her meds when she leaves.   A: Pt was offered support and encouragement. Pt was given scheduled medications. Pt was encourage to attend groups. Q 15 minute checks were done for safety.   R:Pt attends groups and interacts well with peers and staff. Pt is taking medication. Pt has no complaints at this time.Pt receptive to treatment and safety maintained on unit.

## 2013-04-06 NOTE — Progress Notes (Signed)
[facility] Group Notes:  (Nursing/MHT/Case Management/Adjunct)  Date:  04/06/2013  Time:  8:00 p.m.   Type of Therapy:  Psychoeducational Skills  Participation Level:  Minimal  Participation Quality:  Resistant  Affect:  Blunted  Cognitive:  Lacking  Insight:  Lacking  Engagement in Group:  Resistant  Modes of Intervention:  Education  Summary of Progress/Problems: The patient described her day as having been "all right". The patient states that she will be discharged tomorrow. She offered very little in group this evening. In terms of the theme for the day, she intends to take her medication and stay focused as a means of recovery.   [name] S 04/06/2013, 10:08 PM

## 2013-04-07 MED ORDER — NITROFURANTOIN MONOHYD MACRO 100 MG PO CAPS
100.0000 mg | ORAL_CAPSULE | Freq: Two times a day (BID) | ORAL | Status: DC
Start: 2013-04-07 — End: 2013-08-15

## 2013-04-07 MED ORDER — TRAZODONE HCL 50 MG PO TABS: 50.0000 mg | ORAL_TABLET | Freq: Every evening | ORAL | Status: DC | PRN

## 2013-04-07 MED ORDER — CITALOPRAM HYDROBROMIDE 20 MG PO TABS: 20.0000 mg | ORAL_TABLET | Freq: Every day | ORAL | Status: DC

## 2013-04-07 NOTE — Progress Notes (Signed)
[facility] :  Will you be returning to the same living situation after discharge: Yes,  home At discharge, do you have transportation home?:Yes,  bus Do you have the ability to pay for your medications:Yes,  mental health  Release of information consent forms completed and in the chart;  Patient's signature needed at discharge.  Patient to Follow up at: Follow-up Information   Follow up with [facility]. (Go to the walk-in clinic M-F between 8 and 9AM for your hospital follow up appointment)    Contact information:   [address]  [place]  [phone]      Patient denies SI/HI:   Yes,  yes    Safety Planning and Suicide Prevention discussed:  Yes,  yes  Ida Rogueorth, Koleen Celia B 04/07/2013, 10:19 AM

## 2013-04-07 NOTE — Progress Notes (Signed)
Discharge note: Pt received both written and verbal discharge instructions. Pt agreed to f/u  appt and med regimen. Pt denies SI/HI/AVH at this time. Pt received belongs from room and locker. Pt given sample meds, prescriptions and bus pass. Pt safely left [facility].

## 2013-04-07 NOTE — [facility] Suicide Risk Assessment (Addendum)
Suicide Risk Assessment  Discharge Assessment     Demographic Factors:  Low socioeconomic status, Unemployed and Female  Mental Status Per Nursing Assessment::   On Admission:  Self-harm thoughts  Current Mental Status by Physician: patient denies suicidal ideation, intent or plan  Loss Factors: Financial problems/change in socioeconomic status  Historical Factors: Impulsivity  Risk Reduction Factors:   Sense of responsibility to family and Living with another person, especially a relative  Continued Clinical Symptoms:  Resolving depression and psychosis  Cognitive Features That Contribute To Risk:  Closed-mindedness Polarized thinking    Suicide Risk:  Minimal: No identifiable suicidal ideation.  Patients presenting with no risk factors but with morbid ruminations; may be classified as minimal risk based on the severity of the depressive symptoms  Discharge Diagnoses:   AXIS I:  MDD (major depressive disorder), recurrent severe, with psychosis              Generalized anxiety disorder  AXIS II:  Deferred AXIS III:   Past Medical History  Diagnosis Date  . Abscess   . Obesity    AXIS IV:  economic problems, other psychosocial or environmental problems and problems related to social environment AXIS V:  61-70 mild symptoms  Plan Of Care/Follow-up recommendations:  Activity:  as tolerated Diet:  healthy Tests:  routine blood work up Other:  patient to keep her after care appointment  Is patient on multiple antipsychotic therapies at discharge:  No   Has Patient had three or more failed trials of antipsychotic monotherapy by history:  No  Recommended Plan for Multiple Antipsychotic Therapies: NA  Thedore MinsAkintayo, Wavie Hashimi, MD 04/07/2013, 9:23 AM

## 2013-04-07 NOTE — [facility] Suicide Risk Assessment (Signed)
[facility] INPATIENT:  Family/Significant Other Suicide Prevention Education  Suicide Prevention Education:  Education Completed; No one has been identified by the patient as the family member/significant other with whom the patient will be residing, and identified as the person(s) who will aid the patient in the event of a mental health crisis (suicidal ideations/suicide attempt).  With written consent from the patient, the family member/significant other has been provided the following suicide prevention education, prior to the and/or following the discharge of the patient.  The suicide prevention education provided includes the following:  Suicide risk factors  Suicide prevention and interventions  National Suicide Hotline telephone number  [facility] assessment telephone number  [facility] Emergency Assistance 911  [facility] and/or Residential Mobile Crisis Unit telephone number  Request made of family/significant other to:  Remove weapons (e.g., guns, rifles, knives), all items previously/currently identified as safety concern.    Remove drugs/medications (over-the-counter, prescriptions, illicit drugs), all items previously/currently identified as a safety concern.  The family member/significant other verbalizes understanding of the suicide prevention education information provided.  The family member/significant other agrees to remove the items of safety concern listed above. The patient did not endorse SI at the time of admission, nor did the patient c/o SI during the stay here.  SPE not required.   Daryel Geraldorth, Ina Poupard B 04/07/2013, 10:19 AM

## 2013-04-07 NOTE — Discharge Summary (Signed)
Physician Discharge Summary Note  Patient:  Kimberly Contreras is an 28 y.o., female MRN:  [id number] DOB:  09-Apr-1985 Patient phone:  [phone] (home)  Patient address:   [address] [place] [address],   Date of Admission:  04/02/2013 Date of Discharge: 04/07/13  Reason for Admission:  Depression, Anxiety    Discharge Diagnoses: Principal Problem:   MDD (major depressive disorder), recurrent severe, with psychosis Active Problems:   Psychosis  Review of Systems  Constitutional: Negative.   HENT: Negative.   Eyes: Negative.   Respiratory: Negative.   Cardiovascular: Negative.   Gastrointestinal: Negative.   Genitourinary: Negative.   Musculoskeletal: Negative.   Skin: Negative.   Neurological: Negative.   Endo/Heme/Allergies: Negative.   Psychiatric/Behavioral: Negative for depression, suicidal ideas, hallucinations, memory loss and substance abuse. The patient is not nervous/anxious and does not have insomnia.     DSM5: AXIS I: MDD (major depressive disorder), recurrent severe, with psychosis  Generalized anxiety disorder  AXIS II: Deferred  AXIS III:  Past Medical History   Diagnosis  Date   .  Abscess    .  Obesity    AXIS IV: economic problems, other psychosocial or environmental problems and problems related to social environment  AXIS V: 61-70 mild symptoms   Level of Care:  OP  Hospital Course:  Kimberly Contreras is an 28 y.o. Single AA female admitted voluntarily and emergently from [facility] for increased depression and auditory hallucinations. Patient states "I have psychological problems. I can't focus during the day. I can't find a job or go to school. I am trying to get my GED. Sometimes I hear somebody telling me that I am stupid. Just feeling so hopeless. Broke up with my boyfriend a few days ago. I called [facility] to take me to the hospital because I was just freaking out so bad. I was smoking cigarettes back to back, pacing and crying." The patient admits to  smoking marijuana to self medicate and alcohol. Aquita reports that she has not drank any alcohol in two weeks. Patient endorses increasing panic attacks with the last being on 03/31/13. The patient is very concerned about her inability to to focus and would like medicine to help with this.          Kimberly Contreras was admitted to the adult unit where she was evaluated and her symptoms were identified. Medication management was discussed and implemented. The patient was not taking any psychiatric medications prior to her admission. The patient reported that she only heard voices when she was abusing substances. Patient was started on Celexa 20 mg daily to address symptoms of depression and poor attention span.  The patient began to report improvement in her depression stating "I'm starting to feel happy." She began to deny the symptoms that had been identified on admission. Her behavior on the unit was appropriate with no reported mood lability. She was encouraged to participate in unit programming. Medical problems were identified and treated appropriately. Patient received treatment for a UTI with a course of Macrobid.  Kimberly Contreras was evaluated each day by a clinical provider to ascertain the patient's response to treatment.  Improvement was noted by the patient's report of decreasing symptoms, improved sleep and appetite, affect, medication tolerance, behavior, and participation in unit programming.  Kimberly Contreras was asked each day to complete a self inventory noting mood, mental status, pain, new symptoms, anxiety and concerns.         She responded well to medication and  being in a therapeutic and supportive environment. Positive and appropriate behavior was noted and the patient was motivated for recovery.  Kimberly Contreras worked closely with the treatment team and case manager to develop a discharge plan with appropriate goals. Coping skills, problem solving as well as relaxation therapies were also part of the  unit programming.         By the day of discharge Kimberly Contreras was in much improved condition than upon admission.  Symptoms were reported as significantly decreased or resolved completely.  The patient denied SI/HI and voiced no AVH. She was motivated to continue taking medication with a goal of continued improvement in mental health.          Kimberly Contreras was discharged home with a plan to follow up as noted below. Her goal after discharge was identified as continuing to take her medications and to find a job. Patient was encouraged to see her MD if symptoms of UTI persisted after finishing her treatment. She was provided with prescription for medication and seven doses of Macrobid to complete course of antibiotic.   Consults:  None  Significant Diagnostic Studies:  labs: Admission labs completed, UA positive for UTI   Discharge Vitals:   Blood pressure 154/72, pulse 95, temperature 98 F (36.7 C), temperature source Oral, resp. rate 20, height 5\' 8"  (1.727 m), weight 135.172 kg (298 lb), last menstrual period 03/03/2013, SpO2 98.00%. Body mass index is 45.32 kg/(m^2). Lab Results:   No results found for this or any previous visit (from the past 72 hour(s)).  Physical Findings: AIMS: Facial and Oral Movements Muscles of Facial Expression: None, normal Lips and Perioral Area: None, normal Jaw: None, normal Tongue: None, normal,Extremity Movements Upper (arms, wrists, hands, fingers): None, normal Lower (legs, knees, ankles, toes): None, normal, Trunk Movements Neck, shoulders, hips: None, normal, Overall Severity Severity of abnormal movements (highest score from questions above): None, normal Incapacitation due to abnormal movements: None, normal Patient's awareness of abnormal movements (rate only patient's report): No Awareness, Dental Status Current problems with teeth and/or dentures?: No Does patient usually wear dentures?: No  CIWA:  CIWA-Ar Total: 0 COWS:  COWS Total Score:  0  Psychiatric Specialty Exam: See Psychiatric Specialty Exam and Suicide Risk Assessment completed by Attending Physician prior to discharge.  Discharge destination:  Home  Is patient on multiple antipsychotic therapies at discharge:  No   Has Patient had three or more failed trials of antipsychotic monotherapy by history:  No  Recommended Plan for Multiple Antipsychotic Therapies: NA  Discharge Orders   Future Orders Complete By Expires   Discharge instructions  As directed    Comments:     Please finish the course of Macrobid that was provided to finish treatment for your urinary tract infection. Follow up with your Primary Care Provider if your symptoms continue to persist.       Medication List       Indication   citalopram 20 MG tablet  Commonly known as:  CELEXA  Take 1 tablet (20 mg total) by mouth daily. For depression.   Indication:  Depression, Anxiety and poor focus     nitrofurantoin (macrocrystal-monohydrate) 100 MG capsule  Commonly known as:  MACROBID  Take 1 capsule (100 mg total) by mouth every 12 (twelve) hours.   Indication:  Urinary Tract Infection     traZODone 50 MG tablet  Commonly known as:  DESYREL  Take 1 tablet (50 mg total) by mouth at bedtime as needed  for sleep.   Indication:  Trouble Sleeping           Follow-up Information   Follow up with [facility]. (Go to the walk-in clinic M-F between 8 and 9AM for your hospital follow up appointment)    Contact information:   [address]  [place]  [phone]      Follow-up recommendations:  Activity: as tolerated  Diet: healthy  Tests: routine blood work up  Other: patient to keep her after care appointment  Comments:   Take all your medications as prescribed by your mental healthcare provider.  Report any adverse effects and or reactions from your medicines to your outpatient provider promptly.  Patient is instructed and cautioned to not engage in alcohol and or illegal drug use  while on prescription medicines.  In the event of worsening symptoms, patient is instructed to call the crisis hotline, 911 and or go to the nearest ED for appropriate evaluation and treatment of symptoms.  Follow-up with your primary care provider for your other medical issues, concerns and or health care needs.   Total Discharge Time:  Greater than 30 minutes.  SignedFransisca Kaufmann NP-C 04/07/2013, 9:30 AM  I agreed with findings and treatment plan of this patient Thedore Mins, MD

## 2013-04-12 NOTE — Progress Notes (Signed)
Patient Discharge Instructions:  After Visit Summary (AVS):   Faxed to:  04/12/13 Discharge Summary Note:   Faxed to:  04/12/13 Psychiatric Admission Assessment Note:   Faxed to:  04/12/13 Suicide Risk Assessment - Discharge Assessment:   Faxed to:  04/12/13 Faxed/Sent to the Next Level Care provider:  04/12/13 [facility] @ [phone]  Jerelene ReddenSheena E [facility], 04/12/2013, 4:21 PM

## 2013-08-15 ENCOUNTER — Emergency Department (HOSPITAL_COMMUNITY)
Admission: EM | Admit: 2013-08-15 | Discharge: 2013-08-15 | Disposition: A | Discharge status: Home / Self Care | Payer: Self-pay | Attending: Emergency Medicine | Admitting: Emergency Medicine

## 2013-08-15 ENCOUNTER — Encounter (HOSPITAL_COMMUNITY): Payer: Self-pay | Admitting: Emergency Medicine

## 2013-08-15 DIAGNOSIS — Z862 Personal history of diseases of the blood and blood-forming organs and certain disorders involving the immune mechanism: Secondary | ICD-10-CM | POA: Insufficient documentation

## 2013-08-15 DIAGNOSIS — F411 Generalized anxiety disorder: Secondary | ICD-10-CM

## 2013-08-15 DIAGNOSIS — F323 Major depressive disorder, single episode, severe with psychotic features: Secondary | ICD-10-CM

## 2013-08-15 DIAGNOSIS — F17200 Nicotine dependence, unspecified, uncomplicated: Secondary | ICD-10-CM | POA: Insufficient documentation

## 2013-08-15 DIAGNOSIS — Z8639 Personal history of other endocrine, nutritional and metabolic disease: Secondary | ICD-10-CM | POA: Insufficient documentation

## 2013-08-15 DIAGNOSIS — Z3202 Encounter for pregnancy test, result negative: Secondary | ICD-10-CM | POA: Insufficient documentation

## 2013-08-15 DIAGNOSIS — F332 Major depressive disorder, recurrent severe without psychotic features: Secondary | ICD-10-CM | POA: Diagnosis present

## 2013-08-15 DIAGNOSIS — Z872 Personal history of diseases of the skin and subcutaneous tissue: Secondary | ICD-10-CM | POA: Insufficient documentation

## 2013-08-15 DIAGNOSIS — R45851 Suicidal ideations: Secondary | ICD-10-CM

## 2013-08-15 DIAGNOSIS — F29 Unspecified psychosis not due to a substance or known physiological condition: Secondary | ICD-10-CM

## 2013-08-15 DIAGNOSIS — E669 Obesity, unspecified: Secondary | ICD-10-CM | POA: Insufficient documentation

## 2013-08-15 DIAGNOSIS — F333 Major depressive disorder, recurrent, severe with psychotic symptoms: Secondary | ICD-10-CM | POA: Insufficient documentation

## 2013-08-15 LAB — CBC WITH DIFFERENTIAL/PLATELET
BASOS ABS: 0 10*3/uL (ref 0.0–0.1)
Basophils Relative: 0 % (ref 0–1)
EOS PCT: 3 % (ref 0–5)
Eosinophils Absolute: 0.2 10*3/uL (ref 0.0–0.7)
HEMATOCRIT: 41.6 % (ref 36.0–46.0)
Hemoglobin: 13.9 g/dL (ref 12.0–15.0)
LYMPHS ABS: 2.7 10*3/uL (ref 0.7–4.0)
LYMPHS PCT: 33 % (ref 12–46)
MCH: 28.8 pg (ref 26.0–34.0)
MCHC: 33.4 g/dL (ref 30.0–36.0)
MCV: 86.1 fL (ref 78.0–100.0)
MONOS PCT: 7 % (ref 3–12)
Monocytes Absolute: 0.6 10*3/uL (ref 0.1–1.0)
Neutro Abs: 4.6 10*3/uL (ref 1.7–7.7)
Neutrophils Relative %: 57 % (ref 43–77)
PLATELETS: 331 10*3/uL (ref 150–400)
RBC: 4.83 MIL/uL (ref 3.87–5.11)
RDW: 13.9 % (ref 11.5–15.5)
WBC: 8.1 10*3/uL (ref 4.0–10.5)

## 2013-08-15 LAB — URINALYSIS, ROUTINE W REFLEX MICROSCOPIC
GLUCOSE, UA: NEGATIVE mg/dL
Hgb urine dipstick: NEGATIVE
Ketones, ur: 40 mg/dL — AB
Leukocytes, UA: NEGATIVE
NITRITE: NEGATIVE
PH: 5.5 (ref 5.0–8.0)
Protein, ur: 30 mg/dL — AB
Specific Gravity, Urine: 1.042 — ABNORMAL HIGH (ref 1.005–1.030)
Urobilinogen, UA: 1 mg/dL (ref 0.0–1.0)

## 2013-08-15 LAB — URINE MICROSCOPIC-ADD ON

## 2013-08-15 LAB — BASIC METABOLIC PANEL
BUN: 6 mg/dL (ref 6–23)
CHLORIDE: 101 meq/L (ref 96–112)
CO2: 24 meq/L (ref 19–32)
CREATININE: 0.63 mg/dL (ref 0.50–1.10)
Calcium: 9.5 mg/dL (ref 8.4–10.5)
GFR calc Af Amer: >90 mL/min (ref >90)
GFR calc non Af Amer: >90 mL/min (ref >90)
Glucose, Bld: 72 mg/dL (ref 70–99)
POTASSIUM: 3.8 meq/L (ref 3.7–5.3)
Sodium: 137 mEq/L (ref 137–147)

## 2013-08-15 LAB — RAPID URINE DRUG SCREEN, HOSP PERFORMED
AMPHETAMINES: NOT DETECTED
BARBITURATES: NOT DETECTED
Benzodiazepines: NOT DETECTED
COCAINE: NOT DETECTED
Opiates: NOT DETECTED
Tetrahydrocannabinol: NOT DETECTED

## 2013-08-15 LAB — PREGNANCY, URINE: PREG TEST UR: NEGATIVE

## 2013-08-15 LAB — ETHANOL

## 2013-08-15 LAB — SALICYLATE LEVEL: Salicylate Lvl: 3.2 mg/dL (ref 2.8–20.0)

## 2013-08-15 MED ORDER — FLUOXETINE HCL 20 MG PO CAPS: 20.0000 mg | ORAL_CAPSULE | Freq: Every day | ORAL | Status: DC

## 2013-08-15 MED ORDER — ZOLPIDEM TARTRATE 5 MG PO TABS: 5.0000 mg | ORAL_TABLET | Freq: Every evening | ORAL | Status: DC | PRN

## 2013-08-15 MED ORDER — ACETAMINOPHEN 325 MG PO TABS: 650.0000 mg | ORAL_TABLET | ORAL | Status: DC | PRN

## 2013-08-15 MED ORDER — NICOTINE 21 MG/24HR TD PT24: 21.0000 mg | MEDICATED_PATCH | Freq: Every day | TRANSDERMAL | Status: DC

## 2013-08-15 MED ORDER — LORAZEPAM 1 MG PO TABS: 1.0000 mg | ORAL_TABLET | Freq: Three times a day (TID) | ORAL | Status: DC | PRN

## 2013-08-15 MED ORDER — ONDANSETRON HCL 4 MG PO TABS: 4.0000 mg | ORAL_TABLET | Freq: Three times a day (TID) | ORAL | Status: DC | PRN

## 2013-08-15 MED ORDER — ALUM & MAG HYDROXIDE-SIMETH 200-200-20 MG/5ML PO SUSP: 30.0000 mL | ORAL | Status: DC | PRN

## 2013-08-15 MED ORDER — IBUPROFEN 200 MG PO TABS: 600.0000 mg | ORAL_TABLET | Freq: Three times a day (TID) | ORAL | Status: DC | PRN

## 2013-08-15 MED ORDER — FLUOXETINE HCL 20 MG PO CAPS
20.0000 mg | ORAL_CAPSULE | Freq: Every day | ORAL | Status: DC
  Administered 2013-08-15: 20 mg via ORAL
  Filled 2013-08-15: qty 1

## 2013-08-15 NOTE — ED Notes (Addendum)
Pt arrived to the ED with a complaint of depression.  Pt had been to [facility] who gave her a referral to see the doctor at a later date.  Pt wants to talk to someone about her increased depression.  Pt has prescription medications but has been non compliant.  Pt denies SI/HI and appears in no apparent distress

## 2013-08-15 NOTE — ED Notes (Signed)
On the phone 

## 2013-08-15 NOTE — ED Notes (Signed)
Dr Ross and Shuvon NP into see 
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
## 2013-08-15 NOTE — ED Notes (Addendum)
Pt sleeping, easily aroused.  Pt is aware that the MD wants to start her on medication and have her stay today.  Pt verbalized understanding, but reports that she does not want to stay, and that she has a place [place], and can follow up w/ [facility]. Will inform MD.

## 2013-08-15 NOTE — ED Notes (Signed)
Pt given a cup of water 

## 2013-08-15 NOTE — ED Notes (Signed)
Written dc instructions reviewed w/ patient.  Pt encouraged to follow up tomorrow at [facility], take medication as directed, and return for any suicidal thoughts/urges.  Pt verbalized understanding and agreed to do so.  Pt denies si/hi/avh on dc.  Pt ambulatory to dc window w/ security, belongings returned after leaving the unit.

## 2013-08-15 NOTE — ED Provider Notes (Signed)
CSN: [id number]     Arrival date & time 08/15/13  0115 History   First MD Initiated Contact with Patient 08/15/13 0239     Chief Complaint  Patient presents with  . Depression     (Consider location/radiation/quality/duration/timing/severity/associated sxs/prior Treatment) HPI   Kimberly Contreras is a 28 y.o. female complaining of depression increasing over the course of the last several months patient denies suicidal ideation, homicidal ideation, drug abuse, excessive alcohol use. She does endorse auditory hallucinations states he hears voices and that she's heard him for several years. States the voices tell her to do things, she cannot describe what kind of things they tell her to do. States the voices are disturbing to her. Patient is not taking any medications. She was prescribed an antidepressant 5 months ago and self DC'd it 3 weeks after onset. He cannot identify any specific event that is causing her depression be to become worse recently. Patient denies any headache, chest pain, shortness of breath, abdominal pain, nausea vomiting, change in bowel or bladder habits she is otherwise healthy with no medical conditions.  Past Medical History  Diagnosis Date  . Abscess   . Depression   . Obesity   . Anxiety    History reviewed. No pertinent past surgical history. History reviewed. No pertinent family history. History  Substance Use Topics  . Smoking status: Current Every Day Smoker -- 1.00 packs/day    Types: Cigarettes  . Smokeless tobacco: Never Used  . Alcohol Use: 1.2 oz/week    2 Shots of liquor per week     Comment: on weekends   OB History   Grav Para Term Preterm Abortions TAB SAB Ect Mult Living                 Review of Systems  10 systems reviewed and found to be negative, except as noted in the HPI.   Allergies  Review of patient's allergies indicates no known allergies.  Home Medications   Prior to Admission medications   Medication Sig Start Date End  Date Taking? Authorizing Provider  miconazole (MICOTIN) 200 MG vaginal suppository Place 200 mg vaginally at bedtime.   Yes Historical Provider, MD   BP 125/67  Pulse 67  Temp(Src) 97.8 F (36.6 C) (Oral)  Resp 18  SpO2 100%  LMP 07/26/2013 Physical Exam  Nursing note and vitals reviewed. Constitutional: She is oriented to person, place, and time. She appears well-developed and well-nourished. No distress.  Obese  HENT:  Head: Normocephalic and atraumatic.  Mouth/Throat: Oropharynx is clear and moist.  Eyes: Conjunctivae and EOM are normal. Pupils are equal, round, and reactive to light.  Neck: Normal range of motion. Neck supple.  Cardiovascular: Normal rate, regular rhythm and intact distal pulses.   Pulmonary/Chest: Effort normal and breath sounds normal. No stridor. No respiratory distress. She has no wheezes. She has no rales. She exhibits no tenderness.  Abdominal: Soft. Bowel sounds are normal. She exhibits no distension and no mass. There is no tenderness. There is no rebound and no guarding.  Musculoskeletal: Normal range of motion.  Neurological: She is alert and oriented to person, place, and time.  Psychiatric: She is not aggressive and not hyperactive. Cognition and memory are normal. She exhibits a depressed mood. She expresses no homicidal and no suicidal ideation. She expresses no homicidal plans.  Flat affect, or eye contact, She is attentive.    ED Course  Procedures (including critical care time) Labs Review Labs Reviewed  URINALYSIS, ROUTINE  W REFLEX MICROSCOPIC - Abnormal; Notable for the following:    Color, Urine AMBER (*)    APPearance CLOUDY (*)    Specific Gravity, Urine 1.042 (*)    Bilirubin Urine SMALL (*)    Ketones, ur 40 (*)    Protein, ur 30 (*)    All other components within normal limits  URINE MICROSCOPIC-ADD ON - Abnormal; Notable for the following:    Squamous Epithelial / LPF MANY (*)    All other components within normal limits  URINE  CULTURE  PREGNANCY, URINE  CBC WITH DIFFERENTIAL  BASIC METABOLIC PANEL  ETHANOL  SALICYLATE LEVEL  URINE RAPID DRUG SCREEN (HOSP PERFORMED)    Imaging Review No results found.   EKG Interpretation None      MDM   Final diagnoses:  MDD (major depressive disorder), recurrent severe, with psychosis   Filed Vitals:   08/15/13 0131  BP: 125/67  Pulse: 67  Temp: 97.8 F (36.6 C)  TempSrc: Oral  Resp: 18  SpO2: 100%    Medications  alum & mag hydroxide-simeth (MAALOX/MYLANTA) 200-200-20 MG/5ML suspension 30 mL (not administered)  ondansetron (ZOFRAN) tablet 4 mg (not administered)  nicotine (NICODERM CQ - dosed in mg/24 hours) patch 21 mg (not administered)  zolpidem (AMBIEN) tablet 5 mg (not administered)  ibuprofen (ADVIL,MOTRIN) tablet 600 mg (not administered)  acetaminophen (TYLENOL) tablet 650 mg (not administered)  LORazepam (ATIVAN) tablet 1 mg (not administered)    Kimberly Contreras is a 28 y.o. female presenting with depression and auditory hallucinations. Patient has history of severe major depressive disorder with psychosis. Has been noncompliant with medications. Does not appear to have any drug or alcohol abuse issues or suicidal or homicidal ideation.  Blood work, urinalysis, urine drug screen unremarkable  Patient is medically cleared for psychiatric evaluation will be transferred to the psych ED. TTS consulted, home meds and psych standard holding orders placed.   TTS consulted, Stephanie Acre will arrange for psychiatric evaluation in the a.m.  Note: Portions of this report may have been transcribed using voice recognition software. Every effort was made to ensure accuracy; however, inadvertent computerized transcription errors may be present     Wynetta Emery, PA-C 08/15/13 0436  Wynetta Emery, PA-C 08/15/13 [phone]

## 2013-08-15 NOTE — Discharge Instructions (Signed)
Major Depressive Disorder °Major depressive disorder (MDD) is a mental illness. It also may be called clinical depression or unipolar depression. MDD usually causes feelings of sadness, hopelessness, or helplessness. Some people with MDD do not feel particularly sad but lose interest in doing things they used to enjoy (anhedonia). MDD also can cause physical symptoms. It can interfere with work, school, relationships, and other normal everyday activities. MDD varies in severity but is longer lasting and more serious than the sadness we all feel from time to time in our lives. °MDD often is triggered by stressful life events or major life changes. Examples of these triggers include divorce, loss of your job or home, a move, and the death of a family member or close friend. Sometimes MDD occurs for no obvious reason at all. People who have family members with MDD or bipolar disorder are at higher risk for developing MDD, with or without life stressors. MDD can occur at any age. It may occur just once in your life (single episode MDD). It may occur multiple times (recurrent MDD). °SYMPTOMS °People with MDD have either anhedonia or depressed mood on nearly a daily basis for at least 2 weeks or longer. Symptoms of depressed mood include: °· Feelings of sadness (blue or down in the dumps) or emptiness. °· Feelings of hopelessness or helplessness. °· Tearfulness or episodes of crying (may be observed by others). °· Irritability (children and adolescents). °In addition to depressed mood or anhedonia or both, people with MDD have at least four of the following symptoms: °· Difficulty sleeping or sleeping too much.   °· Significant change (increase or decrease) in appetite or weight.   °· Lack of energy or motivation. °· Feelings of guilt and worthlessness.   °· Difficulty concentrating, remembering, or making decisions. °· Unusually slow movement (psychomotor retardation) or restlessness (as observed by others).    °· Recurrent wishes for death, recurrent thoughts of self-harm (suicide), or a suicide attempt. °People with MDD commonly have persistent negative thoughts about themselves, other people, and the world. People with severe MDD may experience distorted beliefs or perceptions about the world (psychotic delusions). They also may see or hear things that are not real (psychotic hallucinations). °DIAGNOSIS °MDD is diagnosed through an assessment by your caregiver. Your caregiver will ask about aspects of your daily life, such as mood, sleep, and appetite, to see if you have the diagnostic symptoms of MDD. Your caregiver may ask about your medical history and use of alcohol or drugs, including prescription medications. Your caregiver also may do a physical exam and blood work. This is because certain medical conditions and the use of certain substances can cause MDD-like symptoms (secondary depression). Your caregiver also may refer you to a mental health specialist for further evaluation and treatment. °TREATMENT °It is important to recognize the symptoms of MDD and seek treatment. The following treatments can be prescribed for MDD:   °· Medication Antidepressant medications usually are prescribed. Antidepressant medications are thought to correct chemical imbalances in the brain that are commonly associated with MDD. Other types of medication may be added if MDD symptoms do not respond to antidepressant medications alone or if psychotic delusions or hallucinations occur. °· Talk therapy Talk therapy can be helpful in treating MDD by providing support, education, and guidance. Certain types of talk therapy also can help with negative thinking (cognitive behavioral therapy) and with relationship issues that trigger MDD (interpersonal therapy). °A mental health specialist can help determine which treatment is best for you. Most people with MDD do well with a   combination of medication and talk therapy. Treatments involving  electrical stimulation of the brain can be used in situations with extremely severe symptoms or when medication and talk therapy do not work over time. These treatments include electroconvulsive therapy, transcranial magnetic stimulation, and vagal nerve stimulation. °Document Released: 06/29/2012 Document Reviewed: 06/29/2012 °[employer]® Patient Information ©2014 [employer]. ° °

## 2013-08-15 NOTE — ED Notes (Signed)
Pt belongings:   Jeans, t-shirt, towel, tote bag, underwear, bra, flip flops, and books.  Belongings sent with pt to secured holding area.
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
## 2013-08-15 NOTE — ED Notes (Signed)
Up to the bathroom 

## 2013-08-15 NOTE — ED Provider Notes (Signed)
Medical screening examination/treatment/procedure(s) were performed by non-physician practitioner and as supervising physician I was immediately available for consultation/collaboration.   EKG Interpretation None       Olivia Mackie, MD 08/15/13 [phone]

## 2013-08-15 NOTE — [facility] Suicide Risk Assessment (Signed)
Suicide Risk Assessment  Discharge Assessment     Demographic Factors:  Female  Total Time spent with patient: 15 minutes  Psychiatric Specialty Exam:     Blood pressure 132/71, pulse 63, temperature 98.3 F (36.8 C), temperature source Oral, resp. rate 18, last menstrual period 07/26/2013, SpO2 100.00%.There is no weight on file to calculate BMI.  General Appearance: Casual  Eye Contact::  Fair  Speech:  Clear and Coherent and Normal Rate  Volume:  Decreased  Mood:  Depressed  Affect:  Congruent  Thought Process:  Circumstantial and Goal Directed  Orientation:  Full (Time, Place, and Person)  Thought Content:  :I'm feeling better"  Suicidal Thoughts:  No  Homicidal Thoughts:  No  Memory:  Immediate;   Good Recent;   Good Remote;   Good  Judgement:  Intact  Insight:  Present  Psychomotor Activity:  Normal  Concentration:  Fair  Recall:  Good  Fund of Knowledge:Good  Language: Good  Akathisia:  No  Handed:  Right  AIMS (if indicated):     Assets:  Communication Skills Desire for Improvement Housing  Sleep:       Musculoskeletal: Strength & Muscle Tone: within normal limits Gait & Station: normal Patient leans: N/A   Mental Status Per Nursing Assessment::   On Admission:     Current Mental Status by Physician: Patient denies suicidal/homicidal ideation, paranoia, and psychosis at this time  Loss Factors: NA  Historical Factors: NA  Risk Reduction Factors:   Sense of responsibility to family  Continued Clinical Symptoms:  depression  Cognitive Features That Contribute To Risk:  None noted    Suicide Risk:  Minimal: No identifiable suicidal ideation.  Patients presenting with no risk factors but with morbid ruminations; may be classified as minimal risk based on the severity of the depressive symptoms  Discharge Diagnoses:   AXIS I:  Major depressive disorder with psychosis AXIS II:  Deferred AXIS III:   Past Medical History  Diagnosis Date   . Abscess   . Depression   . Obesity   . Anxiety    AXIS IV:  other psychosocial or environmental problems AXIS V:  61-70 mild symptoms  Plan Of Care/Follow-up recommendations:  Activity:  Resume usual activity Diet:  Resume usual diet  Is patient on multiple antipsychotic therapies at discharge:  No   Has Patient had three or more failed trials of antipsychotic monotherapy by history:  No  Recommended Plan for Multiple Antipsychotic Therapies: NA   Shuvon Rankin, FNP-BC 08/15/2013, 11:38 AM  Patient seen and I agree with treatment and plan Diannia Ruder MD

## 2013-08-15 NOTE — ED Notes (Signed)
TTS at bedside. 

## 2013-08-15 NOTE — ED Notes (Signed)
Up tot he bathroom to shower and change scrubs 

## 2013-08-16 LAB — URINE CULTURE

## 2013-11-09 ENCOUNTER — Emergency Department (HOSPITAL_COMMUNITY)
Admission: EM | Admit: 2013-11-09 | Discharge: 2013-11-09 | Disposition: A | Discharge status: Home / Self Care | Payer: Self-pay | Attending: Emergency Medicine | Admitting: Emergency Medicine

## 2013-11-09 ENCOUNTER — Encounter (HOSPITAL_COMMUNITY): Payer: Self-pay | Admitting: Emergency Medicine

## 2013-11-09 DIAGNOSIS — Z3202 Encounter for pregnancy test, result negative: Secondary | ICD-10-CM | POA: Insufficient documentation

## 2013-11-09 DIAGNOSIS — F17200 Nicotine dependence, unspecified, uncomplicated: Secondary | ICD-10-CM | POA: Insufficient documentation

## 2013-11-09 DIAGNOSIS — R45851 Suicidal ideations: Secondary | ICD-10-CM | POA: Insufficient documentation

## 2013-11-09 DIAGNOSIS — Z008 Encounter for other general examination: Secondary | ICD-10-CM | POA: Insufficient documentation

## 2013-11-09 DIAGNOSIS — E669 Obesity, unspecified: Secondary | ICD-10-CM | POA: Insufficient documentation

## 2013-11-09 DIAGNOSIS — Z872 Personal history of diseases of the skin and subcutaneous tissue: Secondary | ICD-10-CM | POA: Insufficient documentation

## 2013-11-09 DIAGNOSIS — F3289 Other specified depressive episodes: Secondary | ICD-10-CM | POA: Insufficient documentation

## 2013-11-09 DIAGNOSIS — F32A Depression, unspecified: Secondary | ICD-10-CM | POA: Diagnosis present

## 2013-11-09 DIAGNOSIS — Z5900 Homelessness unspecified: Secondary | ICD-10-CM | POA: Insufficient documentation

## 2013-11-09 DIAGNOSIS — F411 Generalized anxiety disorder: Secondary | ICD-10-CM | POA: Diagnosis present

## 2013-11-09 DIAGNOSIS — F329 Major depressive disorder, single episode, unspecified: Secondary | ICD-10-CM | POA: Diagnosis present

## 2013-11-09 DIAGNOSIS — F1210 Cannabis abuse, uncomplicated: Secondary | ICD-10-CM | POA: Insufficient documentation

## 2013-11-09 LAB — POC URINE PREG, ED: PREG TEST UR: NEGATIVE

## 2013-11-09 LAB — ETHANOL

## 2013-11-09 LAB — COMPREHENSIVE METABOLIC PANEL
ALT: 13 U/L (ref 0–35)
ANION GAP: 14 (ref 5–15)
AST: 18 U/L (ref 0–37)
Albumin: 3.7 g/dL (ref 3.5–5.2)
Alkaline Phosphatase: 68 U/L (ref 39–117)
BUN: 8 mg/dL (ref 6–23)
CALCIUM: 9.5 mg/dL (ref 8.4–10.5)
CHLORIDE: 100 meq/L (ref 96–112)
CO2: 21 meq/L (ref 19–32)
CREATININE: 0.63 mg/dL (ref 0.50–1.10)
GFR calc non Af Amer: >90 mL/min (ref >90)
Glucose, Bld: 100 mg/dL — ABNORMAL HIGH (ref 70–99)
Potassium: 4.1 mEq/L (ref 3.7–5.3)
Sodium: 135 mEq/L — ABNORMAL LOW (ref 137–147)
Total Protein: 7.6 g/dL (ref 6.0–8.3)

## 2013-11-09 LAB — CBC
HEMATOCRIT: 44 % (ref 36.0–46.0)
Hemoglobin: 14.7 g/dL (ref 12.0–15.0)
MCH: 28.8 pg (ref 26.0–34.0)
MCHC: 33.4 g/dL (ref 30.0–36.0)
MCV: 86.1 fL (ref 78.0–100.0)
Platelets: 295 10*3/uL (ref 150–400)
RBC: 5.11 MIL/uL (ref 3.87–5.11)
RDW: 14.4 % (ref 11.5–15.5)
WBC: 8.5 10*3/uL (ref 4.0–10.5)

## 2013-11-09 LAB — RAPID URINE DRUG SCREEN, HOSP PERFORMED
Amphetamines: NOT DETECTED
Barbiturates: NOT DETECTED
Benzodiazepines: NOT DETECTED
Cocaine: NOT DETECTED
OPIATES: NOT DETECTED
Tetrahydrocannabinol: POSITIVE — AB

## 2013-11-09 MED ORDER — ONDANSETRON HCL 4 MG PO TABS: 4.0000 mg | ORAL_TABLET | Freq: Three times a day (TID) | ORAL | Status: DC | PRN

## 2013-11-09 MED ORDER — ZOLPIDEM TARTRATE 5 MG PO TABS: 5.0000 mg | ORAL_TABLET | Freq: Every evening | ORAL | Status: DC | PRN

## 2013-11-09 MED ORDER — LORAZEPAM 1 MG PO TABS: 1.0000 mg | ORAL_TABLET | Freq: Three times a day (TID) | ORAL | Status: DC | PRN

## 2013-11-09 MED ORDER — NICOTINE 21 MG/24HR TD PT24
21.0000 mg | MEDICATED_PATCH | Freq: Every day | TRANSDERMAL | Status: DC
  Filled 2013-11-09: qty 1

## 2013-11-09 NOTE — ED Notes (Signed)
Pt transferred to rm 30 . Assumed care of pt.  Alert and oriented and denies any needs at this time. Pt reports wanting to sleep. Resting quietly in bed at this time.

## 2013-11-09 NOTE — Consult Note (Signed)
[facility] Face-to-Face Psychiatry Consult   Reason for Consult:  Depression Referring Physician:  EDP  Kimberly Contreras is an 28 y.o. female. Total Time spent with patient: 45 minutes  Assessment: AXIS I:  Depressive Disorder NOS AXIS II:  Deferred AXIS III:   Past Medical History  Diagnosis Date  . Abscess   . Depression   . Obesity   . Anxiety    AXIS IV:  other psychosocial or environmental problems AXIS V:  61-70 mild symptoms  Plan:  No evidence of imminent risk to self or others at present.   Patient does not meet criteria for psychiatric inpatient admission. Supportive therapy provided about ongoing stressors. Discussed crisis plan, support from social network, calling 911, coming to the Emergency Department, and calling Suicide Hotline.  Subjective:   Kimberly Contreras is a 28 y.o. female patient.  HPI:  Patient states that she was depressed yesterday.  "I thought my boyfriend was seeing some other girl.  He said he wasn't; I guess I just had an emotional break and was putting my hand on him (hitting).  His mom and sister told me that he wasn't seeing anyone else and I believed them."  Patient states that she was brought to [facility] because she told him she needed someone to talk to.  Patient states that she does have depression but is seeking outpatient services.  Patient denies suicidal/homicidal ideation, psychosis, and paranoia.   HPI Elements:   Location:  Depression. Quality:  agitated. Severity:  hitting boyfriend. Timing:  1 day.  Review of Systems  Constitutional: Negative for weight loss and malaise/fatigue.  HENT: Negative.   Respiratory: Negative.   Musculoskeletal: Negative.   Psychiatric/Behavioral: Positive for depression. Negative for suicidal ideas, hallucinations and memory loss. Substance abuse: Positive THC. The patient is not nervous/anxious and does not have insomnia.     Family History  Problem Relation Age of Onset  . Cancer Other   . Hypertension Other   .  Diabetes Other    Past Psychiatric History: Past Medical History  Diagnosis Date  . Abscess   . Depression   . Obesity   . Anxiety     reports that she has been smoking Cigarettes.  She has been smoking about 1.00 pack per day. She has never used smokeless tobacco. She reports that she drinks about 1.2 ounces of alcohol per week. She reports that she does not use illicit drugs. Family History  Problem Relation Age of Onset  . Cancer Other   . Hypertension Other   . Diabetes Other            Allergies:  No Known Allergies  ACT Assessment Complete:  Yes:    Educational Status    Risk to Self: Risk to self with the past 6 months Is patient at risk for suicide?: No, but patient needs Medical Clearance Substance abuse history and/or treatment for substance abuse?: No  Risk to Others:    Abuse:    Prior Inpatient Therapy:    Prior Outpatient Therapy:    Additional Information:                    Objective: Blood pressure 109/70, pulse 81, temperature 98.2 F (36.8 C), temperature source Oral, resp. rate 16, weight 135.172 kg (298 lb), last menstrual period 11/07/2013, SpO2 100.00%.Body mass index is 45.32 kg/(m^2). Results for orders placed during the hospital encounter of 11/09/13 (from the past 72 hour(s))  CBC     Status: None  Collection Time    11/09/13  1:47 AM      Result Value Ref Range   WBC 8.5  4.0 - 10.5 K/uL   RBC 5.11  3.87 - 5.11 MIL/uL   Hemoglobin 14.7  12.0 - 15.0 g/dL   HCT 44.0  36.0 - 46.0 %   MCV 86.1  78.0 - 100.0 fL   MCH 28.8  26.0 - 34.0 pg   MCHC 33.4  30.0 - 36.0 g/dL   RDW 14.4  11.5 - 15.5 %   Platelets 295  150 - 400 K/uL  COMPREHENSIVE METABOLIC PANEL     Status: Abnormal   Collection Time    11/09/13  1:47 AM      Result Value Ref Range   Sodium 135 (*) 137 - 147 mEq/L   Potassium 4.1  3.7 - 5.3 mEq/L   Chloride 100  96 - 112 mEq/L   CO2 21  19 - 32 mEq/L   Glucose, Bld 100 (*) 70 - 99 mg/dL   BUN 8  6 - 23 mg/dL    Creatinine, Ser 0.63  0.50 - 1.10 mg/dL   Calcium 9.5  8.4 - 10.5 mg/dL   Total Protein 7.6  6.0 - 8.3 g/dL   Albumin 3.7  3.5 - 5.2 g/dL   AST 18  0 - 37 U/L   ALT 13  0 - 35 U/L   Alkaline Phosphatase 68  39 - 117 U/L   Total Bilirubin <0.2 (*) 0.3 - 1.2 mg/dL   GFR calc non Af Amer >90  >90 mL/min   GFR calc Af Amer >90  >90 mL/min   Comment: (NOTE)     The eGFR has been calculated using the CKD EPI equation.     This calculation has not been validated in all clinical situations.     eGFR's persistently <90 mL/min signify possible Chronic Kidney     Disease.   Anion gap 14  5 - 15  ETHANOL     Status: None   Collection Time    11/09/13  1:47 AM      Result Value Ref Range   Alcohol, Ethyl (B) <11  0 - 11 mg/dL   Comment:            LOWEST DETECTABLE LIMIT FOR     SERUM ALCOHOL IS 11 mg/dL     FOR MEDICAL PURPOSES ONLY  URINE RAPID DRUG SCREEN (HOSP PERFORMED)     Status: Abnormal   Collection Time    11/09/13  6:41 AM      Result Value Ref Range   Opiates NONE DETECTED  NONE DETECTED   Cocaine NONE DETECTED  NONE DETECTED   Benzodiazepines NONE DETECTED  NONE DETECTED   Amphetamines NONE DETECTED  NONE DETECTED   Tetrahydrocannabinol POSITIVE (*) NONE DETECTED   Barbiturates NONE DETECTED  NONE DETECTED   Comment:            DRUG SCREEN FOR MEDICAL PURPOSES     ONLY.  IF CONFIRMATION IS NEEDED     FOR ANY PURPOSE, NOTIFY LAB     WITHIN 5 DAYS.                LOWEST DETECTABLE LIMITS     FOR URINE DRUG SCREEN     Drug Class       Cutoff (ng/mL)     Amphetamine      1000     Barbiturate  200     Benzodiazepine   256     Tricyclics       389     Opiates          300     Cocaine          300     THC              50  POC URINE PREG, ED     Status: None   Collection Time    11/09/13  6:45 AM      Result Value Ref Range   Preg Test, Ur NEGATIVE  NEGATIVE   Comment:            THE SENSITIVITY OF THIS     METHODOLOGY IS >24 mIU/mL   Labs are reviewed see  above values.  Medications reviewed and no changes made  Current Facility-Administered Medications  Medication Dose Route Frequency Provider Last Rate Last Dose  . LORazepam (ATIVAN) tablet 1 mg  1 mg Oral Q8H PRN Ruthell Rummage Dammen, PA-C      . nicotine (NICODERM CQ - dosed in mg/24 hours) patch 21 mg  21 mg Transdermal Daily Peter S Dammen, PA-C      . ondansetron (ZOFRAN) tablet 4 mg  4 mg Oral Q8H PRN Ruthell Rummage Dammen, PA-C      . zolpidem (AMBIEN) tablet 5 mg  5 mg Oral QHS PRN Martie Lee, PA-C       No current outpatient prescriptions on file.    Psychiatric Specialty Exam:     Blood pressure 109/70, pulse 81, temperature 98.2 F (36.8 C), temperature source Oral, resp. rate 16, weight 135.172 kg (298 lb), last menstrual period 11/07/2013, SpO2 100.00%.Body mass index is 45.32 kg/(m^2).  General Appearance: Casual  Eye Contact::  Good  Speech:  Clear and Coherent  Volume:  Normal  Mood:  Depressed  Affect:  Congruent  Thought Process:  Circumstantial and Goal Directed  Orientation:  Full (Time, Place, and Person)  Thought Content:  "I just want some to talk to"  Suicidal Thoughts:  No  Homicidal Thoughts:  No  Memory:  Immediate;   Good Recent;   Good Remote;   Good  Judgement:  Fair  Insight:  Fair  Psychomotor Activity:  Normal  Concentration:  Fair  Recall:  Good  Fund of Knowledge:Good  Language: Good  Akathisia:  No  Handed:  Right  AIMS (if indicated):     Assets:  Communication Skills Desire for Improvement Housing Social Support  Sleep:      Musculoskeletal: Strength & Muscle Tone: within normal limits Gait & Station: normal Patient leans: N/A  Treatment Plan Summary: Discharge home with resources for outpatient services.  Patient to follow up with Nicholaus Bloom, Adriel Desrosier, FNP-BC 11/09/2013 11:12 AM

## 2013-11-09 NOTE — Discharge Instructions (Signed)
Depression °Depression refers to feeling sad, low, down in the dumps, blue, gloomy, or empty. In general, there are two kinds of depression: °1. Normal sadness or normal grief. This kind of depression is one that we all feel from time to time after upsetting life experiences, such as the loss of a job or the ending of a relationship. This kind of depression is considered normal, is short lived, and resolves within a few days to 2 weeks. Depression experienced after the loss of a loved one (bereavement) often lasts longer than 2 weeks but normally gets better with time. °2. Clinical depression. This kind of depression lasts longer than normal sadness or normal grief or interferes with your ability to function at home, at work, and in school. It also interferes with your personal relationships. It affects almost every aspect of your life. Clinical depression is an illness. °Symptoms of depression can also be caused by conditions other than those mentioned above, such as: °· Physical illness. Some physical illnesses, including underactive thyroid gland (hypothyroidism), severe anemia, specific types of cancer, diabetes, uncontrolled seizures, heart and lung problems, strokes, and chronic pain are commonly associated with symptoms of depression. °· Side effects of some prescription medicine. In some people, certain types of medicine can cause symptoms of depression. °· Substance abuse. Abuse of alcohol and illicit drugs can cause symptoms of depression. °SYMPTOMS °Symptoms of normal sadness and normal grief include the following: °· Feeling sad or crying for short periods of time. °· Not caring about anything (apathy). °· Difficulty sleeping or sleeping too much. °· No longer able to enjoy the things you used to enjoy. °· Desire to be by oneself all the time (social isolation). °· Lack of energy or motivation. °· Difficulty concentrating or remembering. °· Change in appetite or weight. °· Restlessness or  agitation. °Symptoms of clinical depression include the same symptoms of normal sadness or normal grief and also the following symptoms: °· Feeling sad or crying all the time. °· Feelings of guilt or worthlessness. °· Feelings of hopelessness or helplessness. °· Thoughts of suicide or the desire to harm yourself (suicidal ideation). °· Loss of touch with reality (psychotic symptoms). Seeing or hearing things that are not real (hallucinations) or having false beliefs about your life or the people around you (delusions and paranoia). °DIAGNOSIS  °The diagnosis of clinical depression is usually based on how bad the symptoms are and how long they have lasted. Your health care provider will also ask you questions about your medical history and substance use to find out if physical illness, use of prescription medicine, or substance abuse is causing your depression. Your health care provider may also order blood tests. °TREATMENT  °Often, normal sadness and normal grief do not require treatment. However, sometimes antidepressant medicine is given for bereavement to ease the depressive symptoms until they resolve. °The treatment for clinical depression depends on how bad the symptoms are but often includes antidepressant medicine, counseling with a mental health professional, or both. Your health care provider will help to determine what treatment is best for you. °Depression caused by physical illness usually goes away with appropriate medical treatment of the illness. If prescription medicine is causing depression, talk with your health care provider about stopping the medicine, decreasing the dose, or changing to another medicine. °Depression caused by the abuse of alcohol or illicit drugs goes away when you stop using these substances. Some adults need professional help in order to stop drinking or using drugs. °SEEK IMMEDIATE MEDICAL   CARE IF:  You have thoughts about hurting yourself or others.  You lose touch  with reality (have psychotic symptoms).  You are taking medicine for depression and have a serious side effect. FOR MORE INFORMATION  [employer] Mental [employer]ness: www.nami.[employer]: https://carter.com/ Document Released: 03/01/2000 Document Revised: 07/19/2013 Document Reviewed: 06/03/2011 [facility] Patient Information 2015 [place], [place]. This information is not intended to replace advice given to you by your health care provider. Make sure you discuss any questions you have with your health care provider.  Stress Stress-related medical problems are becoming increasingly common. The body has a built-in physical response to stressful situations. Faced with pressure, challenge or danger, we need to react quickly. Our bodies release hormones such as cortisol and adrenaline to help do this. These hormones are part of the "fight or flight" response and affect the metabolic rate, heart rate and blood pressure, resulting in a heightened, stressed state that prepares the body for optimum performance in dealing with a stressful situation. It is likely that early man required these mechanisms to stay alive, but usually modern stresses do not call for this, and the same hormones released in today's world can damage health and reduce coping ability. CAUSES  Pressure to perform at work, at school or in sports.  Threats of physical violence.  Money worries.  Arguments.  Family conflicts.  Divorce or separation from significant other.  Bereavement.  New job or unemployment.  Changes in location.  Alcohol or drug abuse. SOMETIMES, THERE IS NO PARTICULAR REASON FOR DEVELOPING STRESS. Almost all people are at risk of being stressed at some time in their lives. It is important to know that some stress is temporary and some is long term.  Temporary stress will go away when a situation is resolved. Most people can cope with short periods of stress, and it can  often be relieved by relaxing, taking a walk or getting any type of exercise, chatting through issues with friends, or having a good night's sleep.  Chronic (long-term, continuous) stress is much harder to deal with. It can be psychologically and emotionally damaging. It can be harmful both for an individual and for friends and family. SYMPTOMS Everyone reacts to stress differently. There are some common effects that help [place] recognize it. In times of extreme stress, people may:  Shake uncontrollably.  Breathe faster and deeper than normal (hyperventilate).  Vomit.  For people with asthma, stress can trigger an attack.  For some people, stress may trigger migraine headaches, ulcers, and body pain. PHYSICAL EFFECTS OF STRESS MAY INCLUDE:  Loss of energy.  Skin problems.  Aches and pains resulting from tense muscles, including neck ache, backache and tension headaches.  Increased pain from arthritis and other conditions.  Irregular heart beat (palpitations).  Periods of irritability or anger.  Apathy or depression.  Anxiety (feeling uptight or worrying).  Unusual behavior.  Loss of appetite.  Comfort eating.  Lack of concentration.  Loss of, or decreased, sex-drive.  Increased smoking, drinking, or recreational drug use.  For women, missed periods.  Ulcers, joint pain, and muscle pain. Post-traumatic stress is the stress caused by any serious accident, strong emotional damage, or extremely difficult or violent experience such as rape or war. Post-traumatic stress victims can experience mixtures of emotions such as fear, shame, depression, guilt or anger. It may include recurrent memories or images that may be haunting. These feelings can last for weeks, months or even years after the traumatic  event that triggered them. Specialized treatment, possibly with medicines and psychological therapies, is available. If stress is causing physical symptoms, severe distress or  making it difficult for you to function as normal, it is worth seeing your caregiver. It is important to remember that although stress is a usual part of life, extreme or prolonged stress can lead to other illnesses that will need treatment. It is better to visit a doctor sooner rather than later. Stress has been linked to the development of high blood pressure and heart disease, as well as insomnia and depression. There is no diagnostic test for stress since everyone reacts to it differently. But a caregiver will be able to spot the physical symptoms, such as:  Headaches.  Shingles.  Ulcers. Emotional distress such as intense worry, low mood or irritability should be detected when the doctor asks pertinent questions to identify any underlying problems that might be the cause. In case there are physical reasons for the symptoms, the doctor may also want to do some tests to exclude certain conditions. If you feel that you are suffering from stress, try to identify the aspects of your life that are causing it. Sometimes you may not be able to change or avoid them, but even a small change can have a positive ripple effect. A simple lifestyle change can make all the difference. STRATEGIES THAT CAN HELP DEAL WITH STRESS:  Delegating or sharing responsibilities.  Avoiding confrontations.  Learning to be more assertive.  Regular exercise.  Avoid using alcohol or street drugs to cope.  Eating a healthy, balanced diet, rich in fruit and vegetables and proteins.  Finding humor or absurdity in stressful situations.  Never taking on more than you know you can handle comfortably.  Organizing your time better to get as much done as possible.  Talking to friends or family and sharing your thoughts and fears.  Listening to music or relaxation tapes.  Relaxation techniques like deep breathing, meditation, and yoga.  Tensing and then relaxing your muscles, starting at the toes and working up to the  head and neck. If you think that you would benefit from help, either in identifying the things that are causing your stress or in learning techniques to help you relax, see a caregiver who is capable of helping you with this. Rather than relying on medications, it is usually better to try and identify the things in your life that are causing stress and try to deal with them. There are many techniques of managing stress including counseling, psychotherapy, aromatherapy, yoga, and exercise. Your caregiver can help you determine what is best for you. Document Released: 05/25/2002 Document Revised: 03/09/2013 Document Reviewed: 04/21/2007 [facility] Patient Information 2015 [place], [place]. This information is not intended to replace advice given to you by your health care provider. Make sure you discuss any questions you have with your health care provider.  Major Depressive Disorder Major depressive disorder is a mental illness. It also may be called clinical depression or unipolar depression. Major depressive disorder usually causes feelings of sadness, hopelessness, or helplessness. Some people with this disorder do not feel particularly sad but lose interest in doing things they used to enjoy (anhedonia). Major depressive disorder also can cause physical symptoms. It can interfere with work, school, relationships, and other normal everyday activities. The disorder varies in severity but is longer lasting and more serious than the sadness we all feel from time to time in our lives. Major depressive disorder often is triggered by stressful life  events or major life changes. Examples of these triggers include divorce, loss of your job or home, a move, and the death of a family member or close friend. Sometimes this disorder occurs for no obvious reason at all. People who have family members with major depressive disorder or bipolar disorder are at higher risk for developing this disorder, with or without life  stressors. Major depressive disorder can occur at any age. It may occur just once in your life (single episode major depressive disorder). It may occur multiple times (recurrent major depressive disorder). SYMPTOMS People with major depressive disorder have either anhedonia or depressed mood on nearly a daily basis for at least 2 weeks or longer. Symptoms of depressed mood include:  Feelings of sadness (blue or down in the dumps) or emptiness.  Feelings of hopelessness or helplessness.  Tearfulness or episodes of crying (may be observed by others).  Irritability (children and adolescents). In addition to depressed mood or anhedonia or both, people with this disorder have at least four of the following symptoms:  Difficulty sleeping or sleeping too much.   Significant change (increase or decrease) in appetite or weight.   Lack of energy or motivation.  Feelings of guilt and worthlessness.   Difficulty concentrating, remembering, or making decisions.  Unusually slow movement (psychomotor retardation) or restlessness (as observed by others).   Recurrent wishes for death, recurrent thoughts of self-harm (suicide), or a suicide attempt. People with major depressive disorder commonly have persistent negative thoughts about themselves, other people, and the world. People with severe major depressive disorder may experiencedistorted beliefs or perceptions about the world (psychotic delusions). They also may see or hear things that are not real (psychotic hallucinations). DIAGNOSIS Major depressive disorder is diagnosed through an assessment by your health care provider. Your health care provider will ask aboutaspects of your daily life, such as mood,sleep, and appetite, to see if you have the diagnostic symptoms of major depressive disorder. Your health care provider may ask about your medical history and use of alcohol or drugs, including prescription medicines. Your health care provider  also may do a physical exam and blood work. This is because certain medical conditions and the use of certain substances can cause major depressive disorder-like symptoms (secondary depression). Your health care provider also may refer you to a mental health specialist for further evaluation and treatment. TREATMENT It is important to recognize the symptoms of major depressive disorder and seek treatment. The following treatments can be prescribed for this disorder:   Medicine. Antidepressant medicines usually are prescribed. Antidepressant medicines are thought to correct chemical imbalances in the brain that are commonly associated with major depressive disorder. Other types of medicine may be added if the symptoms do not respond to antidepressant medicines alone or if psychotic delusions or hallucinations occur.  Talk therapy. Talk therapy can be helpful in treating major depressive disorder by providing support, education, and guidance. Certain types of talk therapy also can help with negative thinking (cognitive behavioral therapy) and with relationship issues that trigger this disorder (interpersonal therapy). A mental health specialist can help determine which treatment is best for you. Most people with major depressive disorder do well with a combination of medicine and talk therapy. Treatments involving electrical stimulation of the brain can be used in situations with extremely severe symptoms or when medicine and talk therapy do not work over time. These treatments include electroconvulsive therapy, transcranial magnetic stimulation, and vagal nerve stimulation. Document Released: 06/29/2012 Document Revised: 07/19/2013 Document Reviewed: 06/29/2012 [employer]  Patient Information ©2015 [employer]. This information is not intended to replace advice given to you by your health care provider. Make sure you discuss any questions you have with your health care provider. ° °

## 2013-11-09 NOTE — [facility] Suicide Risk Assessment (Cosign Needed)
Suicide Risk Assessment  Discharge Assessment     Demographic Factors:  Female  Total Time spent with patient: 30 minutes  Psychiatric Specialty Exam:     Blood pressure 109/70, pulse 81, temperature 98.2 F (36.8 C), temperature source Oral, resp. rate 16, weight 135.172 kg (298 lb), last menstrual period 11/07/2013, SpO2 100.00%.Body mass index is 45.32 kg/(m^2).   General Appearance: Casual   Eye Contact:: Good   Speech: Clear and Coherent   Volume: Normal   Mood: Depressed   Affect: Congruent   Thought Process: Circumstantial and Goal Directed   Orientation: Full (Time, Place, and Person)   Thought Content: "I just want some to talk to"   Suicidal Thoughts: No   Homicidal Thoughts: No   Memory: Immediate; Good  Recent; Good  Remote; Good   Judgement: Fair   Insight: Fair   Psychomotor Activity: Normal   Concentration: Fair   Recall: Good   Fund of Knowledge:Good   Language: Good   Akathisia: No   Handed: Right   AIMS (if indicated):   Assets: Communication Skills  Desire for Improvement  Housing  Social Support   Sleep:   Musculoskeletal:  Strength & Muscle Tone: within normal limits  Gait & Station: normal  Patient leans: N/A    Mental Status Per Nursing Assessment::   On Admission:     Current Mental Status by Physician: Patient denies suicidal ideation, homicidal  ideation, psychosis, and paranoia  Loss Factors: NA  Historical Factors: NA  Risk Reduction Factors:   Sense of responsibility to family and Positive social support  Continued Clinical Symptoms:  Previous Psychiatric Diagnoses and Treatments  Cognitive Features That Contribute To Risk:  None noted    Suicide Risk:  Minimal: No identifiable suicidal ideation.  Patients presenting with no risk factors but with morbid ruminations; may be classified as minimal risk based on the severity of the depressive symptoms  Discharge Diagnoses:  AXIS I: Depressive Disorder NOS  AXIS II:  Deferred  AXIS III:  Past Medical History   Diagnosis  Date   .  Abscess    .  Depression    .  Obesity    .  Anxiety     AXIS IV: other psychosocial or environmental problems  AXIS V: 61-70 mild symptoms     Plan Of Care/Follow-up recommendations:  Activity:  as tolerated Diet:  as tolerated Other:  [facility]  Is patient on multiple antipsychotic therapies at discharge:  No   Has Patient had three or more failed trials of antipsychotic monotherapy by history:  No  Recommended Plan for Multiple Antipsychotic Therapies: NA    Brendan Gruwell, FNP-BC 11/09/2013, 11:26 AM

## 2013-11-09 NOTE — ED Provider Notes (Signed)
CSN: [id number]     Arrival date & time 11/09/13  0039 History   First MD Initiated Contact with Patient 11/09/13 0204     Chief Complaint  Patient presents with  . Medical Clearance   HPI  History provided by the patient. Patient is a 28 year old homeless female with past history of anxiety and depression presenting with worsening depression and suicidal thoughts. Patient states she has had heightened emotions recently with frequent mood swings including worsening depression. She has had some suicidal thoughts without any specific plan. She was brought in with her ex-boyfriend for concerns of harm to herself. She currently denies any SI or plan. Does wish to have some help with her worsening depression. She is not taking medications. Denies any other complaints.    Past Medical History  Diagnosis Date  . Abscess   . Depression   . Obesity   . Anxiety    Past Surgical History  Procedure Laterality Date  . Appendectomy     Family History  Problem Relation Age of Onset  . Cancer Other   . Hypertension Other   . Diabetes Other    History  Substance Use Topics  . Smoking status: Current Every Day Smoker -- 1.00 packs/day    Types: Cigarettes  . Smokeless tobacco: Never Used  . Alcohol Use: 1.2 oz/week    2 Shots of liquor per week     Comment: on weekends   OB History   Grav Para Term Preterm Abortions TAB SAB Ect Mult Living                 Review of Systems  All other systems reviewed and are negative.     Allergies  Review of patient's allergies indicates no known allergies.  Home Medications   Prior to Admission medications   Not on File   BP 138/81  Pulse 61  Temp(Src) 97.9 F (36.6 C) (Oral)  Resp 18  Wt 298 lb (135.172 kg)  SpO2 100%  LMP 11/07/2013 Physical Exam  Nursing note and vitals reviewed. Constitutional: She is oriented to person, place, and time. She appears well-developed and well-nourished. No distress.  HENT:  Head: Normocephalic.   Cardiovascular: Normal rate and regular rhythm.   Pulmonary/Chest: Effort normal and breath sounds normal.  Abdominal: Soft.  Neurological: She is alert and oriented to person, place, and time.  Skin: Skin is warm and dry. No rash noted.  Psychiatric: Her behavior is normal. She exhibits a depressed mood. She expresses no suicidal plans.    ED Course  Procedures   COORDINATION OF CARE:  Nursing notes reviewed. Vital signs reviewed. Initial pt interview and examination performed.   Filed Vitals:   11/09/13 0122  BP: 138/81  Pulse: 61  Temp: 97.9 F (36.6 C)  TempSrc: Oral  Resp: 18  Weight: 298 lb (135.172 kg)  SpO2: 100%    2:38 AM-  Patient seen and evaluated. Well-appearing no acute distress. Currently denies SI.  TTS consult placed. Psychiatric holding orders placed.  Patient medically cleared for further psychiatric evaluation  Treatment plan initiated:Medications - No data to display   Results for orders placed during the hospital encounter of 11/09/13  CBC      Result Value Ref Range   WBC 8.5  4.0 - 10.5 K/uL   RBC 5.11  3.87 - 5.11 MIL/uL   Hemoglobin 14.7  12.0 - 15.0 g/dL   HCT 40.9  81.1 - 91.4 %   MCV 86.1  78.0 -  100.0 fL   MCH 28.8  26.0 - 34.0 pg   MCHC 33.4  30.0 - 36.0 g/dL   RDW 40.9  81.1 - 91.4 %   Platelets 295  150 - 400 K/uL  COMPREHENSIVE METABOLIC PANEL      Result Value Ref Range   Sodium 135 (*) 137 - 147 mEq/L   Potassium 4.1  3.7 - 5.3 mEq/L   Chloride 100  96 - 112 mEq/L   CO2 21  19 - 32 mEq/L   Glucose, Bld 100 (*) 70 - 99 mg/dL   BUN 8  6 - 23 mg/dL   Creatinine, Ser 7.82  0.50 - 1.10 mg/dL   Calcium 9.5  8.4 - 95.6 mg/dL   Total Protein 7.6  6.0 - 8.3 g/dL   Albumin 3.7  3.5 - 5.2 g/dL   AST 18  0 - 37 U/L   ALT 13  0 - 35 U/L   Alkaline Phosphatase 68  39 - 117 U/L   Total Bilirubin <0.2 (*) 0.3 - 1.2 mg/dL   GFR calc non Af Amer >90  >90 mL/min   GFR calc Af Amer >90  >90 mL/min   Anion gap 14  5 - 15  ETHANOL       Result Value Ref Range   Alcohol, Ethyl (B) <11  0 - 11 mg/dL      MDM   Final diagnoses:  Depression  Homeless        Angus Seller, PA-C 11/09/13 0600

## 2013-11-09 NOTE — ED Notes (Signed)
1 pt belonging bag in locker #30 

## 2013-11-09 NOTE — ED Notes (Signed)
Pt states she is feeling like she is going to have an emotional breakdown and her depression is getting worse  Pt states sometimes she can be laughing and then just start crying  Pt states she is having a difficult time focusing on things  Pt states she needs something to help even her out

## 2013-11-09 NOTE — Consult Note (Signed)
Face to face evaluation and I agree with this note 

## 2013-11-09 NOTE — ED Notes (Signed)
Pt discharged to home. DC instructions given. No concerns voiced . Pt requested a bus pass. Bus pass given to pt. Left unit in good condition ambulating to checkout accompanied by nurse tech. No concerns voiced. Vw, rn.

## 2013-11-11 NOTE — ED Provider Notes (Signed)
Medical screening examination/treatment/procedure(s) were performed by non-physician practitioner and as supervising physician I was immediately available for consultation/collaboration.   EKG Interpretation None        Enid Skeens, MD 11/11/13 [phone]

## 2013-11-12 ENCOUNTER — Encounter (HOSPITAL_COMMUNITY): Payer: Self-pay | Admitting: Emergency Medicine

## 2013-11-12 ENCOUNTER — Emergency Department (HOSPITAL_COMMUNITY)
Admission: EM | Admit: 2013-11-12 | Discharge: 2013-11-13 | Disposition: A | Discharge status: Home / Self Care | Payer: Self-pay | Attending: Emergency Medicine | Admitting: Emergency Medicine

## 2013-11-12 DIAGNOSIS — Z8659 Personal history of other mental and behavioral disorders: Secondary | ICD-10-CM | POA: Insufficient documentation

## 2013-11-12 DIAGNOSIS — E669 Obesity, unspecified: Secondary | ICD-10-CM | POA: Insufficient documentation

## 2013-11-12 DIAGNOSIS — R053 Chronic cough: Secondary | ICD-10-CM

## 2013-11-12 DIAGNOSIS — R059 Cough, unspecified: Secondary | ICD-10-CM | POA: Insufficient documentation

## 2013-11-12 DIAGNOSIS — Z872 Personal history of diseases of the skin and subcutaneous tissue: Secondary | ICD-10-CM | POA: Insufficient documentation

## 2013-11-12 DIAGNOSIS — R05 Cough: Secondary | ICD-10-CM | POA: Insufficient documentation

## 2013-11-12 DIAGNOSIS — F17200 Nicotine dependence, unspecified, uncomplicated: Secondary | ICD-10-CM | POA: Insufficient documentation

## 2013-11-12 NOTE — ED Provider Notes (Signed)
CSN: [id number]     Arrival date & time 11/12/13  2324 History  This chart was scribed for non-physician practitioner working with Benjiman Core, MD by Elveria Rising, ED Scribe. This patient was seen in room TR05C/TR05C and the patient's care was started at 11:51 PM.   Chief Complaint  Patient presents with  . Cough    The history is provided by the patient. No language interpreter was used.   HPI Comments: Kimberly Contreras is a 28 y.o. female who presents to the Emergency Department complaining of productive cough and sore throat for one year. Patient reports having thick mucous in her throat and having pain when swallowing. Patient describes both brownish and greenish mucous. Patient also reports intermittent nausea; she denies association with her anxiety.  Patient is admitted smoker; pack per day.   Past Medical History  Diagnosis Date  . Abscess   . Depression   . Obesity   . Anxiety    Past Surgical History  Procedure Laterality Date  . Appendectomy     Family History  Problem Relation Age of Onset  . Cancer Other   . Hypertension Other   . Diabetes Other    History  Substance Use Topics  . Smoking status: Current Every Day Smoker -- 1.00 packs/day    Types: Cigarettes  . Smokeless tobacco: Never Used  . Alcohol Use: 1.2 oz/week    2 Shots of liquor per week     Comment: on weekends   OB History   Grav Para Term Preterm Abortions TAB SAB Ect Mult Living                 Review of Systems  Constitutional: Negative for fever and chills.  HENT: Positive for sore throat.   Respiratory: Positive for cough.   Gastrointestinal: Positive for nausea. Negative for vomiting and diarrhea.  All other systems reviewed and are negative.  Allergies  Review of patient's allergies indicates no known allergies.  Home Medications   Prior to Admission medications   Not on File   Triage Vitals: BP 148/83  Pulse 74  Temp(Src) 98.1 F (36.7 C) (Oral)  Resp 18  Ht   (1.727 m)  Wt 288 lb (130.636 kg)  BMI 43.80 kg/m2  SpO2 100%  LMP 11/07/2013  Physical Exam  Nursing note and vitals reviewed. Constitutional: She is oriented to person, place, and time. She appears well-developed and well-nourished.  HENT:  Head: Normocephalic and atraumatic.  No erythema to the throat.   Eyes: EOM are normal.  Neck: Neck supple.  Cardiovascular: Normal rate and regular rhythm.   Pulmonary/Chest: Effort normal and breath sounds normal. No respiratory distress. She has no wheezes. She has no rales.  Musculoskeletal: Normal range of motion.  Lymphadenopathy:    She has no cervical adenopathy.  Neurological: She is alert and oriented to person, place, and time.  Skin: Skin is warm and dry.  Psychiatric: She has a normal mood and affect. Her behavior is normal.    ED Course  Procedures (including critical care time)  COORDINATION OF CARE: 11:54 PM- Will order CXR. Discussed treatment plan with patient at bedside and patient agreed to plan.   Labs Review Labs Reviewed - No data to display  Imaging Review No results found.   EKG Interpretation None     CXR results reviewed and shared with patient.  No current indication of infectious process.  Smoking history.  Will provide albuterol MDI for symptom control.  MDM  Final diagnoses:  None    Cough.  I personally performed the services described in this documentation, which was scribed in my presence. The recorded information has been reviewed and is accurate.    Jimmye Norman, NP 11/13/13 [phone]

## 2013-11-12 NOTE — ED Notes (Signed)
Pt. reports productive cough / sore throat for 1 month with occasional nausea , denies fever or chills.

## 2013-11-13 ENCOUNTER — Emergency Department (HOSPITAL_COMMUNITY): Payer: Self-pay

## 2013-11-13 MED ORDER — ALBUTEROL SULFATE HFA 108 (90 BASE) MCG/ACT IN AERS
INHALATION_SPRAY | RESPIRATORY_TRACT | Status: AC
  Filled 2013-11-13: qty 6.7

## 2013-11-13 MED ORDER — ALBUTEROL SULFATE HFA 108 (90 BASE) MCG/ACT IN AERS
2.0000 | INHALATION_SPRAY | Freq: Four times a day (QID) | RESPIRATORY_TRACT | Status: DC | PRN
  Administered 2013-11-13: 2 via RESPIRATORY_TRACT

## 2013-11-13 NOTE — ED Notes (Signed)
Pt back from x-ray.

## 2013-11-13 NOTE — Discharge Instructions (Signed)
Cough, Adult  A cough is a reflex that helps clear your throat and airways. It can help heal the body or may be a reaction to an irritated airway. A cough may only last 2 or 3 weeks (acute) or may last more than 8 weeks (chronic).  CAUSES Acute cough:  Viral or bacterial infections. Chronic cough:  Infections.  Allergies.  Asthma.  Post-nasal drip.  Smoking.  Heartburn or acid reflux.  Some medicines.  Chronic lung problems (COPD).  Cancer. SYMPTOMS   Cough.  Fever.  Chest pain.  Increased breathing rate.  High-pitched whistling sound when breathing (wheezing).  Colored mucus that you cough up (sputum). TREATMENT   A bacterial cough may be treated with antibiotic medicine.  A viral cough must run its course and will not respond to antibiotics.  Your caregiver may recommend other treatments if you have a chronic cough. HOME CARE INSTRUCTIONS   Only take over-the-counter or prescription medicines for pain, discomfort, or fever as directed by your caregiver. Use cough suppressants only as directed by your caregiver.  Use a cold steam vaporizer or humidifier in your bedroom or home to help loosen secretions.  Sleep in a semi-upright position if your cough is worse at night.  Rest as needed.  Stop smoking if you smoke. SEEK IMMEDIATE MEDICAL CARE IF:   You have pus in your sputum.  Your cough starts to worsen.  You cannot control your cough with suppressants and are losing sleep.  You begin coughing up blood.  You have difficulty breathing.  You develop pain which is getting worse or is uncontrolled with medicine.  You have a fever. MAKE SURE YOU:   Understand these instructions.  Will watch your condition.  Will get help right away if you are not doing well or get worse. Document Released: 08/31/2010 Document Revised: 05/27/2011 Document Reviewed: 08/31/2010 ExitCare Patient Information 2015 [employer]. This information is not intended  to replace advice given to you by your health care provider. Make sure you discuss any questions you have with your health care provider.  

## 2013-11-13 NOTE — ED Notes (Signed)
Pt to xray

## 2013-11-13 NOTE — ED Provider Notes (Signed)
Medical screening examination/treatment/procedure(s) were performed by non-physician practitioner and as supervising physician I was immediately available for consultation/collaboration.   EKG Interpretation None       Pinchus Weckwerth R. Graycee Greeson, MD 11/13/13 0722 

## 2013-11-14 ENCOUNTER — Emergency Department (HOSPITAL_COMMUNITY)
Admission: EM | Admit: 2013-11-14 | Discharge: 2013-11-14 | Disposition: A | Discharge status: Home / Self Care | Payer: Self-pay | Attending: Emergency Medicine | Admitting: Emergency Medicine

## 2013-11-14 ENCOUNTER — Encounter (HOSPITAL_COMMUNITY): Payer: Self-pay | Admitting: Emergency Medicine

## 2013-11-14 DIAGNOSIS — F419 Anxiety disorder, unspecified: Secondary | ICD-10-CM

## 2013-11-14 DIAGNOSIS — Z3202 Encounter for pregnancy test, result negative: Secondary | ICD-10-CM | POA: Insufficient documentation

## 2013-11-14 DIAGNOSIS — F411 Generalized anxiety disorder: Secondary | ICD-10-CM | POA: Insufficient documentation

## 2013-11-14 DIAGNOSIS — F32A Depression, unspecified: Secondary | ICD-10-CM

## 2013-11-14 DIAGNOSIS — F329 Major depressive disorder, single episode, unspecified: Secondary | ICD-10-CM | POA: Insufficient documentation

## 2013-11-14 DIAGNOSIS — F17200 Nicotine dependence, unspecified, uncomplicated: Secondary | ICD-10-CM | POA: Insufficient documentation

## 2013-11-14 DIAGNOSIS — E669 Obesity, unspecified: Secondary | ICD-10-CM | POA: Insufficient documentation

## 2013-11-14 DIAGNOSIS — F3289 Other specified depressive episodes: Secondary | ICD-10-CM | POA: Insufficient documentation

## 2013-11-14 DIAGNOSIS — Z872 Personal history of diseases of the skin and subcutaneous tissue: Secondary | ICD-10-CM | POA: Insufficient documentation

## 2013-11-14 LAB — BASIC METABOLIC PANEL
ANION GAP: 14 (ref 5–15)
BUN: 8 mg/dL (ref 6–23)
CHLORIDE: 102 meq/L (ref 96–112)
CO2: 24 meq/L (ref 19–32)
CREATININE: 0.64 mg/dL (ref 0.50–1.10)
Calcium: 9.1 mg/dL (ref 8.4–10.5)
GFR calc Af Amer: >90 mL/min (ref >90)
GFR calc non Af Amer: >90 mL/min (ref >90)
GLUCOSE: 80 mg/dL (ref 70–99)
Potassium: 4 mEq/L (ref 3.7–5.3)
Sodium: 140 mEq/L (ref 137–147)

## 2013-11-14 LAB — CBC WITH DIFFERENTIAL/PLATELET
BASOS ABS: 0 10*3/uL (ref 0.0–0.1)
Basophils Relative: 1 % (ref 0–1)
Eosinophils Absolute: 0.2 10*3/uL (ref 0.0–0.7)
Eosinophils Relative: 3 % (ref 0–5)
HEMATOCRIT: 41.6 % (ref 36.0–46.0)
HEMOGLOBIN: 13.8 g/dL (ref 12.0–15.0)
LYMPHS ABS: 1.9 10*3/uL (ref 0.7–4.0)
LYMPHS PCT: 28 % (ref 12–46)
MCH: 28.8 pg (ref 26.0–34.0)
MCHC: 33.2 g/dL (ref 30.0–36.0)
MCV: 86.8 fL (ref 78.0–100.0)
MONO ABS: 0.4 10*3/uL (ref 0.1–1.0)
Monocytes Relative: 6 % (ref 3–12)
NEUTROS ABS: 4.1 10*3/uL (ref 1.7–7.7)
Neutrophils Relative %: 62 % (ref 43–77)
Platelets: 278 10*3/uL (ref 150–400)
RBC: 4.79 MIL/uL (ref 3.87–5.11)
RDW: 14.3 % (ref 11.5–15.5)
WBC: 6.6 10*3/uL (ref 4.0–10.5)

## 2013-11-14 LAB — RAPID URINE DRUG SCREEN, HOSP PERFORMED
Amphetamines: NOT DETECTED
BARBITURATES: NOT DETECTED
Benzodiazepines: NOT DETECTED
Cocaine: NOT DETECTED
OPIATES: NOT DETECTED
TETRAHYDROCANNABINOL: POSITIVE — AB

## 2013-11-14 LAB — POC URINE PREG, ED: Preg Test, Ur: NEGATIVE

## 2013-11-14 LAB — ACETAMINOPHEN LEVEL: Acetaminophen (Tylenol), Serum: <15 ug/mL (ref 10–30)

## 2013-11-14 LAB — ETHANOL: Alcohol, Ethyl (B): <11 mg/dL (ref 0–11)

## 2013-11-14 LAB — SALICYLATE LEVEL

## 2013-11-14 MED ORDER — ALUM & MAG HYDROXIDE-SIMETH 200-200-20 MG/5ML PO SUSP: 30.0000 mL | ORAL | Status: DC | PRN

## 2013-11-14 MED ORDER — ZOLPIDEM TARTRATE 5 MG PO TABS: 5.0000 mg | ORAL_TABLET | Freq: Every evening | ORAL | Status: DC | PRN

## 2013-11-14 MED ORDER — ACETAMINOPHEN 325 MG PO TABS: 650.0000 mg | ORAL_TABLET | ORAL | Status: DC | PRN

## 2013-11-14 MED ORDER — LORAZEPAM 1 MG PO TABS: 1.0000 mg | ORAL_TABLET | Freq: Three times a day (TID) | ORAL | Status: DC | PRN

## 2013-11-14 MED ORDER — ONDANSETRON HCL 4 MG PO TABS: 4.0000 mg | ORAL_TABLET | Freq: Three times a day (TID) | ORAL | Status: DC | PRN

## 2013-11-14 MED ORDER — NICOTINE 21 MG/24HR TD PT24: 21.0000 mg | MEDICATED_PATCH | Freq: Every day | TRANSDERMAL | Status: DC

## 2013-11-14 MED ORDER — IBUPROFEN 400 MG PO TABS: 600.0000 mg | ORAL_TABLET | Freq: Three times a day (TID) | ORAL | Status: DC | PRN

## 2013-11-14 NOTE — Discharge Instructions (Signed)
Please take your medications as prescribed and follow up at [facility].   Depression Depression refers to feeling sad, low, down in the dumps, blue, gloomy, or empty. In general, there are two kinds of depression: 1. Normal sadness or normal grief. This kind of depression is one that we all feel from time to time after upsetting life experiences, such as the loss of a job or the ending of a relationship. This kind of depression is considered normal, is short lived, and resolves within a few days to 2 weeks. Depression experienced after the loss of a loved one (bereavement) often lasts longer than 2 weeks but normally gets better with time. 2. Clinical depression. This kind of depression lasts longer than normal sadness or normal grief or interferes with your ability to function at home, at work, and in school. It also interferes with your personal relationships. It affects almost every aspect of your life. Clinical depression is an illness. Symptoms of depression can also be caused by conditions other than those mentioned above, such as:  Physical illness. Some physical illnesses, including underactive thyroid gland (hypothyroidism), severe anemia, specific types of cancer, diabetes, uncontrolled seizures, heart and lung problems, strokes, and chronic pain are commonly associated with symptoms of depression.  Side effects of some prescription medicine. In some people, certain types of medicine can cause symptoms of depression.  Substance abuse. Abuse of alcohol and illicit drugs can cause symptoms of depression. SYMPTOMS Symptoms of normal sadness and normal grief include the following:  Feeling sad or crying for short periods of time.  Not caring about anything (apathy).  Difficulty sleeping or sleeping too much.  No longer able to enjoy the things you used to enjoy.  Desire to be by oneself all the time (social isolation).  Lack of energy or motivation.  Difficulty concentrating or  remembering.  Change in appetite or weight.  Restlessness or agitation. Symptoms of clinical depression include the same symptoms of normal sadness or normal grief and also the following symptoms:  Feeling sad or crying all the time.  Feelings of guilt or worthlessness.  Feelings of hopelessness or helplessness.  Thoughts of suicide or the desire to harm yourself (suicidal ideation).  Loss of touch with reality (psychotic symptoms). Seeing or hearing things that are not real (hallucinations) or having false beliefs about your life or the people around you (delusions and paranoia). DIAGNOSIS  The diagnosis of clinical depression is usually based on how bad the symptoms are and how long they have lasted. Your health care provider will also ask you questions about your medical history and substance use to find out if physical illness, use of prescription medicine, or substance abuse is causing your depression. Your health care provider may also order blood tests. TREATMENT  Often, normal sadness and normal grief do not require treatment. However, sometimes antidepressant medicine is given for bereavement to ease the depressive symptoms until they resolve. The treatment for clinical depression depends on how bad the symptoms are but often includes antidepressant medicine, counseling with a mental health professional, or both. Your health care provider will help to determine what treatment is best for you. Depression caused by physical illness usually goes away with appropriate medical treatment of the illness. If prescription medicine is causing depression, talk with your health care provider about stopping the medicine, decreasing the dose, or changing to another medicine. Depression caused by the abuse of alcohol or illicit drugs goes away when you stop using these substances. Some adults need  professional help in order to stop drinking or using drugs. SEEK IMMEDIATE MEDICAL CARE IF:  You have  thoughts about hurting yourself or others.  You lose touch with reality (have psychotic symptoms).  You are taking medicine for depression and have a serious side effect. FOR MORE INFORMATION  National Alliance on Mental Illness: www.nami.[employer] of Mental Health: http://www.maynard.net/ Document Released: 03/01/2000 Document Revised: 07/19/2013 Document Reviewed: 06/03/2011 [facility] Patient Information 2015 [place], [place]. This information is not intended to replace advice given to you by your health care provider. Make sure you discuss any questions you have with your health care provider.   Emergency Department Resource Guide 1) Find a Doctor and Pay Out of Pocket Although you won't have to find out who is covered by your insurance plan, it is a good idea to ask around and get recommendations. You will then need to call the office and see if the doctor you have chosen will accept you as a new patient and what types of options they offer for patients who are self-pay. Some doctors offer discounts or will set up payment plans for their patients who do not have insurance, but you will need to ask so you aren't surprised when you get to your appointment.  2) Contact Your Local Health Department Not all health departments have doctors that can see patients for sick visits, but many do, so it is worth a call to see if yours does. If you don't know where your local health department is, you can check in your phone book. The CDC also has a tool to help you locate your state's health department, and many state websites also have listings of all of their local health departments.  3) Find a Walk-in Clinic If your illness is not likely to be very severe or complicated, you may want to try a walk in clinic. These are popping up all over the country in pharmacies, drugstores, and shopping centers. They're usually staffed by nurse practitioners or physician assistants that have been trained to  treat common illnesses and complaints. They're usually fairly quick and inexpensive. However, if you have serious medical issues or chronic medical problems, these are probably not your best option.  No Primary Care Doctor: - Call Health Connect at  [phone] - they can help you locate a primary care doctor that  accepts your insurance, provides certain services, etc. - Physician Referral Service- [phone]  Chronic Pain Problems: Organization         Address  Phone   Notes  [facility]  [phone] Patients need to be referred by their primary care doctor.   Medication Assistance: Organization         Address  Phone   Notes  [facility] Medication [facility] [address] [place], [place] [address] [phone] --Must be a resident of [facility] -- Must have NO insurance coverage whatsoever (no Medicaid/ Medicare, etc.) -- The pt. MUST have a primary care doctor that directs their care regularly and follows them in the community   [facility]ist  [phone]   [employer]  [phone]    Agencies that provide inexpensive medical care: Organization         Address  Phone   Notes  [facility]  [phone]   [facility]    [phone]   [facility] [address] [place], [place] [address] [phone]  [facility] [address], °[place] (336) [phone]   °[facility]    (336) 373-0678   °[facility]    (336) 272-1050   °[facility] ° [address], [place] Phone:  (336) [phone], Fax:  (336) [phone] Hours of Operation:  9 am - 6 pm, M-F.  Also accepts Medicaid/Medicare and self-pay.  °[facility] ° [address], [place] Phone: ([phone]) [phone], Fax: ([phone]) [phone]. Hours of Operation:  8:30 am - 5:30 pm, M-F.  Also accepts Medicaid and self-pay.  °[facility]  [place] [address], [place] Phone: (336) [phone]   °[facility] [address], [place], [place] (336)[phone], Ext. 123 Mondays & Thursdays: 7-9 AM.  First 15 patients are seen on a first come, first serve basis. °  ° °Medicaid-accepting [place] Providers: ° °Organization         Address  Phone   Notes  °[facility] [address], [place] ([phone]) [phone] Also accepts self-pay patients.  °[facility] [address], [place] ° (336) [phone]   °[facility] [address], [place] (336) 288-8857   °[facility] [address] [place] [address], [place] (336) 299-7000   °Veita Bland [address], [place]  ° ([phone]) [phone] Only accepts [place] Access Medicaid patients after they have their name applied to their card.  ° °Self-Pay (no insurance) in [place]: ° °Organization         Address  Phone   Notes  °Sickle Cell Patients, [facility] [address], [place] (336) [phone]   °[facility] [address], [place] (336) [phone]   °[facility] ° [address], [place] (336) [phone]   °[facility]/Dr. Osei-Bonsu ° [address] [place] [address], [place] or [address], [place] ([phone]) [phone] Phone number for both [place] and [place] locations is the same.  °[facility] [address], [place] (336) [phone]   °[facility] [address] [place] [address], [place] or [address] (336) 852-7530 °(336) 878-2260   °[facility] [address],  (336) 350-1642, phone; (336) [phone], fax Sees patients 1st and 3rd Saturday of every month.  Must not qualify for public or private insurance (i.e. Medicaid, Medicare, [place] Health Choice, Veterans' Benefits) • Household income should be no more than 200%  of the poverty level •The clinic cannot treat you if you are pregnant or think you are pregnant • Sexually transmitted diseases are not treated at the clinic.  ° ° °Dental Care: °Organization         Address  Phone  Notes  °[place] [facility] [address],  (336) [phone] Accepts children up to age 21 who are enrolled in Medicaid or [place] Health Choice; pregnant women with a Medicaid card; and children who have applied for Medicaid or [place] Health Choice, but were declined, whose parents can pay a reduced fee at time of service.  °[place] [facility] [place]  [address], [place] (336) 641-7733 Accepts children up to age 21 who are enrolled in Medicaid or  Health Choice; pregnant women with a Medicaid card; and   children who have applied for Medicaid or [place] Health Choice, but were declined, whose parents can pay a reduced fee at time of service.  [facility]  [place] [phone] Patients are seen by appointment only. Walk-ins are not accepted. [place] will see patients 49 years of age and older. Monday - Tuesday (8am-5pm) Most Wednesdays (8:30-5pm) $30 per visit, cash only  [facility] Adult [facility] Access PRO[facility]  [address], [facility] [phone] Patients are seen by appointment only. Walk-ins are not accepted. [place] will see patients 12 years of age and older. One Wednesday Evening (Monthly: Volunteer Based).  $30 per visit, cash only  [place]  [phone] for adults; Children under age 36, call Graduate Pediatric Dentistry at [phone]. Children aged 37-14, please call [phone] to request a pediatric application.  Dental services are provided in all areas of dental care including fillings, crowns and bridges, complete and partial dentures, implants, gum treatment, root canals, and  extractions. Preventive care is also provided. Treatment is provided to both adults and children. Patients are selected via a lottery and there is often a waiting list.   [facility] [address], [place]  [phone] [email or web address]   [facility] [address] [place], [place] [phone], Ext. 123 Second and Fourth Thursday of each month, opens at 6:30 AM; Clinic ends at 9 AM.  Patients are seen on a first-come first-served basis, and a limited number are seen during each clinic.   [facility]  [address] Hillard Danker [place], [place] [phone]   Eligibility Requirements You must have lived in [place], [place], or [place] counties for at least the last three months.   You cannot be eligible for state or federal sponsored [employer], including [employer], [place], or [employer].   You generally cannot be eligible for healthcare insurance through your employer.    How to apply: Eligibility screenings are held every Tuesday and Wednesday afternoon from 1:00 pm until 4:00 pm. You do not need an appointment for the interview!  [facility] [address] [place], [place]   [place]  [place] Department  [place]  [phone]    Behavioral Health Resources in the Community: Intensive Outpatient Programs Organization         Address  Phone  Notes  [facility] [place] [address], [place], [place] [phone]   [facility] [address], [place], [place]   [facility] [address] [place], [place]   [place] [address],  [place], [place] or (919)[phone]   Substance Abuse Resources Organization         Address  Phone  Notes  [facility]  [phone]     [place]  [phone]   [facility] [place]   Chinita Pester  320-784-7227   Residential & Outpatient Substance Abuse Program  8066303800   Psychological Services Organization         Address  Phone  Notes  [facility] [place]  [place]  [phone]   [place] [address] [place] or [phone]    Mobile Crisis Teams Organization  Address  Phone  Notes  °[facility], Mobile Crisis Care Unit  1-877-626-1772   °Assertive °[facility] ° [address] [place], [place] 336-834-9664   °Sharon DeEsch [address] °[place] 336-554-5454   ° °Self-Help/Support Groups °Organization         Address  Phone             Notes  °[facility] [place] - variety of support groups  336- 373-1402 Call for more information  °Narcotics Anonymous (NA), [facility] [address], °[place]  2 meetings at this location  ° °Residential Treatment Programs °Organization         Address  Phone  Notes  °[facility] [address],    °[place]  1-866-801-8205   °[facility] ° [address], [place], [place] [phone]   °[facility] [address], [place] [phone] Admissions: 8am-3pm M-F  °[facility] [address]    °[place], [place] 336-841-1104   °[facility] [address], [place], [place] 336-379-7146   °[facility] [address]  °[place], [place] 336-285-9073   °[facility] - Intensive Outpatient [address], [place], [place] 336-852-3033   °[facility] [address]  °[place], [place] 1-877-615-2722 or 336-784-9470   °[facility] ([facility]) [address] [place], [place] 336-227-7417 Accepts Medicaid  °[facility] [address]   ° [place] 1-800-659-3381 Substance Abuse/Addiction Treatment  ° °[place] Behavioral Health Resources °Organization         Address  Phone  Notes  °[facility]  (888) 581-9988   °Julie Brannon, PhD [address] [place], [place]   (336) 349-5553 or (336) 951-0000   °[facility]   [address] °[place], [place] (336) 349-4454   °[facility] [address], [place], [place] (336) 342-8316 Insurance/Medicaid/sponsorship through [facility]  °[facility] [address] [place] [address]                                    [place], [place] (336) 342-8316 Therapy/tele-psych/case  °[facility] [address]  ° [place], [place] (336) 349-2233    °Dr. Arfeen  (336) 349-4544   °[facility] [place]  [employer] [place] [facility] 1) [address], [place] °2) [address], [place] °3)  [address], [place] (336) 349-3220 °(336) 342-7768 ° °(336) 342-8140   °[place] Child Abuse Hotline (336) 342-1394 or (336) 342-3537 (After Hours)    ° ° ° °

## 2013-11-14 NOTE — Progress Notes (Signed)
ED CM noted patient to be without a PCP or health coverage. Pt presented to [facility] ED with c/o feelings of  anxiousness, and depression. Reviewed record, ED CM met with patient at bedside. Patient reports frequent mood swings.  She states, that she has not taken meds in a while. Patient denies any SI/HI. Offered assistance with finding PCP, and health coverage, patient is agreeable to the assistance. Discussed affordable health care resources, Provided patient with information  regarding [place] and [facility] walk-in clinic. Patient verbalized understanding teach back done. Updated Shyrl Numbers PA-C and Natalie RN on [place] C

## 2013-11-14 NOTE — BH Assessment (Signed)
[facility] Assessment Progress Note   Called and obtained clinical information on pt from [facility] @ 1020, as a tele assessment was ordered for the pt.  Called pt's nurse, Dorene Grebe, and pt's tele assessment scheduled for 1035 with this clinician.  Casimer Lanius, MS, [facility] Licensed Professional Counselor Triage Specialist
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
## 2013-11-14 NOTE — ED Notes (Addendum)
Kristen from [facility] has called and cleared the pt go home. sts she spoke with the staff there and they would like the pt to follow up [facility].  Pt can be discharged at this time with resources.

## 2013-11-14 NOTE — ED Notes (Signed)
Pt finished tele-psych.  

## 2013-11-14 NOTE — ED Notes (Signed)
Patient here because her "Mental health symptoms are flaring up." RN asked patient to elaborate on complaint and patient reports she is feeling depressed and anxious because she can't focus and "function in society." Patient also states she is having mood swings. Patient is denying SI/HI at this time. Patient states she is not taking her meds b/c "I don't know what happened to them."

## 2013-11-14 NOTE — ED Provider Notes (Signed)
CSN: [id number]     Arrival date & time 11/14/13  0623 History   First MD Initiated Contact with Patient 11/14/13 [phone]     Chief Complaint  Patient presents with  . Depression  . Anxiety     (Consider location/radiation/quality/duration/timing/severity/associated sxs/prior Treatment) HPI Comments: Patient is a 28 year old female with history of depression and anxiety who presents today with exacerbation of depression. She reports that her "mental health symptoms" are flaring up. The patient reports she cannot concentrate in school or "function in society". She is not taking her prozac as prescribed. She has mood swings. She reports auditory hallucinations to me. She denies SI/HI. No alcohol or drug use. No physical complaints including chest pain, shortness of breath, fevers, nausea, vomiting.   The history is provided by the patient. No language interpreter was used.    Past Medical History  Diagnosis Date  . Abscess   . Depression   . Obesity   . Anxiety    Past Surgical History  Procedure Laterality Date  . Appendectomy     Family History  Problem Relation Age of Onset  . Cancer Other   . Hypertension Other   . Diabetes Other    History  Substance Use Topics  . Smoking status: Current Every Day Smoker -- 1.00 packs/day    Types: Cigarettes  . Smokeless tobacco: Never Used  . Alcohol Use: 1.2 oz/week    2 Shots of liquor per week     Comment: on weekends   OB History   Grav Para Term Preterm Abortions TAB SAB Ect Mult Living                 Review of Systems  Constitutional: Negative for fever and chills.  Respiratory: Negative for shortness of breath.   Cardiovascular: Negative for chest pain.  Gastrointestinal: Negative for nausea, vomiting and abdominal pain.  Psychiatric/Behavioral: Positive for dysphoric mood and decreased concentration.  All other systems reviewed and are negative.     Allergies  Review of patient's allergies indicates no known  allergies.  Home Medications   Prior to Admission medications   Medication Sig Start Date End Date Taking? Authorizing Provider  albuterol (PROVENTIL HFA;VENTOLIN HFA) 108 (90 BASE) MCG/ACT inhaler Inhale 1 puff into the lungs every 6 (six) hours as needed for wheezing or shortness of breath.   Yes Historical Provider, MD   BP 119/62  Pulse 66  Temp(Src) 98.1 F (36.7 C) (Oral)  Resp 18  SpO2 100%  LMP 11/07/2013 Physical Exam  Nursing note and vitals reviewed. Constitutional: She is oriented to person, place, and time. She appears well-developed and well-nourished. No distress.  obese  HENT:  Head: Normocephalic and atraumatic.  Right Ear: External ear normal.  Left Ear: External ear normal.  Nose: Nose normal.  Mouth/Throat: Oropharynx is clear and moist.  Eyes: Conjunctivae are normal.  Neck: Normal range of motion.  Cardiovascular: Normal rate, regular rhythm and normal heart sounds.   Pulmonary/Chest: Effort normal and breath sounds normal. No stridor. No respiratory distress. She has no wheezes. She has no rales.  Abdominal: Soft. She exhibits no distension.  Musculoskeletal: Normal range of motion.  Neurological: She is alert and oriented to person, place, and time. She has normal strength.  Skin: Skin is warm and dry. She is not diaphoretic. No erythema.  Psychiatric: Her behavior is normal. She exhibits a depressed mood.    ED Course  Procedures (including critical care time) Labs Review Labs Reviewed  SALICYLATE LEVEL - Abnormal; Notable for the following:    Salicylate Lvl <2.0 (*)    All other components within normal limits  URINE RAPID DRUG SCREEN (HOSP PERFORMED) - Abnormal; Notable for the following:    Tetrahydrocannabinol POSITIVE (*)    All other components within normal limits  CBC WITH DIFFERENTIAL  BASIC METABOLIC PANEL  ETHANOL  ACETAMINOPHEN LEVEL  POC URINE PREG, ED    Imaging Review Dg Chest 2 View  11/13/2013   CLINICAL DATA:  Cough  and sore throat.  EXAM: CHEST  2 VIEW  COMPARISON:  Report of two-view chest 08/26/2011  FINDINGS: The heart size is normal. The lungs are clear. Mild rightward curvature of the lower thoracic spine is stable. The visualized soft tissues and bony thorax are otherwise unremarkable.  IMPRESSION: 1. No acute cardiopulmonary disease or significant interval change.   Electronically Signed   By: Gennette Pac M.D.   On: 11/13/2013 02:07     EKG Interpretation None      MDM   Final diagnoses:  Anxiety  Depression    Patient presents to ED for anxiety and depression. To me she reports auditory hallucinations, but denies this to TTS and psychiatry. Patient without SI or HI. She was encouraged to take her home prozac and follow up with psychiatry as an outpatient. This was discussed with patient and she is agreeable to plan. Discussed reasons to return to ED. Vital signs stable for discharge. Patient / Family / Caregiver informed of clinical course, understand medical decision-making process, and agree with plan.    Mora Bellman, PA-C 11/16/13 [phone]

## 2013-11-14 NOTE — ED Notes (Signed)
Tele-psych machine at bedside and turned on.

## 2013-11-14 NOTE — ED Notes (Signed)
Telepsych in process 

## 2013-11-18 NOTE — ED Provider Notes (Signed)
Medical screening examination/treatment/procedure(s) were performed by non-physician practitioner and as supervising physician I was immediately available for consultation/collaboration.   EKG Interpretation None       Derwood Kaplan, MD 11/18/13 [phone]

## 2013-12-09 ENCOUNTER — Encounter (HOSPITAL_COMMUNITY): Payer: Self-pay | Admitting: Emergency Medicine

## 2013-12-09 DIAGNOSIS — Z872 Personal history of diseases of the skin and subcutaneous tissue: Secondary | ICD-10-CM | POA: Insufficient documentation

## 2013-12-09 DIAGNOSIS — F17200 Nicotine dependence, unspecified, uncomplicated: Secondary | ICD-10-CM | POA: Insufficient documentation

## 2013-12-09 DIAGNOSIS — R319 Hematuria, unspecified: Secondary | ICD-10-CM | POA: Insufficient documentation

## 2013-12-09 DIAGNOSIS — Z3202 Encounter for pregnancy test, result negative: Secondary | ICD-10-CM | POA: Insufficient documentation

## 2013-12-09 DIAGNOSIS — Z8659 Personal history of other mental and behavioral disorders: Secondary | ICD-10-CM | POA: Insufficient documentation

## 2013-12-09 DIAGNOSIS — R300 Dysuria: Secondary | ICD-10-CM | POA: Insufficient documentation

## 2013-12-09 DIAGNOSIS — Z79899 Other long term (current) drug therapy: Secondary | ICD-10-CM | POA: Insufficient documentation

## 2013-12-09 DIAGNOSIS — R350 Frequency of micturition: Secondary | ICD-10-CM | POA: Insufficient documentation

## 2013-12-09 DIAGNOSIS — E669 Obesity, unspecified: Secondary | ICD-10-CM | POA: Insufficient documentation

## 2013-12-09 NOTE — ED Notes (Signed)
The         Pt is unable to ujrinate she just went to the br

## 2013-12-09 NOTE — ED Notes (Signed)
Painful urination for one year.  She has frequency and she feelslike she cannot empty her bladder.  lmp now

## 2013-12-10 ENCOUNTER — Emergency Department (HOSPITAL_COMMUNITY)
Admission: EM | Admit: 2013-12-10 | Discharge: 2013-12-10 | Disposition: A | Discharge status: Home / Self Care | Payer: Self-pay | Attending: Emergency Medicine | Admitting: Emergency Medicine

## 2013-12-10 DIAGNOSIS — R319 Hematuria, unspecified: Secondary | ICD-10-CM

## 2013-12-10 DIAGNOSIS — R300 Dysuria: Secondary | ICD-10-CM

## 2013-12-10 LAB — URINE MICROSCOPIC-ADD ON

## 2013-12-10 LAB — PREGNANCY, URINE: PREG TEST UR: NEGATIVE

## 2013-12-10 LAB — URINALYSIS, ROUTINE W REFLEX MICROSCOPIC
Glucose, UA: NEGATIVE mg/dL
Ketones, ur: >80 mg/dL — AB
Leukocytes, UA: NEGATIVE
NITRITE: NEGATIVE
Protein, ur: NEGATIVE mg/dL
SPECIFIC GRAVITY, URINE: 1.03 (ref 1.005–1.030)
Urobilinogen, UA: 1 mg/dL (ref 0.0–1.0)
pH: 5 (ref 5.0–8.0)

## 2013-12-10 MED ORDER — PHENAZOPYRIDINE HCL 200 MG PO TABS: 200.0000 mg | ORAL_TABLET | Freq: Three times a day (TID) | ORAL | Status: DC

## 2013-12-10 NOTE — Discharge Instructions (Signed)
If you were given medicines take as directed.  If you are on coumadin or contraceptives realize their levels and effectiveness is altered by many different medicines.  If you have any reaction (rash, tongues swelling, other) to the medicines stop taking and see a physician.  Follow up culture results in 2-3 days Please follow up as directed and return to the ER or see a physician for new or worsening symptoms.  Thank you. Filed Vitals:   12/09/13 2337 12/10/13 0143  BP: 140/78 137/68  Pulse: 76 72  Temp: 98.1 F (36.7 C) 97.7 F (36.5 C)  TempSrc:  Oral  Resp: 22 18  Height:  (1.702 m)   Weight: 317 lb (143.79 kg)   SpO2: 98% 98%

## 2013-12-10 NOTE — ED Notes (Signed)
Pt. Refused wheelchair. Pt. VSS and A&Ox4.

## 2013-12-10 NOTE — ED Provider Notes (Signed)
CSN: [id number]     Arrival date & time 12/09/13  2309 History   First MD Initiated Contact with Patient 12/10/13 0138     Chief Complaint  Patient presents with  . Urinary Frequency     (Consider location/radiation/quality/duration/timing/severity/associated sxs/prior Treatment) HPI Comments: 28 year old female with anxiety, depression presents with recurrent dysuria.  Patient recently had a urine, urine culture and pelvic exam with cultures done and treated for BV. Patient is not sexually active. Patient denies vaginal discharge or bleeding. Patient denies abdominal pain or vomiting.  Patient is a 28 y.o. female presenting with frequency. The history is provided by the patient.  Urinary Frequency Pertinent negatives include no abdominal pain and no headaches.    Past Medical History  Diagnosis Date  . Abscess   . Depression   . Obesity   . Anxiety    Past Surgical History  Procedure Laterality Date  . Appendectomy     Family History  Problem Relation Age of Onset  . Cancer Other   . Hypertension Other   . Diabetes Other    History  Substance Use Topics  . Smoking status: Current Every Day Smoker -- 1.00 packs/day    Types: Cigarettes  . Smokeless tobacco: Never Used  . Alcohol Use: 1.2 oz/week    2 Shots of liquor per week     Comment: on weekends   OB History   Grav Para Term Preterm Abortions TAB SAB Ect Mult Living                 Review of Systems  Constitutional: Negative for fever and chills.  Gastrointestinal: Negative for vomiting and abdominal pain.  Genitourinary: Positive for dysuria and frequency. Negative for flank pain.  Musculoskeletal: Negative for back pain, neck pain and neck stiffness.  Skin: Negative for rash.  Neurological: Negative for light-headedness and headaches.      Allergies  Review of patient's allergies indicates no known allergies.  Home Medications   Prior to Admission medications   Medication Sig Start Date End Date  Taking? Authorizing Provider  albuterol (PROVENTIL HFA;VENTOLIN HFA) 108 (90 BASE) MCG/ACT inhaler Inhale 1 puff into the lungs every 6 (six) hours as needed for wheezing or shortness of breath.   Yes Historical Provider, MD  phenazopyridine (PYRIDIUM) 200 MG tablet Take 1 tablet (200 mg total) by mouth 3 (three) times daily. 12/10/13   Enid Skeens, MD   BP 137/68  Pulse 72  Temp(Src) 97.7 F (36.5 C) (Oral)  Resp 18  Ht  (1.702 m)  Wt 317 lb (143.79 kg)  BMI 49.64 kg/m2  SpO2 98%  LMP 12/08/2013 Physical Exam  Nursing note and vitals reviewed. Constitutional: She is oriented to person, place, and time. She appears well-developed and well-nourished.  HENT:  Head: Normocephalic and atraumatic.  Eyes: Conjunctivae are normal. Right eye exhibits no discharge. Left eye exhibits no discharge.  Neck: Normal range of motion. Neck supple. No tracheal deviation present.  Cardiovascular: Normal rate.   Pulmonary/Chest: Effort normal and breath sounds normal.  Abdominal: Soft. She exhibits no distension. There is no tenderness. There is no guarding.  Musculoskeletal: She exhibits no edema.  Neurological: She is alert and oriented to person, place, and time.  Skin: Skin is warm. No rash noted.  Psychiatric: She has a normal mood and affect.    ED Course  Procedures (including critical care time) Labs Review Labs Reviewed  URINALYSIS, ROUTINE W REFLEX MICROSCOPIC - Abnormal; Notable for the following:  Color, Urine AMBER (*)    APPearance CLOUDY (*)    Hgb urine dipstick LARGE (*)    Bilirubin Urine MODERATE (*)    Ketones, ur >80 (*)    All other components within normal limits  URINE MICROSCOPIC-ADD ON - Abnormal; Notable for the following:    Squamous Epithelial / LPF FEW (*)    All other components within normal limits  URINE CULTURE  PREGNANCY, URINE    Imaging Review No results found.   EKG Interpretation None      MDM   Final diagnoses:  Dysuria   Hematuria   Well-appearing female with urinary symptoms. Clinically UTI. Patient recently had pelvic and cultures done per her report. No vaginal symptoms in ER. Urinalysis pending, plan for pyridium and outpt fup.  Pt on menstrual cycle, hematuria likely secondary.  No flank pain or stone hx Results and differential diagnosis were discussed with the patient/parent/guardian. Close follow up outpatient was discussed, comfortable with the plan.   Medications - No data to display  Filed Vitals:   12/09/13 2337 12/10/13 0143  BP: 140/78 137/68  Pulse: 76 72  Temp: 98.1 F (36.7 C) 97.7 F (36.5 C)  TempSrc:  Oral  Resp: 22 18  Height:  (1.702 m)   Weight: 317 lb (143.79 kg)   SpO2: 98% 98%    Final diagnoses:  Dysuria        Enid Skeens, MD 12/10/13 [phone]

## 2013-12-12 LAB — URINE CULTURE: Colony Count: 50000

## 2013-12-13 ENCOUNTER — Emergency Department (HOSPITAL_COMMUNITY)
Admission: EM | Admit: 2013-12-13 | Discharge: 2013-12-14 | Disposition: A | Discharge status: Home / Self Care | Payer: Self-pay | Attending: Emergency Medicine | Admitting: Emergency Medicine

## 2013-12-13 ENCOUNTER — Encounter (HOSPITAL_COMMUNITY): Payer: Self-pay | Admitting: Emergency Medicine

## 2013-12-13 ENCOUNTER — Telehealth (HOSPITAL_BASED_OUTPATIENT_CLINIC_OR_DEPARTMENT_OTHER): Payer: Self-pay | Admitting: Emergency Medicine

## 2013-12-13 ENCOUNTER — Emergency Department (HOSPITAL_COMMUNITY)
Admission: EM | Admit: 2013-12-13 | Discharge: 2013-12-13 | Disposition: A | Discharge status: Home / Self Care | Payer: Self-pay | Attending: Emergency Medicine | Admitting: Emergency Medicine

## 2013-12-13 DIAGNOSIS — Z3202 Encounter for pregnancy test, result negative: Secondary | ICD-10-CM | POA: Insufficient documentation

## 2013-12-13 DIAGNOSIS — M25579 Pain in unspecified ankle and joints of unspecified foot: Secondary | ICD-10-CM | POA: Insufficient documentation

## 2013-12-13 DIAGNOSIS — R05 Cough: Secondary | ICD-10-CM

## 2013-12-13 DIAGNOSIS — Z79899 Other long term (current) drug therapy: Secondary | ICD-10-CM | POA: Insufficient documentation

## 2013-12-13 DIAGNOSIS — Z872 Personal history of diseases of the skin and subcutaneous tissue: Secondary | ICD-10-CM | POA: Insufficient documentation

## 2013-12-13 DIAGNOSIS — F17200 Nicotine dependence, unspecified, uncomplicated: Secondary | ICD-10-CM | POA: Insufficient documentation

## 2013-12-13 DIAGNOSIS — Z8659 Personal history of other mental and behavioral disorders: Secondary | ICD-10-CM | POA: Insufficient documentation

## 2013-12-13 DIAGNOSIS — Z720 Tobacco use: Secondary | ICD-10-CM

## 2013-12-13 DIAGNOSIS — R053 Chronic cough: Secondary | ICD-10-CM

## 2013-12-13 DIAGNOSIS — F329 Major depressive disorder, single episode, unspecified: Secondary | ICD-10-CM

## 2013-12-13 DIAGNOSIS — J3489 Other specified disorders of nose and nasal sinuses: Secondary | ICD-10-CM | POA: Insufficient documentation

## 2013-12-13 DIAGNOSIS — F331 Major depressive disorder, recurrent, moderate: Secondary | ICD-10-CM | POA: Diagnosis present

## 2013-12-13 DIAGNOSIS — R059 Cough, unspecified: Secondary | ICD-10-CM | POA: Insufficient documentation

## 2013-12-13 DIAGNOSIS — IMO0001 Reserved for inherently not codable concepts without codable children: Secondary | ICD-10-CM | POA: Insufficient documentation

## 2013-12-13 DIAGNOSIS — E669 Obesity, unspecified: Secondary | ICD-10-CM | POA: Insufficient documentation

## 2013-12-13 DIAGNOSIS — Z008 Encounter for other general examination: Secondary | ICD-10-CM | POA: Insufficient documentation

## 2013-12-13 DIAGNOSIS — F3289 Other specified depressive episodes: Secondary | ICD-10-CM | POA: Insufficient documentation

## 2013-12-13 DIAGNOSIS — F32A Depression, unspecified: Secondary | ICD-10-CM

## 2013-12-13 LAB — COMPREHENSIVE METABOLIC PANEL
ALBUMIN: 3.6 g/dL (ref 3.5–5.2)
ALT: 10 U/L (ref 0–35)
ANION GAP: 13 (ref 5–15)
AST: 19 U/L (ref 0–37)
Alkaline Phosphatase: 64 U/L (ref 39–117)
BUN: 5 mg/dL — AB (ref 6–23)
CO2: 24 mEq/L (ref 19–32)
Calcium: 9.2 mg/dL (ref 8.4–10.5)
Chloride: 99 mEq/L (ref 96–112)
Creatinine, Ser: 0.78 mg/dL (ref 0.50–1.10)
GFR calc Af Amer: >90 mL/min (ref >90)
GFR calc non Af Amer: >90 mL/min (ref >90)
Glucose, Bld: 85 mg/dL (ref 70–99)
POTASSIUM: 3.9 meq/L (ref 3.7–5.3)
SODIUM: 136 meq/L — AB (ref 137–147)
TOTAL PROTEIN: 6.8 g/dL (ref 6.0–8.3)
Total Bilirubin: 0.3 mg/dL (ref 0.3–1.2)

## 2013-12-13 LAB — CBC
HCT: 42.9 % (ref 36.0–46.0)
Hemoglobin: 14.1 g/dL (ref 12.0–15.0)
MCH: 28.8 pg (ref 26.0–34.0)
MCHC: 32.9 g/dL (ref 30.0–36.0)
MCV: 87.6 fL (ref 78.0–100.0)
Platelets: 244 10*3/uL (ref 150–400)
RBC: 4.9 MIL/uL (ref 3.87–5.11)
RDW: 14.7 % (ref 11.5–15.5)
WBC: 7.6 10*3/uL (ref 4.0–10.5)

## 2013-12-13 LAB — ETHANOL: Alcohol, Ethyl (B): <11 mg/dL (ref 0–11)

## 2013-12-13 LAB — ACETAMINOPHEN LEVEL

## 2013-12-13 LAB — SALICYLATE LEVEL: Salicylate Lvl: <2 mg/dL — ABNORMAL LOW (ref 2.8–20.0)

## 2013-12-13 MED ORDER — GUAIFENESIN ER 600 MG PO TB12: 1200.0000 mg | ORAL_TABLET | Freq: Two times a day (BID) | ORAL | Status: DC

## 2013-12-13 MED ORDER — BENZONATATE 100 MG PO CAPS: 100.0000 mg | ORAL_CAPSULE | Freq: Three times a day (TID) | ORAL | Status: DC

## 2013-12-13 NOTE — ED Notes (Signed)
Pt c/o cough and sore throat for three months.

## 2013-12-13 NOTE — ED Notes (Signed)
Pt discharged home with all belongings, pt alert,oriented and ambulatory upon discharge. 2 new RX prescribed, pt verbalizes understanding of discharge instructions, pt drove self home, no narcotics given in ED

## 2013-12-13 NOTE — ED Notes (Signed)
Pt wants to know why she keeps coughing and her throat is sore. Pt is a current everyday smoker.

## 2013-12-13 NOTE — ED Provider Notes (Signed)
CSN: [id number]     Arrival date & time 12/13/13  0213 History   First MD Initiated Contact with Patient 12/13/13 [phone]     Chief Complaint  Patient presents with  . Cough  . Nasal Congestion  . Sore Throat     (Consider location/radiation/quality/duration/timing/severity/associated sxs/prior Treatment) HPI 28 yo female presents to the ER with complaint of 2-4 months of daily cough, congestion.  Pt seen for same about 1 month ago, was given inhaler.  Pt wishes to know why she still has symptoms.  Pt is a pack a day smoker.  She denies any ear pain, fevers, chills.  Sputum brown in color.  No chest pain.  Cough mainly when walking. Past Medical History  Diagnosis Date  . Abscess   . Depression   . Obesity   . Anxiety    Past Surgical History  Procedure Laterality Date  . Appendectomy     Family History  Problem Relation Age of Onset  . Cancer Other   . Hypertension Other   . Diabetes Other    History  Substance Use Topics  . Smoking status: Current Every Day Smoker -- 1.00 packs/day    Types: Cigarettes  . Smokeless tobacco: Never Used  . Alcohol Use: 1.2 oz/week    2 Shots of liquor per week     Comment: on weekends   OB History   Grav Para Term Preterm Abortions TAB SAB Ect Mult Living                 Review of Systems  All other systems reviewed and are negative.     Allergies  Review of patient's allergies indicates no known allergies.  Home Medications   Prior to Admission medications   Medication Sig Start Date End Date Taking? Authorizing Provider  albuterol (PROVENTIL HFA;VENTOLIN HFA) 108 (90 BASE) MCG/ACT inhaler Inhale 1 puff into the lungs every 6 (six) hours as needed for wheezing or shortness of breath.    Historical Provider, MD  benzonatate (TESSALON) 100 MG capsule Take 1 capsule (100 mg total) by mouth every 8 (eight) hours. 12/13/13   Olivia Mackie, MD  guaiFENesin (MUCINEX) 600 MG 12 hr tablet Take 2 tablets (1,200 mg total) by mouth 2 (two)  times daily. 12/13/13   Olivia Mackie, MD  phenazopyridine (PYRIDIUM) 200 MG tablet Take 1 tablet (200 mg total) by mouth 3 (three) times daily. 12/10/13   Enid Skeens, MD   BP 143/70  Pulse 80  Temp(Src) 97.9 F (36.6 C) (Oral)  Resp 18  SpO2 98%  LMP 12/08/2013 Physical Exam  Nursing note and vitals reviewed. Constitutional: She is oriented to person, place, and time. She appears well-developed and well-nourished.  HENT:  Head: Normocephalic and atraumatic.  Nose: Nose normal.  Mouth/Throat: Oropharynx is clear and moist.  Eyes: Conjunctivae and EOM are normal. Pupils are equal, round, and reactive to light.  Neck: Normal range of motion. Neck supple. No JVD present. No tracheal deviation present. No thyromegaly present.  Cardiovascular: Normal rate, regular rhythm, normal heart sounds and intact distal pulses.  Exam reveals no gallop and no friction rub.   No murmur heard. Pulmonary/Chest: Effort normal and breath sounds normal. No stridor. No respiratory distress. She has no wheezes. She has no rales. She exhibits no tenderness.  No cough noted  Abdominal: Soft. Bowel sounds are normal. She exhibits no distension and no mass. There is no tenderness. There is no rebound and no guarding.  Musculoskeletal: Normal range of motion. She exhibits no edema and no tenderness.  Lymphadenopathy:    She has no cervical adenopathy.  Neurological: She is alert and oriented to person, place, and time. She displays normal reflexes. She exhibits normal muscle tone. Coordination normal.  Skin: Skin is warm and dry. No rash noted. No erythema. No pallor.  Psychiatric: She has a normal mood and affect. Her behavior is normal. Judgment and thought content normal.    ED Course  Procedures (including critical care time) Labs Review Labs Reviewed - No data to display  Imaging Review No results found.   EKG Interpretation None      MDM   Final diagnoses:  Chronic cough  Tobacco abuse     28 yo female with ongoing cough, congestion.  Discussed tobacco cessation and f/u with pcm, symptom control.    Olivia Mackie, MD 12/13/13 [phone]

## 2013-12-13 NOTE — Telephone Encounter (Signed)
Post ED Visit - Positive Culture Follow-up: Successful Patient Follow-Up  Culture assessed and recommendations reviewed by:  Wes Dulaney, Pharm.D., BCPS  Celedonio Miyamoto, Pharm.D., BCPS  Georgina Pillion, Pharm.D., BCPS  [place], [place].D., BCPS, AAHIVP  Estella Husk, Pharm.D., BCPS, AAHIVP  Red Christians, Pharm.D.  Tennis Must, Pharm.D.  Positive urine culture 50,000 colonies Group B strep   Patient discharged without antimicrobial prescription and treatment is now indicated  Organism is resistant to prescribed ED discharge antimicrobial  Patient with positive blood cultures  Changes discussed with ED provider:  Langston Masker PA    New antibiotic prescription Amoxicillin  po tid x 7 days Called to   Unable to reach patient via telephone, no number given, also no emergency contact listed, letter sent 12/13/13   Berle Mull 12/13/2013, 10:48 AM
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
## 2013-12-13 NOTE — Discharge Instructions (Signed)
Cool Mist Vaporizers Vaporizers may help relieve the symptoms of a cough and cold. They add moisture to the air, which helps mucus to become thinner and less sticky. This makes it easier to breathe and cough up secretions. Cool mist vaporizers do not cause serious burns like hot mist vaporizers, which may also be called steamers or humidifiers. Vaporizers have not been proven to help with colds. You should not use a vaporizer if you are allergic to mold. HOME CARE INSTRUCTIONS  Follow the package instructions for the vaporizer.  Do not use anything other than distilled water in the vaporizer.  Do not run the vaporizer all of the time. This can cause mold or bacteria to grow in the vaporizer.  Clean the vaporizer after each time it is used.  Clean and dry the vaporizer well before storing it.  Stop using the vaporizer if worsening respiratory symptoms develop. Document Released: 11/30/2003 Document Revised: 03/09/2013 Document Reviewed: 07/22/2012 [facility] Patient Information 2015 [place], [place]. This information is not intended to replace advice given to you by your health care provider. Make sure you discuss any questions you have with your health care provider.  Cough, Adult  A cough is a reflex that helps clear your throat and airways. It can help heal the body or may be a reaction to an irritated airway. A cough may only last 2 or 3 weeks (acute) or may last more than 8 weeks (chronic).  CAUSES Acute cough:  Viral or bacterial infections. Chronic cough:  Infections.  Allergies.  Asthma.  Post-nasal drip.  Smoking.  Heartburn or acid reflux.  Some medicines.  Chronic lung problems (COPD).  Cancer. SYMPTOMS   Cough.  Fever.  Chest pain.  Increased breathing rate.  High-pitched whistling sound when breathing (wheezing).  Colored mucus that you cough up (sputum). TREATMENT   A bacterial cough may be treated with antibiotic medicine.  A viral cough must run  its course and will not respond to antibiotics.  Your caregiver may recommend other treatments if you have a chronic cough. HOME CARE INSTRUCTIONS   Only take over-the-counter or prescription medicines for pain, discomfort, or fever as directed by your caregiver. Use cough suppressants only as directed by your caregiver.  Use a cold steam vaporizer or humidifier in your bedroom or home to help loosen secretions.  Sleep in a semi-upright position if your cough is worse at night.  Rest as needed.  Stop smoking if you smoke. SEEK IMMEDIATE MEDICAL CARE IF:   You have pus in your sputum.  Your cough starts to worsen.  You cannot control your cough with suppressants and are losing sleep.  You begin coughing up blood.  You have difficulty breathing.  You develop pain which is getting worse or is uncontrolled with medicine.  You have a fever. MAKE SURE YOU:   Understand these instructions.  Will watch your condition.  Will get help right away if you are not doing well or get worse. Document Released: 08/31/2010 Document Revised: 05/27/2011 Document Reviewed: 08/31/2010 [facility] Patient Information 2015 [place], [place]. This information is not intended to replace advice given to you by your health care provider. Make sure you discuss any questions you have with your health care provider.  Smoking Cessation Quitting smoking is important to your health and has many advantages. However, it is not always easy to quit since nicotine is a very addictive drug. Oftentimes, people try 3 times or more before being able to quit. This document explains the best  ways for you to prepare to quit smoking. Quitting takes hard work and a lot of effort, but you can do it. ADVANTAGES OF QUITTING SMOKING  You will live longer, feel better, and live better.  Your body will feel the impact of quitting smoking almost immediately.  Within 20 minutes, blood pressure decreases. Your pulse returns to its  normal level.  After 8 hours, carbon monoxide levels in the blood return to normal. Your oxygen level increases.  After 24 hours, the chance of having a heart attack starts to decrease. Your breath, hair, and body stop smelling like smoke.  After 48 hours, damaged nerve endings begin to recover. Your sense of taste and smell improve.  After 72 hours, the body is virtually free of nicotine. Your bronchial tubes relax and breathing becomes easier.  After 2 to 12 weeks, lungs can hold more air. Exercise becomes easier and circulation improves.  The risk of having a heart attack, stroke, cancer, or lung disease is greatly reduced.  After 1 year, the risk of coronary heart disease is cut in half.  After 5 years, the risk of stroke falls to the same as a nonsmoker.  After 10 years, the risk of lung cancer is cut in half and the risk of other cancers decreases significantly.  After 15 years, the risk of coronary heart disease drops, usually to the level of a nonsmoker.  If you are pregnant, quitting smoking will improve your chances of having a healthy baby.  The people you live with, especially any children, will be healthier.  You will have extra money to spend on things other than cigarettes. QUESTIONS TO THINK ABOUT BEFORE ATTEMPTING TO QUIT You may want to talk about your answers with your health care provider.  Why do you want to quit?  If you tried to quit in the past, what helped and what did not?  What will be the most difficult situations for you after you quit? How will you plan to handle them?  Who can help you through the tough times? Your family? Friends? A health care provider?  What pleasures do you get from smoking? What ways can you still get pleasure if you quit? Here are some questions to ask your health care provider:  How can you help me to be successful at quitting?  What medicine do you think would be best for me and how should I take it?  What should I  do if I need more help?  What is smoking withdrawal like? How can I get information on withdrawal? GET READY  Set a quit date.  Change your environment by getting rid of all cigarettes, ashtrays, matches, and lighters in your home, car, or work. Do not let people smoke in your home.  Review your past attempts to quit. Think about what worked and what did not. GET SUPPORT AND ENCOURAGEMENT You have a better chance of being successful if you have help. You can get support in many ways.  Tell your family, friends, and coworkers that you are going to quit and need their support. Ask them not to smoke around you.  Get individual, group, or telephone counseling and support. Programs are available at [employer] and health centers. Call your local health department for information about programs in your area.  Spiritual beliefs and practices may help some smokers quit.  Download a "quit meter" on your computer to keep track of quit statistics, such as how long you have gone without smoking,  cigarettes not smoked, and money saved.  Get a self-help book about quitting smoking and staying off tobacco. LEARN NEW SKILLS AND BEHAVIORS  Distract yourself from urges to smoke. Talk to someone, go for a walk, or occupy your time with a task.  Change your normal routine. Take a different route to work. Drink tea instead of coffee. Eat breakfast in a different place.  Reduce your stress. Take a hot bath, exercise, or read a book.  Plan something enjoyable to do every day. Reward yourself for not smoking.  Explore interactive web-based programs that specialize in helping you quit. GET MEDICINE AND USE IT CORRECTLY Medicines can help you stop smoking and decrease the urge to smoke. Combining medicine with the above behavioral methods and support can greatly increase your chances of successfully quitting smoking.  Nicotine replacement therapy helps deliver nicotine to your body without the negative  effects and risks of smoking. Nicotine replacement therapy includes nicotine gum, lozenges, inhalers, nasal sprays, and skin patches. Some may be available over-the-counter and others require a prescription.  Antidepressant medicine helps people abstain from smoking, but how this works is unknown. This medicine is available by prescription.  Nicotinic receptor partial agonist medicine simulates the effect of nicotine in your brain. This medicine is available by prescription. Ask your health care provider for advice about which medicines to use and how to use them based on your health history. Your health care provider will tell you what side effects to look out for if you choose to be on a medicine or therapy. Carefully read the information on the package. Do not use any other product containing nicotine while using a nicotine replacement product.  RELAPSE OR DIFFICULT SITUATIONS Most relapses occur within the first 3 months after quitting. Do not be discouraged if you start smoking again. Remember, most people try several times before finally quitting. You may have symptoms of withdrawal because your body is used to nicotine. You may crave cigarettes, be irritable, feel very hungry, cough often, get headaches, or have difficulty concentrating. The withdrawal symptoms are only temporary. They are strongest when you first quit, but they will go away within 10-14 days. To reduce the chances of relapse, try to:  Avoid drinking alcohol. Drinking lowers your chances of successfully quitting.  Reduce the amount of caffeine you consume. Once you quit smoking, the amount of caffeine in your body increases and can give you symptoms, such as a rapid heartbeat, sweating, and anxiety.  Avoid smokers because they can make you want to smoke.  Do not let weight gain distract you. Many smokers will gain weight when they quit, usually less than 10 pounds. Eat a healthy diet and stay active. You can always lose the  weight gained after you quit.  Find ways to improve your mood other than smoking. FOR MORE INFORMATION  www.smokefree.gov  Document Released: 02/26/2001 Document Revised: 07/19/2013 Document Reviewed: 06/13/2011 [facility] Patient Information 2015 [place], [place]. This information is not intended to replace advice given to you by your health care provider. Make sure you discuss any questions you have with your health care provider.  You Can Quit Smoking If you are ready to quit smoking or are thinking about it, congratulations! You have chosen to help yourself be healthier and live longer! There are lots of different ways to quit smoking. Nicotine gum, nicotine patches, a nicotine inhaler, or nicotine nasal spray can help with physical craving. Hypnosis, support groups, and medicines help break the habit of smoking. TIPS  TO GET OFF AND STAY OFF CIGARETTES  Learn to predict your moods. Do not let a bad situation be your excuse to have a cigarette. Some situations in your life might tempt you to have a cigarette.  Ask friends and co-workers not to smoke around you.  Make your home smoke-free.  Never have "just one" cigarette. It leads to wanting another and another. Remind yourself of your decision to quit.  On a card, make a list of your reasons for not smoking. Read it at least the same number of times a day as you have a cigarette. Tell yourself everyday, "I do not want to smoke. I choose not to smoke."  Ask someone at home or work to help you with your plan to quit smoking.  Have something planned after you eat or have a cup of coffee. Take a walk or get other exercise to perk you up. This will help to keep you from overeating.  Try a relaxation exercise to calm you down and decrease your stress. Remember, you may be tense and nervous the first two weeks after you quit. This will pass.  Find new activities to keep your hands busy. Play with a pen, coin, or rubber band. Doodle or draw  things on paper.  Brush your teeth right after eating. This will help cut down the craving for the taste of tobacco after meals. You can try mouthwash too.  Try gum, breath mints, or diet candy to keep something in your mouth. IF YOU SMOKE AND WANT TO QUIT:  Do not stock up on cigarettes. Never buy a carton. Wait until one pack is finished before you buy another.  Never carry cigarettes with you at work or at home.  Keep cigarettes as far away from you as possible. Leave them with someone else.  Never carry matches or a lighter with you.  Ask yourself, "Do I need this cigarette or is this just a reflex?"  Bet with someone that you can quit. Put cigarette money in a piggy bank every morning. If you smoke, you give up the money. If you do not smoke, by the end of the week, you keep the money.  Keep trying. It takes 21 days to change a habit!  Talk to your doctor about using medicines to help you quit. These include nicotine replacement gum, lozenges, or skin patches. Document Released: 12/29/2008 Document Revised: 05/27/2011 Document Reviewed: 12/29/2008 [facility] Patient Information 2015 [place], [place]. This information is not intended to replace advice given to you by your health care provider. Make sure you discuss any questions you have with your health care provider.

## 2013-12-13 NOTE — Progress Notes (Signed)
ED Antimicrobial Stewardship Positive Culture Follow Up   Kimberly Contreras is an 28 y.o. female who presented to [facility] on 12/10/2013 with a chief complaint of  Chief Complaint  Patient presents with  . Urinary Frequency    Recent Results (from the past 720 hour(s))  URINE CULTURE     Status: None   Collection Time    12/10/13  2:24 AM      Result Value Ref Range Status   Specimen Description URINE, CLEAN CATCH   Final   Special Requests CX ADDED AT 0348 ON 147829   Final   Culture  Setup Time     Final   Value: 12/10/2013 09:28     Performed at [facility]   Colony Count     Final   Value: 50,000 COLONIES/ML     Performed at [facility]   Culture     Final   Value: GROUP B STREP(S.AGALACTIAE)ISOLATED     Note: TESTING AGAINST S. AGALACTIAE NOT ROUTINELY PERFORMED DUE TO PREDICTABILITY OF AMP/PEN/VAN SUSCEPTIBILITY.     Performed at [facility]   Report Status 12/12/2013 FINAL   Final     Patient discharged originally without antimicrobial agent and treatment is now indicated   New antibiotic prescription: Amoxicillin  PO TID x 7 days  ED Provider: Langston Masker [facility] 12/13/2013, 9:36 AM Infectious Diseases Pharmacist Phone# [phone]

## 2013-12-14 ENCOUNTER — Encounter (HOSPITAL_COMMUNITY): Payer: Self-pay | Admitting: Registered Nurse

## 2013-12-14 DIAGNOSIS — F331 Major depressive disorder, recurrent, moderate: Secondary | ICD-10-CM | POA: Diagnosis present

## 2013-12-14 LAB — POC URINE PREG, ED: Preg Test, Ur: NEGATIVE

## 2013-12-14 LAB — RAPID URINE DRUG SCREEN, HOSP PERFORMED
Amphetamines: NOT DETECTED
Barbiturates: NOT DETECTED
Benzodiazepines: NOT DETECTED
COCAINE: NOT DETECTED
OPIATES: NOT DETECTED
Tetrahydrocannabinol: POSITIVE — AB

## 2013-12-14 LAB — CK: CK TOTAL: 99 U/L (ref 7–177)

## 2013-12-14 MED ORDER — LORAZEPAM 1 MG PO TABS: 1.0000 mg | ORAL_TABLET | Freq: Three times a day (TID) | ORAL | Status: DC | PRN

## 2013-12-14 MED ORDER — BUPROPION HCL ER (SR) 100 MG PO TB12
100.0000 mg | ORAL_TABLET | Freq: Every day | ORAL | Status: DC
  Administered 2013-12-14: 100 mg via ORAL
  Filled 2013-12-14: qty 1

## 2013-12-14 MED ORDER — IBUPROFEN 200 MG PO TABS: 600.0000 mg | ORAL_TABLET | Freq: Three times a day (TID) | ORAL | Status: DC | PRN

## 2013-12-14 MED ORDER — ZOLPIDEM TARTRATE 5 MG PO TABS: 5.0000 mg | ORAL_TABLET | Freq: Every evening | ORAL | Status: DC | PRN

## 2013-12-14 MED ORDER — ALUM & MAG HYDROXIDE-SIMETH 200-200-20 MG/5ML PO SUSP: 30.0000 mL | ORAL | Status: DC | PRN

## 2013-12-14 MED ORDER — ACETAMINOPHEN 325 MG PO TABS: 650.0000 mg | ORAL_TABLET | ORAL | Status: DC | PRN

## 2013-12-14 MED ORDER — ONDANSETRON HCL 4 MG PO TABS: 4.0000 mg | ORAL_TABLET | Freq: Three times a day (TID) | ORAL | Status: DC | PRN

## 2013-12-14 MED ORDER — NICOTINE 21 MG/24HR TD PT24: 21.0000 mg | MEDICATED_PATCH | Freq: Every day | TRANSDERMAL | Status: DC

## 2013-12-14 NOTE — Discharge Instructions (Signed)
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
## 2013-12-14 NOTE — Consult Note (Signed)
Face to face evaluation and I agree with this note 
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
## 2013-12-14 NOTE — ED Notes (Signed)
Pt is teary eyed, c/o depression and anxiety; is seen at [facility]
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
## 2013-12-15 NOTE — ED Provider Notes (Signed)
Medical screening examination/treatment/procedure(s) were performed by non-physician practitioner and as supervising physician I was immediately available for consultation/collaboration.   EKG Interpretation None        Matthew Gentry, MD 12/15/13 1130 

## 2013-12-16 ENCOUNTER — Emergency Department (HOSPITAL_COMMUNITY): Payer: Self-pay

## 2013-12-16 ENCOUNTER — Emergency Department (HOSPITAL_COMMUNITY)
Admission: EM | Admit: 2013-12-16 | Discharge: 2013-12-16 | Disposition: A | Discharge status: Home / Self Care | Payer: Self-pay | Attending: Emergency Medicine | Admitting: Emergency Medicine

## 2013-12-16 ENCOUNTER — Encounter (HOSPITAL_COMMUNITY): Payer: Self-pay | Admitting: Emergency Medicine

## 2013-12-16 DIAGNOSIS — E669 Obesity, unspecified: Secondary | ICD-10-CM | POA: Insufficient documentation

## 2013-12-16 DIAGNOSIS — Z8659 Personal history of other mental and behavioral disorders: Secondary | ICD-10-CM | POA: Insufficient documentation

## 2013-12-16 DIAGNOSIS — Z79899 Other long term (current) drug therapy: Secondary | ICD-10-CM | POA: Insufficient documentation

## 2013-12-16 DIAGNOSIS — M79672 Pain in left foot: Secondary | ICD-10-CM | POA: Insufficient documentation

## 2013-12-16 DIAGNOSIS — M25474 Effusion, right foot: Secondary | ICD-10-CM | POA: Insufficient documentation

## 2013-12-16 DIAGNOSIS — M25475 Effusion, left foot: Secondary | ICD-10-CM | POA: Insufficient documentation

## 2013-12-16 DIAGNOSIS — Z720 Tobacco use: Secondary | ICD-10-CM | POA: Insufficient documentation

## 2013-12-16 DIAGNOSIS — Z872 Personal history of diseases of the skin and subcutaneous tissue: Secondary | ICD-10-CM | POA: Insufficient documentation

## 2013-12-16 NOTE — ED Notes (Signed)
Patient reports "cramping in foot" when ambulating and "toes locking up" and "foot swelling". Patient denies pain when non ambulatory, pain 8/10 when ambulating. Onset of symptoms 1500 yesterday. Minimal swelling noted at this time. Not warm to touch, pain non radiating, pedal pulses intact.

## 2013-12-16 NOTE — Discharge Instructions (Signed)
°Emergency Department Resource Guide °1) Find a Doctor and Pay Out of Pocket °Although you won't have to find out who is covered by your insurance plan, it is a good idea to ask around and get recommendations. You will then need to call the office and see if the doctor you have chosen will accept you as a new patient and what types of options they offer for patients who are self-pay. Some doctors offer discounts or will set up payment plans for their patients who do not have insurance, but you will need to ask so you aren't surprised when you get to your appointment. ° °2) Contact Your Local Health Department °Not all health departments have doctors that can see patients for sick visits, but many do, so it is worth a call to see if yours does. If you don't know where your local health department is, you can check in your phone book. The CDC also has a tool to help you locate your state's health department, and many state websites also have listings of all of their local health departments. ° °3) Find a Walk-in Clinic °If your illness is not likely to be very severe or complicated, you may want to try a walk in clinic. These are popping up all over the country in pharmacies, drugstores, and shopping centers. They're usually staffed by nurse practitioners or physician assistants that have been trained to treat common illnesses and complaints. They're usually fairly quick and inexpensive. However, if you have serious medical issues or chronic medical problems, these are probably not your best option. ° °No Primary Care Doctor: °- Call Health Connect at  832-8000 - they can help you locate a primary care doctor that  accepts your insurance, provides certain services, etc. °- Physician Referral Service- 1-800-533-3463 ° °Chronic Pain Problems: °Organization         Address  Phone   Notes  °[facility]  (336) 297-2271 Patients need to be referred by their primary care doctor.  ° °Medication  Assistance: °Organization         Address  Phone   Notes  °[place] [facility] [address] °[place], [place] [address] (336) 641-8030 --Must be a resident of [place] °-- Must have NO insurance coverage whatsoever (no Medicaid/ Medicare, etc.) °-- The pt. MUST have a primary care doctor that directs their care regularly and follows them in the community °  °[facility]  (866) 331-1348   °[employer]  (888) 892-1162   ° °Agencies that provide inexpensive medical care: °Organization         Address  Phone   Notes  °[facility]  (336) 832-8035   °[facility]    (336) 832-7272   °[facility] [address] °[place], [place] [address] (336) 832-4777   °[facility] [address], °[place] (336) 271-4999   °[facility]    (336) 373-0678   °[facility]    (336) 272-1050   °[facility] ° [address], [place] Phone:  (336) 832-4444, Fax:  (336) 832-4440 Hours of Operation:  9 am - 6 pm, M-F.  Also accepts Medicaid/Medicare and self-pay.  °[facility] ° [address],  Phone: (336) 832-3150, Fax: (336) [phone]. Hours of Operation:  8:30 am - 5:30 pm, M-F.  Also accepts Medicaid and self-pay.  °[facility] [place] [address], [place] Phone: (336) 878-6027   °[facility] [address], [place], [place] (336)723-1848, Ext. 123 Mondays & Thursdays: 7-9 AM.  First 15 patients are seen on a first come, first serve basis. °  ° °Medicaid-accepting [place] Providers: ° °Organization         Address  Phone   Notes  °[facility] [address], [place] (336) 641-2100 Also accepts self-pay patients.  °[facility] [address], [place] ° (336) 856-9996   °[facility] [address], [place]  (336) 288-8857   °[facility] [address] [place] [address], [place] (336) 299-7000   °Veita Bland [address], [place]  ° (336) 373-1557 Only accepts [place] Access Medicaid patients after they have their name applied to their card.  ° °Self-Pay (no insurance) in [place]: ° °Organization         Address  Phone   Notes  °Sickle Cell Patients, [facility] [address], [place] (336) 832-1970   °[facility] [address], [place] (336) 832-4400   °[facility] ° [address], [place] (336) 992-4800   °[facility]/Dr. Osei-Bonsu ° [address] [place] [address], [place] or [address], [place] (336) [phone] Phone number for both [place] and [place] locations is the same.  °[facility] [address], [place] (336) 299-0000   °[facility] [address] [place] [address], [place] or [address] (336) 852-7530 °(336) 878-2260   °[facility] [address], [place] (336) 350-1642, phone; (336) 294-5005, fax Sees patients 1st and 3rd Saturday of every month.  Must not qualify for public or private insurance (i.e. Medicaid, Medicare, [place] Health Choice, Veterans' Benefits) • Household income should be no more than 200% of the poverty level •The clinic cannot treat you if you are pregnant or think you are pregnant • Sexually transmitted diseases are not treated at the clinic.  ° ° °Dental Care: °Organization         Address  Phone  Notes  °[place] [facility] [address], [place] (336) 641-6152 Accepts children up to age 21 who are enrolled in Medicaid or [place] Health Choice; pregnant women with a Medicaid card; and children who have applied for Medicaid or [place] Health Choice, but were declined, whose parents can pay a reduced fee at time of service.  °[place]  [facility] [place]  [address], [place] (336) 641-7733 Accepts children up to age 21 who are enrolled in Medicaid or [place] Health Choice; pregnant women with a Medicaid card; and children who have applied for Medicaid or [place] Health Choice, but were declined, whose parents can pay a reduced fee at time of service.  °[facility] ° [address], [place] (336) 641-4533 Patients are seen by appointment only. Walk-ins are not accepted. [facility] will see patients 18 years of age and older. °Monday - Tuesday (8am-5pm) °Most Wednesdays (8:30-5pm) °$30 per visit, cash only  °[facility] ° [address], [place] (336) 641-4533 Patients are seen by appointment only. Walk-ins are not accepted. [facility] will see patients 18 years of age and older. °One   Wednesday Evening (Monthly: Volunteer Based).  $30 per visit, cash only  °[facility]  (919) 537-3737 for adults; Children under age 4, call Graduate Pediatric Dentistry at (919) [phone]. Children aged 4-14, please call (919) 537-3737 to request a pediatric application. ° Dental services are provided in all areas of dental care including fillings, crowns and bridges, complete and partial dentures, implants, gum treatment, root canals, and extractions. Preventive care is also provided. Treatment is provided to both adults and children. °Patients are selected via a lottery and there is often a waiting list. °  °[facility] [address], °[place] ° (336) 763-8833 [email or web address] °  °[facility] [address], [place], [place] (336)723-1848, Ext. 123 Second and Fourth Thursday of each month, opens at 6:30 AM; Clinic ends at 9 AM.  Patients are seen on a first-come first-served basis, and a limited number are seen during each clinic.  ° °[facility] ° [address], [place], [place] (336) 723-7904    Eligibility Requirements °You must have lived in [place], [place], or [place] counties for at least the last three months. °  You cannot be eligible for state or federal sponsored healthcare insurance, including Veterans Administration, Medicaid, or Medicare. °  You generally cannot be eligible for healthcare insurance through your employer.  °  How to apply: °Eligibility screenings are held every Tuesday and Wednesday afternoon from 1:00 pm until 4:00 pm. You do not need an appointment for the interview!  °[facility] [address], [place], [place] 336-631-2330   °[place] [facility]  336-342-8273   °[facility]  336-703-3100   °[facility]  336-570-6415   ° °Behavioral Health Resources in the Community: °Intensive Outpatient Programs °Organization         Address  Phone  Notes  °[facility] [address], [place], [place] 336-878-6098   °[facility] [address], [place], [place] 336-832-9800   °[facility] [address], [place], [place] ° 336-882-2125   °[facility] [address],  °[place], [place] 1-800-853-5163 or 336-641-4981   °Substance Abuse Resources °Organization         Address  Phone  Notes  °[facility]  336-882-2125   °[facility]  336-784-9470   °[facility]  336-285-9073   °[facility]  336-845-3988   °Residential & Outpatient Substance Abuse Program  1-800-659-3381   °Psychological Services °Organization         Address  Phone  Notes  °[facility]  336- 832-9600   °[facility]  336- 378-7881   °[facility] [address], [place] 1-800-853-5163 or 336-641-4981   ° °Mobile Crisis Teams °Organization         Address  Phone  Notes  °[facility], Mobile Crisis Care Unit  1-877-626-1772   °Assertive °[facility] ° [address]  [place], [place] 336-834-9664   °Sharon DeEsch [address] °[place] 336-554-5454   ° °Self-Help/Support Groups °Organization         Address  Phone             Notes  °[facility] - variety of support groups  336- 373-1402 Call for more information  °Narcotics Anonymous (NA), [facility] [address], °[place]  2 meetings at this location  ° °  Residential Treatment Programs °Organization         Address  Phone  Notes  °[facility] [address],    °[place]  1-866-801-8205   °[facility] ° [address], [place], [place] 704-293-8524   °[facility] [address], [place] 336-845-3988 Admissions: 8am-3pm M-F  °[facility] [address]    °[place], [place] 336-841-1104   °[facility] [address], [place], [place] 336-379-7146   °[facility] [address]  °[place], [place] 336-285-9073   °[facility] - Intensive Outpatient [address], [place], [place] 336-852-3033   °[facility] [address]  °[place], [place] 1-877-615-2722 or 336-784-9470   °[facility] [address] [place], [place] 336-227-7417 Accepts Medicaid  °[facility] [address]  ° [place] 1-800-659-3381 Substance Abuse/Addiction Treatment  ° °[place] Behavioral Health Resources °Organization         Address  Phone  Notes  °[facility]  (888) 581-9988   °Julie Brannon, PhD [address] [place], [place]   (336) 349-5553 or (336) 951-0000   °[facility]   [address] °[place], [place] (336) 349-4454   °[facility] [address], [place], [place] (336) 342-8316 Insurance/Medicaid/sponsorship through [facility]  °[facility] [address]                                    [place], [place] (336) 342-8316 Therapy/tele-psych/case    °[facility] [address]  ° [place], [place] (336) 349-2233    °Dr. Arfeen  (336) 349-4544   °[facility] [place]  [employer] [place] [facility] 1) [address], [place] °2) [address], [place] °3)  [address], [place] (336) 349-3220 °(336) 342-7768 ° °(336) 342-8140   °[place] Child Abuse [facility] (336) 342-1394 or (336) 342-3537 (After Hours)    ° ° °

## 2013-12-16 NOTE — ED Notes (Signed)
Pt complains of left foot pain all day today, foot appears swollen

## 2013-12-16 NOTE — ED Provider Notes (Signed)
CSN: [id number]     Arrival date & time 12/16/13  0029 History   First MD Initiated Contact with Patient 12/16/13 0103     Chief Complaint  Patient presents with  . Foot Pain    (Consider location/radiation/quality/duration/timing/severity/associated sxs/prior Treatment) HPI Comments: Patient is a 28 year old female with history of abscess, depression, obesity, anxiety presents the emergency department today for evaluation of left foot pain. She reports that this pain has been ongoing for the past week and half. The patient was evaluated for this on 9/29 by myself. The pain has persisted. It is intermittent, cramping pain. At that time electrolytes and CK were unremarkable. She reports increasing her water intake. Her feet appear pink and she reports that she spent time out in the sun. She is not currently experiencing any pain. Denies chest pain, shortness of breath.  Patient is a 28 y.o. female presenting with lower extremity pain. The history is provided by the patient. No language interpreter was used.  Foot Pain Associated symptoms include arthralgias and joint swelling. Pertinent negatives include no abdominal pain, chest pain, chills, fever, nausea or vomiting.    Past Medical History  Diagnosis Date  . Abscess   . Depression   . Obesity   . Anxiety    Past Surgical History  Procedure Laterality Date  . Appendectomy     Family History  Problem Relation Age of Onset  . Cancer Other   . Hypertension Other   . Diabetes Other    History  Substance Use Topics  . Smoking status: Current Every Day Smoker -- 1.00 packs/day    Types: Cigarettes  . Smokeless tobacco: Never Used  . Alcohol Use: 1.2 oz/week    2 Shots of liquor per week     Comment: on weekends   OB History   Grav Para Term Preterm Abortions TAB SAB Ect Mult Living                 Review of Systems  Constitutional: Negative for fever and chills.  Respiratory: Negative for shortness of breath.    Cardiovascular: Negative for chest pain.  Gastrointestinal: Negative for nausea, vomiting and abdominal pain.  Musculoskeletal: Positive for arthralgias and joint swelling.  All other systems reviewed and are negative.     Allergies  Review of patient's allergies indicates no known allergies.  Home Medications   Prior to Admission medications   Medication Sig Start Date End Date Taking? Authorizing Provider  albuterol (PROVENTIL HFA;VENTOLIN HFA) 108 (90 BASE) MCG/ACT inhaler Inhale 1 puff into the lungs every 6 (six) hours as needed for wheezing or shortness of breath.   Yes Historical Provider, MD  benzonatate (TESSALON) 100 MG capsule Take 1 capsule (100 mg total) by mouth every 8 (eight) hours. 12/13/13   Olivia Mackielga M Otter, MD  fluconazole (DIFLUCAN) 150 MG tablet Take 150 mg by mouth daily.    Historical Provider, MD  guaiFENesin (MUCINEX) 600 MG 12 hr tablet Take 2 tablets (1,200 mg total) by mouth 2 (two) times daily. 12/13/13   Olivia Mackielga M Otter, MD  phenazopyridine (PYRIDIUM) 200 MG tablet Take 1 tablet (200 mg total) by mouth 3 (three) times daily. 12/10/13   Enid SkeensJoshua M Zavitz, MD   BP 124/99  Pulse 72  Temp(Src) 98 F (36.7 C) (Oral)  Resp 14  SpO2 99%  LMP 12/08/2013 Physical Exam  Nursing note and vitals reviewed. Constitutional: She is oriented to person, place, and time. She appears well-developed and well-nourished. No distress.  Obese  HENT:  Head: Normocephalic and atraumatic.  Right Ear: External ear normal.  Left Ear: External ear normal.  Nose: Nose normal.  Mouth/Throat: Oropharynx is clear and moist.  Eyes: Conjunctivae are normal.  Neck: Normal range of motion.  Cardiovascular: Normal rate, regular rhythm and normal heart sounds.   Pulmonary/Chest: Effort normal and breath sounds normal. No stridor. No respiratory distress. She has no wheezes. She has no rales.  Abdominal: Soft. She exhibits no distension.  Musculoskeletal: Normal range of motion.  No tenderness  to palpation over left foot. Swelling to bilateral feet. Flip flop tan line.   Neurological: She is alert and oriented to person, place, and time. She has normal strength.  Skin: Skin is warm and dry. She is not diaphoretic. No erythema.  Psychiatric: She has a normal mood and affect. Her behavior is normal.    ED Course  Procedures (including critical care time) Labs Review Labs Reviewed - No data to display  Imaging Review Dg Foot Complete Left  12/16/2013   CLINICAL DATA:  Foot pain/swelling  EXAM: LEFT FOOT - COMPLETE 3+ VIEW  COMPARISON:  None.  FINDINGS: No fracture or dislocation is seen.  The joint spaces are preserved.  Moderate soft tissue swelling of the forefoot.  IMPRESSION: Moderate soft tissue swelling of the forefoot.  No acute osseous abnormality is seen.   Electronically Signed   By: Charline Bills M.D.   On: 12/16/2013 01:49     EKG Interpretation None      MDM   Final diagnoses:  Left foot pain    Patient presents emergency department for evaluation of intermittent left foot cramping. Patient was evaluated for same symptoms on 9/29 by myself. At that time labs were done which were unremarkable. Today I ordered x-ray. X-ray is unremarkable. Patient is asymptomatic at this time. She was given and wellness followup as well as a Facilities manager. Discussed reasons to return to emergency Department immediately. Vital signs stable for discharge. Discussed case with Dr. Rhunette Croft who agrees with plan. Patient / Family / Caregiver informed of clinical course, understand medical decision-making process, and agree with plan.    Mora Bellman, PA-C 12/16/13 [phone]

## 2013-12-17 NOTE — ED Provider Notes (Signed)
Medical screening examination/treatment/procedure(s) were performed by non-physician practitioner and as supervising physician I was immediately available for consultation/collaboration.   EKG Interpretation None      Pt w/ foot pain - over the course of few days. Immunocompetent. Advised PA-C to get Xray, r/o free air. Clinically per PA-C there is no signs of infection. Advised outpatient f/u, as if she doesn't improve, she might need specialist or MRI.  Derwood KaplanAnkit Glendell Fouse, MD 12/17/13 [phone]

## 2014-01-17 ENCOUNTER — Telehealth (HOSPITAL_COMMUNITY): Payer: Self-pay

## 2014-01-17 NOTE — ED Notes (Signed)
Unable to contact pt by mail or telephone. Unable to communicate lab results or treatment changes. 

## 2014-02-09 ENCOUNTER — Encounter (HOSPITAL_COMMUNITY): Payer: Self-pay | Admitting: Emergency Medicine

## 2014-02-09 ENCOUNTER — Emergency Department (HOSPITAL_COMMUNITY)
Admission: EM | Admit: 2014-02-09 | Discharge: 2014-02-10 | Disposition: A | Discharge status: Other Acute Inpatient Hospital | Payer: Self-pay | Attending: Emergency Medicine | Admitting: Emergency Medicine

## 2014-02-09 DIAGNOSIS — F411 Generalized anxiety disorder: Secondary | ICD-10-CM | POA: Diagnosis present

## 2014-02-09 DIAGNOSIS — R45851 Suicidal ideations: Secondary | ICD-10-CM

## 2014-02-09 DIAGNOSIS — Z720 Tobacco use: Secondary | ICD-10-CM | POA: Insufficient documentation

## 2014-02-09 DIAGNOSIS — F332 Major depressive disorder, recurrent severe without psychotic features: Secondary | ICD-10-CM | POA: Diagnosis present

## 2014-02-09 DIAGNOSIS — E669 Obesity, unspecified: Secondary | ICD-10-CM | POA: Insufficient documentation

## 2014-02-09 DIAGNOSIS — F331 Major depressive disorder, recurrent, moderate: Secondary | ICD-10-CM | POA: Diagnosis present

## 2014-02-09 DIAGNOSIS — R443 Hallucinations, unspecified: Secondary | ICD-10-CM | POA: Insufficient documentation

## 2014-02-09 DIAGNOSIS — Z79899 Other long term (current) drug therapy: Secondary | ICD-10-CM | POA: Insufficient documentation

## 2014-02-09 LAB — CBC
HCT: 42.9 % (ref 36.0–46.0)
Hemoglobin: 14.1 g/dL (ref 12.0–15.0)
MCH: 28.4 pg (ref 26.0–34.0)
MCHC: 32.9 g/dL (ref 30.0–36.0)
MCV: 86.5 fL (ref 78.0–100.0)
PLATELETS: 316 10*3/uL (ref 150–400)
RBC: 4.96 MIL/uL (ref 3.87–5.11)
RDW: 14.3 % (ref 11.5–15.5)
WBC: 10.5 10*3/uL (ref 4.0–10.5)

## 2014-02-09 LAB — RAPID URINE DRUG SCREEN, HOSP PERFORMED
AMPHETAMINES: NOT DETECTED
Barbiturates: NOT DETECTED
Benzodiazepines: NOT DETECTED
COCAINE: NOT DETECTED
OPIATES: NOT DETECTED
Tetrahydrocannabinol: POSITIVE — AB

## 2014-02-09 MED ORDER — ACETAMINOPHEN 325 MG PO TABS: 650.0000 mg | ORAL_TABLET | ORAL | Status: DC | PRN

## 2014-02-09 MED ORDER — ZOLPIDEM TARTRATE 5 MG PO TABS
5.0000 mg | ORAL_TABLET | Freq: Every evening | ORAL | Status: DC | PRN
  Administered 2014-02-10: 5 mg via ORAL
  Filled 2014-02-09: qty 1

## 2014-02-09 MED ORDER — ALBUTEROL SULFATE HFA 108 (90 BASE) MCG/ACT IN AERS: 1.0000 | INHALATION_SPRAY | Freq: Four times a day (QID) | RESPIRATORY_TRACT | Status: DC | PRN

## 2014-02-09 MED ORDER — ONDANSETRON HCL 4 MG PO TABS: 4.0000 mg | ORAL_TABLET | Freq: Three times a day (TID) | ORAL | Status: DC | PRN

## 2014-02-09 MED ORDER — LORAZEPAM 1 MG PO TABS
1.0000 mg | ORAL_TABLET | Freq: Three times a day (TID) | ORAL | Status: DC | PRN
  Administered 2014-02-10: 1 mg via ORAL
  Filled 2014-02-09: qty 1

## 2014-02-09 MED ORDER — GUAIFENESIN ER 600 MG PO TB12
1200.0000 mg | ORAL_TABLET | Freq: Two times a day (BID) | ORAL | Status: DC
  Administered 2014-02-10: 1200 mg via ORAL
  Filled 2014-02-09 (×4): qty 2

## 2014-02-09 MED ORDER — ALUM & MAG HYDROXIDE-SIMETH 200-200-20 MG/5ML PO SUSP: 30.0000 mL | ORAL | Status: DC | PRN

## 2014-02-09 MED ORDER — IBUPROFEN 200 MG PO TABS: 600.0000 mg | ORAL_TABLET | Freq: Three times a day (TID) | ORAL | Status: DC | PRN

## 2014-02-09 NOTE — ED Provider Notes (Signed)
CSN: [id number]     Arrival date & time 02/09/14  2248 History   First MD Initiated Contact with Patient 02/09/14 2317     Chief Complaint  Patient presents with  . Suicidal  . Homicidal  . Hallucinations     (Consider location/radiation/quality/duration/timing/severity/associated sxs/prior Treatment) Patient is a 28 y.o. female presenting with mental health disorder. The history is provided by the patient.  Mental Health Problem Presenting symptoms: hallucinations   Degree of incapacity (severity):  Moderate Onset quality:  Sudden Duration:  1 week Timing:  Constant Progression:  Worsening Chronicity:  New Context: not alcohol use, not drug abuse, not noncompliant and not stressful life event   Treatment compliance:  Untreated Relieved by:  Nothing Worsened by:  Nothing tried   Past Medical History  Diagnosis Date  . Abscess   . Depression   . Obesity   . Anxiety    Past Surgical History  Procedure Laterality Date  . Appendectomy     Family History  Problem Relation Age of Onset  . Cancer Other   . Hypertension Other   . Diabetes Other    History  Substance Use Topics  . Smoking status: Current Every Day Smoker -- 1.00 packs/day    Types: Cigarettes  . Smokeless tobacco: Never Used  . Alcohol Use: No   OB History    No data available     Review of Systems  Constitutional: Negative for fever.  Respiratory: Negative for cough and shortness of breath.   Psychiatric/Behavioral: Positive for hallucinations.  All other systems reviewed and are negative.     Allergies  Review of patient's allergies indicates no known allergies.  Home Medications   Prior to Admission medications   Medication Sig Start Date End Date Taking? Authorizing Provider  acetaminophen (TYLENOL) 500 MG tablet Take 1,000 mg by mouth every 6 (six) hours as needed for mild pain.   Yes Historical Provider, MD  albuterol (PROVENTIL HFA;VENTOLIN HFA) 108 (90 BASE) MCG/ACT inhaler  Inhale 1 puff into the lungs every 6 (six) hours as needed for wheezing or shortness of breath.    Historical Provider, MD  benzonatate (TESSALON) 100 MG capsule Take 1 capsule (100 mg total) by mouth every 8 (eight) hours. Patient not taking: Reported on 02/09/2014 12/13/13   Olivia Mackielga M Otter, MD  guaiFENesin (MUCINEX) 600 MG 12 hr tablet Take 2 tablets (1,200 mg total) by mouth 2 (two) times daily. Patient not taking: Reported on 02/09/2014 12/13/13   Olivia Mackielga M Otter, MD  phenazopyridine (PYRIDIUM) 200 MG tablet Take 1 tablet (200 mg total) by mouth 3 (three) times daily. Patient not taking: Reported on 02/09/2014 12/10/13   Enid SkeensJoshua M Zavitz, MD   BP 142/72 mmHg  Pulse 74  Temp(Src) 98.1 F (36.7 C) (Oral)  Resp 16  SpO2 97%  LMP 02/03/2014 (Approximate) Physical Exam  Constitutional: She is oriented to person, place, and time. She appears well-developed and well-nourished. No distress.  HENT:  Head: Normocephalic and atraumatic.  Mouth/Throat: Oropharynx is clear and moist.  Eyes: EOM are normal. Pupils are equal, round, and reactive to light.  Neck: Normal range of motion. Neck supple.  Cardiovascular: Normal rate and regular rhythm.  Exam reveals no friction rub.   No murmur heard. Pulmonary/Chest: Effort normal and breath sounds normal. No respiratory distress. She has no wheezes. She has no rales.  Abdominal: Soft. She exhibits no distension. There is no tenderness. There is no rebound.  Musculoskeletal: Normal range of motion. She exhibits  no edema.  Neurological: She is alert and oriented to person, place, and time.  Skin: No rash noted. She is not diaphoretic.  Psychiatric: Her mood appears not anxious. Her affect is blunt. She is slowed. She is not agitated and not aggressive. She exhibits a depressed mood.  Nursing note and vitals reviewed.   ED Course  Procedures (including critical care time) Labs Review Labs Reviewed  ACETAMINOPHEN LEVEL  CBC  COMPREHENSIVE METABOLIC PANEL   ETHANOL  SALICYLATE LEVEL  URINE RAPID DRUG SCREEN (HOSP PERFORMED)    Imaging Review No results found.   EKG Interpretation None      MDM   Final diagnoses:  Hallucinations    28 rolled female here with psychologic complaints.he is hearing . Boyfriend kicked her out because she felt like she needed some help. She is homeless now. She denies history of suicide attempt, but does have history of depression. Voices they began about a week ago. Voices are telling her to hurt herself or hurt others. They're not given her any specific plan. This all started she is living with her boyfriend. Consult TTS.    Elwin MochaBlair Kayonna Lawniczak, MD 02/10/14 [phone]

## 2014-02-09 NOTE — ED Notes (Signed)
Pt. Changed into scrubs. Pt and belongings searched and wanded by security. Pt. Has 1 belongings bag. Pt. Has black and orange tennis shoes, 2 earrings, cell phone and black charger, blue jeans red shirt, gray hooded sweater. Pt. Belongings locked up at the nurses station in triage behind the nurses desk.

## 2014-02-09 NOTE — ED Notes (Signed)
Pt states she came in tonight because she has had thoughts of hurting others, hurting herself and is hearing voices  Pt states she has not had this before  Pt states it started about a week ago

## 2014-02-10 ENCOUNTER — Encounter (HOSPITAL_COMMUNITY): Payer: Self-pay | Admitting: *Deleted

## 2014-02-10 ENCOUNTER — Encounter (HOSPITAL_COMMUNITY): Payer: Self-pay | Admitting: Psychiatry

## 2014-02-10 ENCOUNTER — Inpatient Hospital Stay (HOSPITAL_COMMUNITY)
Admission: AD | Admit: 2014-02-10 | Discharge: 2014-02-14 | DRG: 885 | Disposition: A | Discharge status: Home / Self Care | Payer: Federal, State, Local not specified - Other | Source: Intra-hospital | Attending: Psychiatry | Admitting: Psychiatry

## 2014-02-10 DIAGNOSIS — F323 Major depressive disorder, single episode, severe with psychotic features: Secondary | ICD-10-CM | POA: Diagnosis present

## 2014-02-10 DIAGNOSIS — Z6841 Body Mass Index (BMI) 40.0 and over, adult: Secondary | ICD-10-CM

## 2014-02-10 DIAGNOSIS — F29 Unspecified psychosis not due to a substance or known physiological condition: Secondary | ICD-10-CM

## 2014-02-10 DIAGNOSIS — F17210 Nicotine dependence, cigarettes, uncomplicated: Secondary | ICD-10-CM | POA: Diagnosis present

## 2014-02-10 DIAGNOSIS — F419 Anxiety disorder, unspecified: Secondary | ICD-10-CM | POA: Diagnosis present

## 2014-02-10 DIAGNOSIS — F319 Bipolar disorder, unspecified: Secondary | ICD-10-CM | POA: Diagnosis present

## 2014-02-10 DIAGNOSIS — Z8249 Family history of ischemic heart disease and other diseases of the circulatory system: Secondary | ICD-10-CM | POA: Diagnosis not present

## 2014-02-10 DIAGNOSIS — J449 Chronic obstructive pulmonary disease, unspecified: Secondary | ICD-10-CM | POA: Diagnosis present

## 2014-02-10 DIAGNOSIS — F332 Major depressive disorder, recurrent severe without psychotic features: Secondary | ICD-10-CM

## 2014-02-10 DIAGNOSIS — Z833 Family history of diabetes mellitus: Secondary | ICD-10-CM | POA: Diagnosis not present

## 2014-02-10 DIAGNOSIS — E669 Obesity, unspecified: Secondary | ICD-10-CM | POA: Diagnosis present

## 2014-02-10 DIAGNOSIS — R45851 Suicidal ideations: Secondary | ICD-10-CM | POA: Diagnosis present

## 2014-02-10 DIAGNOSIS — G4700 Insomnia, unspecified: Secondary | ICD-10-CM | POA: Diagnosis present

## 2014-02-10 DIAGNOSIS — F333 Major depressive disorder, recurrent, severe with psychotic symptoms: Secondary | ICD-10-CM | POA: Insufficient documentation

## 2014-02-10 DIAGNOSIS — Z599 Problem related to housing and economic circumstances, unspecified: Secondary | ICD-10-CM

## 2014-02-10 LAB — ACETAMINOPHEN LEVEL: Acetaminophen (Tylenol), Serum: <15 ug/mL (ref 10–30)

## 2014-02-10 LAB — COMPREHENSIVE METABOLIC PANEL
ALBUMIN: 3.3 g/dL — AB (ref 3.5–5.2)
ALT: 11 U/L (ref 0–35)
AST: 20 U/L (ref 0–37)
Alkaline Phosphatase: 62 U/L (ref 39–117)
Anion gap: 13 (ref 5–15)
BUN: 9 mg/dL (ref 6–23)
CALCIUM: 9.2 mg/dL (ref 8.4–10.5)
CO2: 24 meq/L (ref 19–32)
CREATININE: 0.73 mg/dL (ref 0.50–1.10)
Chloride: 104 mEq/L (ref 96–112)
GFR calc Af Amer: >90 mL/min (ref >90)
Glucose, Bld: 84 mg/dL (ref 70–99)
Potassium: 4.5 mEq/L (ref 3.7–5.3)
SODIUM: 141 meq/L (ref 137–147)
TOTAL PROTEIN: 6.7 g/dL (ref 6.0–8.3)
Total Bilirubin: <0.2 mg/dL — ABNORMAL LOW (ref 0.3–1.2)

## 2014-02-10 LAB — SALICYLATE LEVEL: Salicylate Lvl: <2 mg/dL — ABNORMAL LOW (ref 2.8–20.0)

## 2014-02-10 LAB — ETHANOL

## 2014-02-10 MED ORDER — RISPERIDONE 1 MG PO TABS
1.0000 mg | ORAL_TABLET | Freq: Two times a day (BID) | ORAL | Status: DC
  Administered 2014-02-10: 1 mg via ORAL
  Filled 2014-02-10: qty 1

## 2014-02-10 MED ORDER — MAGNESIUM HYDROXIDE 400 MG/5ML PO SUSP: 30.0000 mL | Freq: Every day | ORAL | Status: DC | PRN

## 2014-02-10 MED ORDER — ALUM & MAG HYDROXIDE-SIMETH 200-200-20 MG/5ML PO SUSP: 30.0000 mL | ORAL | Status: DC | PRN

## 2014-02-10 MED ORDER — SERTRALINE HCL 50 MG PO TABS
50.0000 mg | ORAL_TABLET | Freq: Every day | ORAL | Status: DC
  Administered 2014-02-10: 50 mg via ORAL
  Filled 2014-02-10: qty 1

## 2014-02-10 MED ORDER — ACETAMINOPHEN 325 MG PO TABS
650.0000 mg | ORAL_TABLET | Freq: Four times a day (QID) | ORAL | Status: DC | PRN
Start: 2014-02-10 — End: 2014-02-14

## 2014-02-10 NOTE — Plan of Care (Signed)
Problem: Alteration in mood Goal: LTG-Patient reports reduction in suicidal thoughts (Patient reports reduction in suicidal thoughts and is able to verbalize a safety plan for whenever patient is feeling suicidal) Outcome: Completed/Met Date Met:  02/10/14

## 2014-02-10 NOTE — Plan of Care (Signed)
Problem: Alteration in mood Goal: LTG-Pt's behavior demonstrates decreased signs of depression (Patient's behavior demonstrates decreased signs of depression to the point the patient is safe to return home and continue treatment in an outpatient setting) Outcome: Completed/Met Date Met:  02/10/14 Goal: STG-Patient is able to discuss feelings and issues (Patient is able to discuss feelings and issues leading to depression)  Outcome: Completed/Met Date Met:  02/10/14

## 2014-02-10 NOTE — Progress Notes (Signed)
Patient ID: Kimberly Contreras, female   DOB: [birth year], 28 y.o.   MRN: [id number]  28 year old black female [facility] that she was having thoughts of hurting herself and others. Pt reported that she has been depressed for some time now and that she can't get over the fact that her boyfriend is with someone else. Pt reported that she was having bad thoughts of really wanting to kill herself and her boyfriend, but at time of admission she reported that she was no longer having any thoughts of hurting herself or anyone else. Pt reported that she was also hearing voices prior to admission, but at present she was negative AH/VH. No other issues or concerns noted.

## 2014-02-10 NOTE — ED Notes (Signed)
TTS in room with pt  

## 2014-02-10 NOTE — Plan of Care (Signed)
Problem: Alteration in mood Goal: STG-Pt Able to Identify Plan For Continuing Care at D/C Pt. Will be able to identify a plan for continuing care at discharge Outcome: Completed/Met Date Met:  02/10/14

## 2014-02-10 NOTE — Plan of Care (Signed)
Problem: Alteration in mood Goal: STG-Patient reports thoughts of self-harm to staff Outcome: Completed/Met Date Met:  02/10/14

## 2014-02-10 NOTE — BH Assessment (Signed)
Clint Bolderori Beck, [facility], confirms adult [address] is currently at capacity. Contacted the following facilities for placement:  BED AVAILABLE, FAXED CLINICAL INFORMATION: [facility], per [facility], per [facility], per [place]  AT CAPACITY: [facility], [facility], per [facility], per [facility], per [facility], per [facility], per [facility], per [facility], per [facility], per [facility], per [facility], per Serita GritBernadine [facility], per [facility], per Cassandria AngerJean  NO RESPONSE: [facility]   [address]Samer Dutton Ellis Patsy BaltimoreWarrick Jr, WisconsinL[facility], [facility] Triage Specialist [phone]
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
## 2014-02-10 NOTE — BH Assessment (Signed)
[facility] Dr. Jannifer FranklinAkintayo and Narda AmberJamison Lord,NP. Bed assignment is 503-2. Nursing report [phone]. Support paperwork completed.

## 2014-02-10 NOTE — Plan of Care (Signed)
Problem: Consults Goal: Depression Patient Education See Patient Education Module for education specifics. Outcome: Completed/Met Date Met:  02/10/14
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
## 2014-02-11 DIAGNOSIS — R45851 Suicidal ideations: Secondary | ICD-10-CM

## 2014-02-11 DIAGNOSIS — F333 Major depressive disorder, recurrent, severe with psychotic symptoms: Secondary | ICD-10-CM

## 2014-02-11 DIAGNOSIS — R45850 Homicidal ideations: Secondary | ICD-10-CM

## 2014-02-11 LAB — RPR

## 2014-02-11 LAB — PREGNANCY, URINE: PREG TEST UR: NEGATIVE

## 2014-02-11 MED ORDER — NICOTINE 21 MG/24HR TD PT24
21.0000 mg | MEDICATED_PATCH | Freq: Every day | TRANSDERMAL | Status: DC
  Filled 2014-02-11 (×4): qty 1

## 2014-02-11 NOTE — Progress Notes (Signed)
Pt signed a 72 hour request for discharge on 02/11/14 at 6pm. Jacquelyne BalintShalita Catrina Fellenz RN

## 2014-02-11 NOTE — Progress Notes (Signed)
D: Pt has depressed affect and mood.  Pt reports "I'm ready to go."  Pt states "I don't really feel like anything's wrong with me."  When asked why she is here, pt reported "I guess I was having an emotional breakdown, but I'm starting to believe that I don't have no problems like depression or anxiety."  Pt is isolative and refuses to attend group.  Pt denies SI/HI, denies hallucinations.   A: Met with pt 1:1 and offered support and encouragement.  Safety maintained.   R: Pt verbally contracts for safety and is in no distress.  Will continue to monitor and assess for safety.
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
## 2014-02-11 NOTE — Progress Notes (Addendum)
Patient ID: Kimberly Contreras, female   DOB: 1986-02-20, 28 y.o.   MRN: [id number]  D: Pt has been very flat and depressed on the unit today, she was also very isolative. Pt remained in the bed most of the day, she did not engage in any treatment nor did she attend any groups. Pt reported that she just wanted her test results for any STD's since her boyfriend cheated on her. Pt submitted urine for testing. Pt reported her depression as a 0, and her helplessness/hopelessness as a 0. Pt reported being negative SI/HI, no AH/VH noted. A: 15 min checks continued for patient safety. R: Pt safety maintained.

## 2014-02-11 NOTE — Progress Notes (Signed)
D: Pt has sad affect and depressed mood.  Pt is isolative and has been in her room for the majority of the shift.  Pt reports she is "just tired."  Pt denies SI/HI.  Pt denies A/V hallucinations.  Pt is guarded with staff and does not forward much information.  Pt did not attend evening group.   A: Requested urine sample from pt for lab, pt reports she is not able to provide specimen at the time but that she would let staff know when she could.  Encouraged and supported pt.  Safety maintained.   R: Pt verbally contracts for safety.  Pt reports she would notify writer of needs and concerns.  Will continue to monitor and assess for safety.

## 2014-02-11 NOTE — [facility] Suicide Risk Assessment (Signed)
   Nursing information obtained from:    Demographic factors:    Current Mental Status:    Loss Factors:    Historical Factors:    Risk Reduction Factors:    Total Time spent with patient: 1 hour  CLINICAL FACTORS:   Depression:   Anhedonia Comorbid alcohol abuse/dependence Hopelessness Impulsivity Insomnia Recent sense of peace/wellbeing Alcohol/Substance Abuse/Dependencies  Psychiatric Specialty Exam: Physical Exam  ROS  Blood pressure 130/75, pulse 101, temperature 98.3 F (36.8 C), temperature source Oral, resp. rate 18, height 5\' 8"  (1.727 m), weight 141.522 kg (312 lb), last menstrual period 02/03/2014.Body mass index is 47.45 kg/(m^2).  General Appearance: Disheveled and Guarded  Eye Contact::  Minimal  Speech:  Slow  Volume:  Decreased  Mood:  Depressed, Dysphoric and Euphoric  Affect:  Constricted, Depressed and Flat  Thought Process:  Intact  Orientation:  Full (Time, Place, and Person)  Thought Content:  Hallucinations: Auditory and Rumination  Suicidal Thoughts:  Yes.  without intent/plan  Homicidal Thoughts:  No  Memory:  Immediate;   Fair Recent;   Fair Remote;   Fair  Judgement:  Impaired  Insight:  Lacking  Psychomotor Activity:  Decreased  Concentration:  Fair  Recall:  FiservFair  Fund of Knowledge:Fair  Language: Fair  Akathisia:  No  Handed:  Right  AIMS (if indicated):     Assets:  Social Support  Sleep:  Number of Hours: 6.75   Musculoskeletal: Strength & Muscle Tone: within normal limits Gait & Station: normal Patient leans: N/A  COGNITIVE FEATURES THAT CONTRIBUTE TO RISK:  Closed-mindedness Loss of executive function Polarized thinking    SUICIDE RISK:   Moderate:  Frequent suicidal ideation with limited intensity, and duration, some specificity in terms of plans, no associated intent, good self-control, limited dysphoria/symptomatology, some risk factors present, and identifiable protective factors, including available and accessible  social support.  PLAN OF CARE:  I certify that inpatient services furnished can reasonably be expected to improve the patient's condition.  Makai Dumond T. 02/11/2014, 10:20 AM

## 2014-02-11 NOTE — Plan of Care (Signed)
Problem: Ineffective individual coping Goal: STG: Patient will remain free from self harm Outcome: Progressing Pt has not harmed herself this shift.  She agreed to notify staff of any thoughts of self-harm.

## 2014-02-11 NOTE — Progress Notes (Signed)
Psychoeducational Group Note  Date:  02/11/2014 Time:  2100  Group Topic/Focus:  Wrap-Up Group:   The focus of this group is to help patients review their daily goal of treatment and discuss progress on daily workbooks.  Participation Level: Did Not Attend  Participation Quality:  Not Applicable  Affect:  Not Applicable  Cognitive:  Not Applicable  Insight:  Not Applicable  Engagement in Group: Not Applicable  Additional Comments:  The patient did not attend group this evening.  Livianna Petraglia S 02/11/2014, 9:00 PM

## 2014-02-11 NOTE — [facility] Group Notes (Signed)
[facility] Dis[facility] Planning [facility] Note   02/11/2014 10:34 AM  Participation Quality:  Minimal  Mood/Affect:  Flat  Depression Rating:  Denies  Anxiety Rating:  Denies  Thoughts of Suicide:  No Will you contract for safety?   NA  Current AVH:  Denies  Plan for Discharge/Comments:  "I thought I would come in for a night or two to talk to a Dr.  I don't know if I am getting meds. The last time I was here it was a little helpful."  Pt was vague, guarded in her responses.  States she has no outpt provider, lives with boyfriend of 5 years and is working on her G[facility] at [employer].  "I have 2 more tests to go."  Limited insight.  Transportation Means: boyfriend  Supports: boyfriend  Kiribatiorth, Nara VisaRodney B

## 2014-02-12 MED ORDER — ARIPIPRAZOLE 5 MG PO TABS
5.0000 mg | ORAL_TABLET | Freq: Every day | ORAL | Status: DC
  Administered 2014-02-12 – 2014-02-14 (×3): 5 mg via ORAL
  Filled 2014-02-12 (×5): qty 1

## 2014-02-12 MED ORDER — BUPROPION HCL ER (XL) 150 MG PO TB24
150.0000 mg | ORAL_TABLET | Freq: Every day | ORAL | Status: DC
  Administered 2014-02-12 – 2014-02-14 (×3): 150 mg via ORAL
  Filled 2014-02-12 (×5): qty 1

## 2014-02-12 NOTE — [facility] Group Notes (Signed)
[facility] Group Notes:  (Nursing/MHT/Case Management/Adjunct)  Date:  02/12/2014  Time:  12:28 PM  Type of Therapy:  Psychoeducational Skills  Participation Level:  Active  Participation Quality:  Appropriate  Affect:  Appropriate  Cognitive:  Appropriate  Insight:  Appropriate  Engagement in Group:  Engaged  Modes of Intervention:  Problem-solving  Summary of Progress/Problems: Pt attended healthy coping skills group.    Jacquelyne BalintForrest, Karine Garn Shanta 02/12/2014, 12:28 PM

## 2014-02-12 NOTE — B[facility] Group Notes (Signed)
[facility] Group Notes:  (Nursing/MHT/Case Management/Adjunct)  Date:  02/12/2014  Time:  11:27 AM  Type of Therapy:  Psychoeducational Skills  Participation Level:  Active  Participation Quality:  Appropriate  Affect:  Appropriate  Cognitive:  Appropriate  Insight:  Appropriate  Engagement in Group:  Engaged  Modes of Intervention:  Problem-solving  Summary of Progress/Problems: Pt attended patient self inventory group. Kimberly Contreras 02/12/2014, 11:27 AM 

## 2014-02-12 NOTE — Progress Notes (Signed)
Writer has observed patient up in the dayroom watching tv with minimal interaction with peers. She attended group this evening and once a verbal altercation disrupted group she went to her room to lie down. Writer spoke with her 1:1 and she voiced no complaints and denies si/hi/a/v hallucinations. Writer encouraged her to notify staff of any needs and she agreed that she would. Safety maintained on unit with 15 min checks.

## 2014-02-12 NOTE — Progress Notes (Signed)
[facility]  02/12/2014 11:41 AM Kimberly Contreras  MRN:  [id number] Subjective:  "I'm feeling ok.  I don't think I need medication"  Diagnosis:  Depression and Bipolar mood disorder  AXIS I: Major Depression, Recurrent severe; suicidal ideations, psychosis AXIS II: Deferred AXIS III:  Past Medical History  Diagnosis Date  . Abscess   . Depression   . Obesity   . Anxiety    AXIS IV: economic problems, housing problems, other psychosocial or environmental problems, problems related to social environment and problems with primary support group AXIS V: 21-30 behavior considerably influenced by delusions or hallucinations OR serious impairment in judgment, communication OR inability to function in almost all areas  ADL's:  Intact  Sleep: Good  Appetite:  Good  Suicidal Ideation:  Denies Homicidal Ideation:  Denies  Psychiatric Specialty Exam: Physical Exam  Vitals reviewed. Psychiatric: She has a normal mood and affect. Her behavior is normal. Judgment and thought content normal.    Review of Systems  Constitutional: Negative.   HENT: Negative.   Eyes: Negative.   Respiratory: Negative.   Cardiovascular: Negative.   Gastrointestinal: Negative.   Genitourinary: Negative.   Musculoskeletal: Negative.   Skin: Negative.   Neurological: Negative.   Endo/Heme/Allergies: Negative.   Psychiatric/Behavioral: Positive for depression. Negative for suicidal ideas, hallucinations, memory loss and substance abuse. The patient is nervous/anxious. The patient does not have insomnia.     Blood pressure 103/48, pulse 81, temperature 98.2 F (36.8 C), temperature source Oral, resp. rate 18, height 5\' 8"  (1.727 m), weight 141.522 kg (312 lb), last menstrual period 02/03/2014.Body mass index is 47.45 kg/(m^2).  General Appearance: Neat  Eye Contact::  Good  Speech:  Normal Rate  Volume:  Normal  Mood:  Anxious  Affect:  Appropriate  Thought Process:  Intact   Orientation:  Full (Time, Place, and Person)  Thought Content:  Rumination  Suicidal Thoughts:  No  Homicidal Thoughts:  No  Memory:  Immediate;   Good Recent;   Good Remote;   Good  Judgement:  Fair  Insight:  Fair  Psychomotor Activity:  Negative  Concentration:  Good  Recall:  Negative  Fund of Knowledge:Good  Language: Good  Akathisia:  Negative  Handed:  Right  AIMS (if indicated):     Assets:  Communication Skills Desire for Improvement Physical Health Resilience  Sleep:  Number of Hours: 6.75   Musculoskeletal: Strength & Muscle Tone: within normal limits Gait & Station: normal Patient leans: N/A  Current Medications: Current Facility-Administered Medications  Medication Dose Route Frequency Provider Last Rate Last Dose  . acetaminophen (TYLENOL) tablet 650 mg  650 mg Oral Q6H PRN Cleotis NipperSyed T Giovoni Bunch, MD      . alum & mag hydroxide-simeth (MAALOX/MYLANTA) 200-200-20 MG/5ML suspension 30 mL  30 mL Oral Q4H PRN Cleotis NipperSyed T Brityn Mastrogiovanni, MD      . ARIPiprazole (ABILIFY) tablet 5 mg  5 mg Oral Daily Sheila May Agustin, NP      . buPROPion (WELLBUTRIN XL) 24 hr tablet 150 mg  150 mg Oral Daily Sheila May Agustin, NP      . magnesium hydroxide (MILK OF MAGNESIA) suspension 30 mL  30 mL Oral Daily PRN Cleotis NipperSyed T Jimma Ortman, MD      . nicotine (NICODERM CQ - dosed in mg/24 hours) patch 21 mg  21 mg Transdermal Q0600 Lindwood QuaSheila May Agustin, NP   21 mg at 02/12/14 [id number]    Lab Results:  Results for orders placed or performed during  the hospital encounter of 02/10/14 (from the past 48 hour(s))  RPR     Status: None   Collection Time: 02/10/14  7:52 PM  Result Value Ref Range   RPR NON REAC NON REAC    Comment: Performed at [facility]  Pregnancy, urine     Status: None   Collection Time: 02/11/14 10:16 AM  Result Value Ref Range   Preg Test, Ur NEGATIVE NEGATIVE    Comment:        THE SENSITIVITY OF THIS METHODOLOGY IS >20 mIU/mL. Performed at [facility]      Physical Findings: AIMS:  , ,  ,  ,    CIWA:    COWS:     Treatment Plan Summary: Daily contact with patient to assess and evaluate symptoms and progress in treatment Medication management  Plan:  Review of chart, vital signs, medications, and notes.  1-Individual and group therapy  2-Medication management for mood lability, depression and anxiety: Medication HX reviewed with the patient.  Will start on Abilify 5 mg QD and Wellbutrin 150 mg QD. 3-Coping skills for depression, anxiety  4-Continue crisis stabilization and management  5-Address health issues--monitoring vital signs, stable  6-Treatment plan in progress to prevent relapse of depression and anxiety  Medical Decision Making Problem Points:  Established problem, stable/improving (1) Data Points:  Discuss tests with performing physician (1) Review of medication regiment & side effects (2)  I certify that inpatient services furnished can reasonably be expected to improve the patient's condition.   Adonis BrookGUSTIN, SHEILA MAY, AGNP-BC 02/12/2014, 11:41 AM  I agreed with the findings, treatment and disposition plan of this patient. Kathryne SharperSyed Shaunak Kreis, MD

## 2014-02-12 NOTE — Plan of Care (Signed)
Problem: Diagnosis: Increased Risk For Suicide Attempt Goal: STG-Patient Will Attend All Groups On The Unit Outcome: Not Progressing Pt did not attend evening group on 02/10/14 or 02/11/14.

## 2014-02-12 NOTE — [facility] Group Notes (Signed)
   [facility] LCSW Group Therapy Note  02/12/2014 11:15   Type of Therapy and Topic:  Group Therapy: Avoiding Self-Sabotaging and Enabling Behaviors  Participation Level:  Minimal  Mood: Depressed  Description of Group:     Learn how to identify obstacles, self-sabotaging and enabling behaviors, what are they, why do we do them and what needs do these behaviors meet? Discuss unhealthy relationships and how to have positive healthy boundaries with those that sabotage and enable. Explore aspects of self-sabotage and enabling in yourself and how to limit these self-destructive behaviors in everyday life. A scaling question is used to help patient look at where they are now in their motivation to change, from 1 to 10 (lowest to highest motivation).  Therapeutic Goals: 1. Patient will identify one obstacle that relates to self-sabotage and enabling behaviors 2. Patient will identify one personal self-sabotaging or enabling behavior they did prior to admission 3. Patient able to establish a plan to change the above identified behavior they did prior to admission:  4. Patient will demonstrate ability to communicate their needs through discussion and/or role plays.   Summary of Patient Progress: The main focus of today's process group was to discuss what "self-sabotage" means and use Motivational Interviewing to discuss what benefits, negative or positive, were involved in a self-identified self-sabotaging behavior. We then talked about reasons the patient may want to change the behavior and her current desire to change. A scaling question was used to help patient look at where they are now in motivation for change, from 1 to 10 (lowest to highest motivation).  Kimberly Contreras shared during warm up that she is looking forward to working on her relationship with [employer] and entering college (although it is noted pt recently failed her GED). [name] was quiet during group discussion after giving lengthy rambling  description of sabotage (by others vs self sabotage).  She was able to identify negative peers and procrastination as behaviors she wants to change and is motivated at a 4. She was unable to identify anything that would increase her motivation.     Therapeutic Modalities:   Cognitive Behavioral Therapy Person-Centered Therapy Motivational Interviewing   Carney Bernatherine C Harrill, LCSW

## 2014-02-12 NOTE — Progress Notes (Signed)
Patient ID: Kimberly Contreras, female   DOB: April 02, 1985, 28 y.o.   MRN: [id number] D. Pt presents with depressed mood, affect congruent. [name] reports ''I'm doing fine. i don't know why I still need to be here, I shouldn't have to come to the hospital three times to get diagnosed with Bipolar disorder. '' Pt denies any SI/HI/A/V Hallucinations. Pt has been interactive on the unit today, attending programming. She denies any other acute concerns. Discussed with provider that pt has signed 72 hour request for discharge. R. Patient is safe. In no acute distress . Will continue to monitor q 15 minutes for safety.

## 2014-02-13 NOTE — Progress Notes (Signed)
D: Pt has depressed affect and mood.  Pt reports she is "alright."  Pt reported her goal for the day was to "stay positive.  I'm supposed to be going home tomorrow."  Pt reports she feels ready for discharge.  Pt denies SI/HI, denies hallucinations.  Pt is guarded and has minimal interactions with peers; she attended part of group tonight. A: Safety maintained.  Met with pt 1:1 and offered support and encouragement.   R: Pt reported she would notify writer of needs and concerns.  Pt verbally contracts for safety.  Will continue to monitor and assess for safety.

## 2014-02-13 NOTE — Progress Notes (Signed)
Patient ID: Kimberly Contreras, female   DOB: Jan 16, 1986, 28 y.o.   MRN: [id number] D. Pt presents with depressed mood, affect irritable. Roderic Scarce. Kimberly Contreras reports she '' feels better now and is ready to go home. '' She denies any depression or anxiety, and continues to deny any auditory hallucinations. She remains guarded with Clinical research associatewriter , minimally answering questions when Clinical research associatewriter tried to assess. She remains very focused on discharge. Pt did complete self inventory sheet and rates her depression at 0/10 on depression scale. Pt has been interactive on the unit today, attending programming. She denies any other acute concerns.  R. Patient is safe. In no acute distress . Will continue to monitor q 15 minutes for safety.

## 2014-02-13 NOTE — [facility] Group Notes (Signed)
[facility] Group Notes:  (Nursing/MHT/Case Management/Adjunct)  Date:  02/13/2014  Time:  10:10 AM  Type of Therapy: Psychoeducational Skills  Participation Level: Active  Participation Quality: Appropriate  Affect: Appropriate  Cognitive: Appropriate  Insight: Appropriate  Engagement in Group: Engaged  Modes of Intervention: Problem-solving  Summary of Progress/Problems: Pt attended healthy coping skills group.   Jacquelyne BalintForrest, Karion Cudd Shanta 02/13/2014, 10:10 AM

## 2014-02-13 NOTE — Progress Notes (Signed)
Patient ID: Kimberly Contreras Brill, female   DOB: Mar 17, 1986, 28 y.o.   MRN: [id number] [facility]  02/13/2014 3:48 PM Kimberly Contreras Oviatt  MRN:  [id number] Subjective:  "I'm feeling ok.  I'm just waiting to be discharged" Objective:  Patient observed to interacting with some of the patients.  She is not exhibiting disruptive behavior.  She is less tearful today and expressed her desire to be discharged.  She states that she is going to take the GED again.  She is tolerating the Abilify and the Wellbutrin.  No adverse S/E's noted.  She denies SI/HI/AVH.   Diagnosis:  Depression and Bipolar mood disorder  AXIS I: Major Depression, Recurrent severe; suicidal ideations, psychosis AXIS II: Deferred AXIS III:  Past Medical History  Diagnosis Date  . Abscess   . Depression   . Obesity   . Anxiety    AXIS IV: economic problems, housing problems, other psychosocial or environmental problems, problems related to social environment and problems with primary support group AXIS V: 21-30 behavior considerably influenced by delusions or hallucinations OR serious impairment in judgment, communication OR inability to function in almost all areas  ADL's:  Intact  Sleep: Good  Appetite:  Good  Suicidal Ideation:  Denies Homicidal Ideation:  Denies  Psychiatric Specialty Exam: Physical Exam  Vitals reviewed. Psychiatric: She has a normal mood and affect. Her behavior is normal. Judgment and thought content normal.    Review of Systems  Constitutional: Negative.   HENT: Negative.   Eyes: Negative.   Respiratory: Negative.   Cardiovascular: Negative.   Gastrointestinal: Negative.   Genitourinary: Negative.   Musculoskeletal: Negative.   Skin: Negative.   Neurological: Negative.   Endo/Heme/Allergies: Negative.   Psychiatric/Behavioral: Positive for depression. Negative for suicidal ideas, hallucinations, memory loss and substance abuse. The patient is nervous/anxious. The  patient does not have insomnia.     Blood pressure 111/66, pulse 64, temperature 98.6 F (37 C), temperature source Oral, resp. rate 20, height 5\' 8"  (1.727 m), weight 141.522 kg (312 lb), last menstrual period 02/03/2014.Body mass index is 47.45 kg/(m^2).  General Appearance: Neat  Eye Contact::  Good  Speech:  Normal Rate  Volume:  Normal  Mood:  Anxious  Affect:  Appropriate  Thought Process:  Intact  Orientation:  Full (Time, Place, and Person)  Thought Content:  Rumination  Suicidal Thoughts:  No  Homicidal Thoughts:  No  Memory:  Immediate;   Good Recent;   Good Remote;   Good  Judgement:  Fair  Insight:  Fair  Psychomotor Activity:  Negative  Concentration:  Good  Recall:  Negative  Fund of Knowledge:Good  Language: Good  Akathisia:  Negative  Handed:  Right  AIMS (if indicated):     Assets:  Communication Skills Desire for Improvement Physical Health Resilience  Sleep:  Number of Hours: 6.25   Musculoskeletal: Strength & Muscle Tone: within normal limits Gait & Station: normal Patient leans: N/A  Current Medications: Current Facility-Administered Medications  Medication Dose Route Frequency Provider Last Rate Last Dose  . acetaminophen (TYLENOL) tablet 650 mg  650 mg Oral Q6H PRN Cleotis NipperSyed T Arfeen, MD      . alum & mag hydroxide-simeth (MAALOX/MYLANTA) 200-200-20 MG/5ML suspension 30 mL  30 mL Oral Q4H PRN Cleotis NipperSyed T Arfeen, MD      . ARIPiprazole (ABILIFY) tablet 5 mg  5 mg Oral Daily Lavone Weisel May Anaysia Germer, NP   5 mg at 02/13/14 0800  . buPROPion (WELLBUTRIN XL) 24 hr tablet 150  mg  150 mg Oral Daily [place] QuaSheila May Vineeth Fell, NP   150 mg at 02/13/14 0800  . magnesium hydroxide (MILK OF MAGNESIA) suspension 30 mL  30 mL Oral Daily PRN Cleotis NipperSyed T Arfeen, MD      . nicotine (NICODERM CQ - dosed in mg/24 hours) patch 21 mg  21 mg Transdermal Q0600 [place] QuaSheila May Josslin Sanjuan, NP   21 mg at 02/12/14 [id number]    Lab Results:  No results found for this or any previous visit (from the past 48  hour(s)).  Physical Findings: AIMS:  , ,  ,  ,    CIWA:    COWS:     Treatment Plan Summary: Daily contact with patient to assess and evaluate symptoms and progress in treatment Medication management  Plan:  Review of chart, vital signs, medications, and notes.  1-Individual and group therapy  2-Medication management for mood lability, depression and anxiety: Medication HX reviewed with the patient.  Doing well on Abilify 5 mg QD and Wellbutrin 150 mg QD. 3-Coping skills for depression, anxiety  4-Continue crisis stabilization and management  5-Address health issues--monitoring vital signs, stable  6-Treatment plan in progress to prevent relapse of depression and anxiety  Medical Decision Making Problem Points:  Established problem, stable/improving (1) Data Points:  Discuss tests with performing physician (1) Review of medication regiment & side effects (2)  I certify that inpatient services furnished can reasonably be expected to improve the patient's condition.   Adonis BrookGUSTIN, Parnell Spieler MAY, AGNP-BC 02/13/2014, 3:48 PM  I agreed with the findings, treatment and disposition plan of this patient. Kathryne SharperSyed Arfeen, MD

## 2014-02-13 NOTE — Plan of Care (Signed)
Problem: Ineffective individual coping Goal: STG: Patient will remain free from self harm Outcome: Progressing Patient has remained free from harm as evidenced by 15 min checks.     

## 2014-02-13 NOTE — [facility] Group Notes (Signed)
[facility] Group Notes:  (Nursing/MHT/Case Management/Adjunct)  Date:  02/13/2014  Time:  9:14 AM  Type of Therapy: Psychoeducational Skills  Participation Level: Active  Participation Quality: Appropriate  Affect: Appropriate  Cognitive: Appropriate  Insight: Appropriate  Engagement in Group: Engaged  Modes of Intervention: Problem-solving  Summary of Progress/Problems: Pt attended patient self inventory group.  Jacquelyne BalintForrest, Aariz Maish Shanta 02/13/2014, 9:14 AM

## 2014-02-13 NOTE — [facility] Group Notes (Signed)
[facility] LCSW Group Therapy  02/13/2014 11:00 AM   Type of Therapy:  Group Therapy  Participation Level:  Did Not Attend - pt came to group at the very end and did not share anything  Kimberly IvanChelsea Horton, LCSW 02/13/2014 12:05 PM

## 2014-02-13 NOTE — Progress Notes (Signed)
Adult Psychoeducational Group Note  Date:  02/13/2014 Time:  8:55 PM  Group Topic/Focus:  Wrap-Up Group:   The focus of this group is to help patients review their daily goal of treatment and discuss progress on daily workbooks.  Participation Level:  Minimal  Participation Quality:  Appropriate and Drowsy  Affect:  Appropriate  Cognitive:  Appropriate  Insight: Appropriate  Engagement in Group:  Engaged  Modes of Intervention:  Support  Additional Comments:  Pt stated her goal for today was to stay positive and "not let anything get me down." Pt stated her only support is herself. Writer encouraged her to try to add more supports to her support system  Caswell CorwinOwen, Nikolai Wilczak C 02/13/2014, 8:55 PM

## 2014-02-14 LAB — GC/CHLAMYDIA PROBE AMP
CT Probe RNA: NEGATIVE
GC Probe RNA: NEGATIVE

## 2014-02-14 MED ORDER — ARIPIPRAZOLE 5 MG PO TABS: 5.0000 mg | ORAL_TABLET | Freq: Every day | ORAL | Status: DC

## 2014-02-14 MED ORDER — NICOTINE 21 MG/24HR TD PT24: 21.0000 mg | MEDICATED_PATCH | Freq: Every day | TRANSDERMAL | Status: DC

## 2014-02-14 MED ORDER — BUPROPION HCL ER (XL) 150 MG PO TB24: 150.0000 mg | ORAL_TABLET | Freq: Every day | ORAL | Status: DC

## 2014-02-14 MED ORDER — BENZONATATE 100 MG PO CAPS: 100.0000 mg | ORAL_CAPSULE | Freq: Three times a day (TID) | ORAL | Status: DC

## 2014-02-14 MED ORDER — ALBUTEROL SULFATE HFA 108 (90 BASE) MCG/ACT IN AERS: 1.0000 | INHALATION_SPRAY | Freq: Four times a day (QID) | RESPIRATORY_TRACT | Status: DC | PRN

## 2014-02-14 MED ORDER — GUAIFENESIN ER 600 MG PO TB12: 1200.0000 mg | ORAL_TABLET | Freq: Two times a day (BID) | ORAL | Status: DC

## 2014-02-14 NOTE — [facility] Suicide Risk Assessment (Signed)
[facility] INPATIENT:  Family/Significant Other Suicide Prevention Education  Suicide Prevention Education:  Patient Refusal for Family/Significant Other Suicide Prevention Education: The patient Kimberly Contreras has refused to provide written consent for family/significant other to be provided Family/Significant Other Suicide Prevention Education during admission and/or prior to discharge.  Physician notified.  SPE completed with pt. SPI pamphlet provided to pt and she was encouraged to share information with support network, ask questions, and talk about any concerns relating to to SPE.  Smart, Tanishka Drolet LCSWA  02/14/2014, 10:56 AM

## 2014-02-14 NOTE — Progress Notes (Signed)
[facility] :  Will you be returning to the same living situation after discharge: Yes,  home "with my friend" At discharge, do you have transportation home?:Yes,  bus pass Do you have the ability to pay for your medications:Yes,  mental health  Release of information consent forms completed and submitted to medical records by CSW.  Patient to Follow up at: Follow-up Information    Follow up with [facility].   Why:  Walk in between 8am-9am Monday through Friday for hospital follow-up/assessment for Mental Health services.    Contact information:   [address] [place], [place] [address] Phone: [phone] Fax: [phone]      Patient denies SI/HI:   Yes,  during self report/group    Safety Planning and Suicide Prevention discussed:  Yes,  SPE completed with pt, as she refused to consent to family contact. SPI pamphlet provided to pt and she was encouraged to share information with support network, ask questions, and talk about any concerns relating to SPE.  Smart, Margerite Impastato  LCSWA   02/14/2014, 11:13 AM

## 2014-02-14 NOTE — [facility] Group Notes (Signed)
[facility]   02/14/2014 10:18 AM  Participation Quality:  Minimal   Mood/Affect:  Appropriate  Depression Rating:  0  Anxiety Rating:  0  Thoughts of Suicide:  No Will you contract for safety?   NA  Current AVH:  No  Plan for Discharge/Comments:  Pt reports that she is ready to d/c today and would like to leave "as early as possible." [facility]. Pt plans to return home with "a friend" and provides minimal information about her situation.   Transportation Means: bus pass   Supports: "my friendRegulatory affairs officer"   Smart, OncologistHeather LCSWA

## 2014-02-14 NOTE — Plan of Care (Signed)
Problem: Diagnosis: Increased Risk For Suicide Attempt Goal: STG-Patient Will Attend All Groups On The Unit Outcome: Not Progressing Pt did not attend evening group on 02/13/14

## 2014-02-14 NOTE — Discharge Summary (Signed)
Physician Discharge Summary Note  Patient:  Kimberly Contreras is an 28 y.o., female MRN:  [id number] DOB:  [birth year] Patient phone:  [phone] (home)  Patient address:   [address] [place] [address],  Total Time spent with patient: 30 minutes  Date of Admission:  02/10/2014 Date of Discharge: 02/14/2014  Reason for Admission:  Depression  Discharge Diagnoses:  MDD,recurrent severe with psychotic features (resolving)  Active Problems:   Severe recurrent major depressive disorder with psychotic features  Psychiatric Specialty Exam: Physical Exam  Vitals reviewed. Psychiatric: She has a normal mood and affect. Her behavior is normal. Judgment and thought content normal.    Review of Systems  Constitutional: Negative.   HENT: Negative.   Eyes: Negative.   Respiratory: Negative.   Cardiovascular: Negative.   Gastrointestinal: Negative.   Genitourinary: Negative.   Musculoskeletal: Negative.   Skin: Negative.   Neurological: Negative.   Endo/Heme/Allergies: Negative.   Psychiatric/Behavioral: Positive for depression. Negative for suicidal ideas, hallucinations, memory loss and substance abuse. The patient is nervous/anxious. The patient does not have insomnia.     Blood pressure 110/66, pulse 85, temperature 99.4 F (37.4 C), temperature source Oral, resp. rate 16, height 5\' 8"  (1.727 m), weight 141.522 kg (312 lb), last menstrual period 02/03/2014.Body mass index is 47.45 kg/(m^2).   Past Psychiatric History: Diagnosis:  Depression  [facility]  Outpatient Care:  Substance Abuse Care:  Self-Mutilation:  denies  Suicidal Attempts:  denies  Violent Behaviors:  dnies   Musculoskeletal: Strength & Muscle Tone: within normal limits Gait & Station: normal Patient leans: N/A  Discharge Diagnoses: DSM5: Primary Psychiatric Diagnosis: MDD,recurrent severe with psychotic features (resolving)  Secondary Psychiatric Diagnosis: GAD per  history  Non Psychiatric Diagnosis: Obesity  See PMH  Past Medical History  Diagnosis Date  . Abscess   . Depression   . Obesity   . Anxiety    Level of Care:  OP   Hospital Course:  Kimberly Contreras Kimberly Contreras is a 28 yr old female who [facility]. She states that she recently took her GED and failed the exam. She has had other failed attempts to pass certain parts of the exam. She also states that she plans to re-take once again.  Patient verbalized her "feeling of being stuck". She feels that stress of life in general has hindered her ability to pass the exam. Furthermore, she expressed that she feels that her family is not a good support for her. She requested to be tested for STD's as she had sexual encounter that she felt nervous about.    Kimberly Contreras was admitted for inpatient treatment  She needed medication management for crisis stabilization.  She tolerated Abilify and Wellbutrin without event.  The patient was observed to be reluctant in taking meds.  She was anxious to go home stating that she had several things to accomplish for a move out of state.  She did manage to be compliant in taking her Abilify for mood stabilization and Wellbutrin for depression.  She was non-disruptive on the unit.   On day of discharge, she verbalized that her anxiety and depression were minimal and manageable.  She verbalized understanding in being adherent to follow up appointments and medication regimen to help with long term recovery and to minimize risk of another crisis event.    Consults:  psychiatry  Significant Diagnostic Studies:  labs: PER ED  Discharge Vitals:   Blood pressure 110/66, pulse 85, temperature 99.4 F (  37.4 C), temperature source Oral, resp. rate 16, height 5\' 8"  (1.727 m), weight 141.522 kg (312 lb), last menstrual period 02/03/2014. Body mass index is 47.45 kg/(m^2). Lab Results:   No results found for this or any previous visit (from the past 72  hour(s)).  Physical Findings: AIMS:  , ,  ,  ,    CIWA:    COWS:     Psychiatric Specialty Exam: See Psychiatric Specialty Exam and Suicide Risk Assessment completed by Attending Physician prior to discharge.  Discharge destination:  Home  Is patient on multiple antipsychotic therapies at discharge:  No   Has Patient had three or more failed trials of antipsychotic monotherapy by history:  No  Recommended Plan for Multiple Antipsychotic Therapies: NA    Medication List    STOP taking these medications        acetaminophen 500 MG tablet  Commonly known as:  TYLENOL     phenazopyridine 200 MG tablet  Commonly known as:  PYRIDIUM      TAKE these medications      Indication   albuterol 108 (90 BASE) MCG/ACT inhaler  Commonly known as:  PROVENTIL HFA;VENTOLIN HFA  Inhale 1 puff into the lungs every 6 (six) hours as needed for wheezing or shortness of breath.   Indication:  Chronic Obstructive Lung Disease     ARIPiprazole 5 MG tablet  Commonly known as:  ABILIFY  Take 1 tablet (5 mg total) by mouth daily.   Indication:  Mood Stabilizer     benzonatate 100 MG capsule  Commonly known as:  TESSALON  Take 1 capsule (100 mg total) by mouth every 8 (eight) hours.   Indication:  Cough     buPROPion 150 MG 24 hr tablet  Commonly known as:  WELLBUTRIN XL  Take 1 tablet (150 mg total) by mouth daily.   Indication:  Major Depressive Disorder     guaiFENesin 600 MG 12 hr tablet  Commonly known as:  MUCINEX  Take 2 tablets (1,200 mg total) by mouth 2 (two) times daily.   Indication:  Cough     nicotine 21 mg/24hr patch  Commonly known as:  NICODERM CQ - dosed in mg/24 hours  Place 1 patch (21 mg total) onto the skin daily at 6 (six) AM.   Indication:  Nicotine Addiction       Follow-up Information    Follow up with [facility].   Why:  Walk in between 8am-9am Monday through Friday for hospital follow-up/assessment for Mental Health services.    Contact information:   [address] [place], [place] [address] Phone: [phone] Fax: [phone]      Follow-up recommendations:  Activity:  AS TOL, DIET AS TOL  Comments:  1.  Take all your medications as prescribed.              2.  Report any adverse side effects to outpatient provider.                       3.  Patient instructed to not use alcohol or illegal drugs while on prescription medicines.            4.  In the event of worsening symptoms, instructed patient to call 911, the crisis hotline or go to nearest emergency room for evaluation of symptoms.  Total Discharge Time:  Greater than 30 minutes.  SignedAdonis Brook: Furqan Gosselin MAY, AGNP-BC 02/17/2014, 9:43 AM

## 2014-02-14 NOTE — Progress Notes (Signed)
Patient ID: Kimberly Contreras, female   DOB: [birth year], 28 y.o.   MRN: [id number] Pt received both written and verbal discharge instructions. Pt verbalized understanding of discharge instructions. Pt agreed to f/u appt and med regimen. Pt denies SI/HI/AVH/Dep. Pt received sample meds, prescriptions, bus pass and belongings. No complaints verbalized by pt. [facility] pts [facility].

## 2014-02-14 NOTE — Tx Team (Signed)
Interdisciplinary Treatment Plan Update (Adult)   Date: 02/14/2014  Time Reviewed:10:53 AM  Progress in Treatment:  Attending groups:Yes  Participating in groups: Minimally   Taking medication as prescribed: Yes  Tolerating medication: Yes  Family/Significant othe contact made: No. Pt refused to consent to family/collateral contact.   Patient understands diagnosis: Yes, AEB seeking treatment for depression, mood instability, and for medication stabilization.  Discussing patient identified problems/goals with staff: Yes  Medical problems stabilized or resolved: Yes  Denies suicidal/homicidal ideation: Yes during group/self report.  Patient has not harmed self or Others: Yes  New problem(s) identified:  Discharge Plan or Barriers: Pt plans to return home "with my friend" and will need bus pass. [facility]rapy (walk in). Bus Pass provided by CSW.  Additional comments: Kimberly Contreras is a 28 yr old female who [facility]. She states that she recently took her GED and failed the exam. She has had other failed attempts to pass certain parts of the exam. She also states that she plans to re-take once again. Patient verbalized her "feeling of being stuck". She feels that stress of life in general has hindered her ability to pass the exam. Furthermore, she expressed that she feels that her family is not a good support for her. She requested to be tested for STD's as she had sexual encounter that she felt nervous about.  Reason for Continuation of Hospitalization: none  Estimated length of stay: d/c today For review of initial/current patient goals, please see plan of care.  Attendees:  Patient:    Family:    Physician: Dr. Elna BreslowEappen, MD 02/14/2014 10:52 AM   Nursing: Mike CrazePatrice, Ronecia, Beverly MD 02/14/2014 10:52 AM   Clinical Social Worker [employer]Heather Smart, LCSWA  02/14/2014 10:52 AM   Other: Richelle Itood North LCSW; Santa Generanne Cunningham  LCSW 02/14/2014 10:52 AM   Other: Darden DatesJennifer C. Nurse CM 02/14/2014 10:52 AM   Other: Liliane Badeolora Sutton, Community Care Coordinator  02/14/2014 10:52 AM   Other: Mercy RidingValerie, [facility] Care Coordinator  02/14/2014 10:52 AM   Scribe for Treatment Team:  [employer]Heather Smart LCSWA 02/14/2014 10:53 AM

## 2014-02-14 NOTE — [facility] Suicide Risk Assessment (Signed)
   Demographic Factors:  Low socioeconomic status  Total Time spent with patient: 45 minutes  Psychiatric Specialty Exam: Physical Exam  ROS  Blood pressure 110/66, pulse 85, temperature 99.4 F (37.4 C), temperature source Oral, resp. rate 16, height 5\' 8"  (1.727 m), weight 141.522 kg (312 lb), last menstrual period 02/03/2014.Body mass index is 47.45 kg/(m^2).  General Appearance: Casual  Eye Contact::  Fair  Speech:  Clear and Coherent  Volume:  Normal  Mood:  Euthymic  Affect:  Congruent  Thought Process:  Goal Directed  Orientation:  Full (Time, Place, and Person)  Thought Content:  WDL  Suicidal Thoughts:  No  Homicidal Thoughts:  No  Memory:  Immediate;   Fair Recent;   Fair Remote;   Fair  Judgement:  Fair  Insight:  Fair  Psychomotor Activity:  Normal  Concentration:  Fair  Recall:  [employer]Fair  Fund of Knowledge:Fair  Language: Good  Akathisia:  No  Handed:  Right  AIMS (if indicated):     Assets:  Communication Skills Desire for Improvement  Sleep:  Number of Hours: 5.5    Musculoskeletal: Strength & Muscle Tone: within normal limits Gait & Station: normal Patient leans: N/A   Mental Status Per Nursing Assessment::   On Admission:     Current Mental Status by Physician: Patient reports that she was depressed since she was not on the right medications and she failed her GED X4 since she could not focus. Pt currently reports feeling better,denies SI/HI/AH/VH  Loss Factors: Decrease in vocational status  Historical Factors: Impulsivity  Risk Reduction Factors:   Living with another person, especially a relative and Positive social support  Continued Clinical Symptoms:  Previous Psychiatric Diagnoses and Treatments  Cognitive Features That Contribute To Risk:  Patient is alert,oriented x4    Suicide Risk:  Minimal: No identifiable suicidal ideation.    Discharge Diagnoses:  Primary Psychiatric Diagnosis: MDD,recurrent severe with psychotic  features (resolving)   Secondary Psychiatric Diagnosis: GAD per history   Non Psychiatric Diagnosis: Obesity  See PMH  Past Medical History  Diagnosis Date  . Abscess   . Depression   . Obesity   . Anxiety     Plan Of Care/Follow-up recommendations:  Activity:  no restrictions Diet:  regular  Is patient on multiple antipsychotic therapies at discharge:  No   Has Patient had three or more failed trials of antipsychotic monotherapy by history:  No  Recommended Plan for Multiple Antipsychotic Therapies: NA    Shaelin Lalley MD 02/14/2014, 9:49 AM

## 2014-02-16 NOTE — Progress Notes (Signed)
Patient Discharge Instructions:  After Visit Summary (AVS):   Faxed to:  02/16/14 Psychiatric Admission Assessment Note:   Faxed to:  02/16/14 Suicide Risk Assessment - Discharge Assessment:   Faxed to:  02/16/14 Faxed/Sent to the Next Level Care provider:  02/16/14 [facility] @ [phone]  Jerelene ReddenSheena E [place], 02/16/2014, 2:28 PM

## 2014-02-18 ENCOUNTER — Encounter (HOSPITAL_COMMUNITY): Payer: Self-pay | Admitting: Emergency Medicine

## 2014-02-18 ENCOUNTER — Emergency Department (HOSPITAL_COMMUNITY)
Admission: EM | Admit: 2014-02-18 | Discharge: 2014-02-19 | Disposition: A | Discharge status: Home / Self Care | Payer: Self-pay | Attending: Emergency Medicine | Admitting: Emergency Medicine

## 2014-02-18 DIAGNOSIS — Z720 Tobacco use: Secondary | ICD-10-CM | POA: Insufficient documentation

## 2014-02-18 DIAGNOSIS — B9789 Other viral agents as the cause of diseases classified elsewhere: Secondary | ICD-10-CM

## 2014-02-18 DIAGNOSIS — Z872 Personal history of diseases of the skin and subcutaneous tissue: Secondary | ICD-10-CM | POA: Insufficient documentation

## 2014-02-18 DIAGNOSIS — Z79899 Other long term (current) drug therapy: Secondary | ICD-10-CM | POA: Insufficient documentation

## 2014-02-18 DIAGNOSIS — F419 Anxiety disorder, unspecified: Secondary | ICD-10-CM | POA: Insufficient documentation

## 2014-02-18 DIAGNOSIS — J069 Acute upper respiratory infection, unspecified: Secondary | ICD-10-CM | POA: Insufficient documentation

## 2014-02-18 DIAGNOSIS — F329 Major depressive disorder, single episode, unspecified: Secondary | ICD-10-CM | POA: Insufficient documentation

## 2014-02-18 DIAGNOSIS — E669 Obesity, unspecified: Secondary | ICD-10-CM | POA: Insufficient documentation

## 2014-02-18 NOTE — ED Notes (Signed)
Pt. reports productive cough onset 3 days ago , denies fever or chills , respirations unlabored .

## 2014-02-19 MED ORDER — BENZONATATE 100 MG PO CAPS: 100.0000 mg | ORAL_CAPSULE | Freq: Three times a day (TID) | ORAL | Status: DC

## 2014-02-19 MED ORDER — GUAIFENESIN 100 MG/5ML PO LIQD: 100.0000 mg | ORAL | Status: DC | PRN

## 2014-02-19 NOTE — Discharge Instructions (Signed)
Upper Respiratory Infection, Adult An upper respiratory infection (URI) is also sometimes known as the common cold. The upper respiratory tract includes the nose, sinuses, throat, trachea, and bronchi. Bronchi are the airways leading to the lungs. Most people improve within 1 week, but symptoms can last up to 2 weeks. A residual cough may last even longer.  CAUSES Many different viruses can infect the tissues lining the upper respiratory tract. The tissues become irritated and inflamed and often become very moist. Mucus production is also common. A cold is contagious. You can easily spread the virus to others by oral contact. This includes kissing, sharing a glass, coughing, or sneezing. Touching your mouth or nose and then touching a surface, which is then touched by another person, can also spread the virus. SYMPTOMS  Symptoms typically develop 1 to 3 days after you come in contact with a cold virus. Symptoms vary from person to person. They may include:  Runny nose.  Sneezing.  Nasal congestion.  Sinus irritation.  Sore throat.  Loss of voice (laryngitis).  Cough.  Fatigue.  Muscle aches.  Loss of appetite.  Headache.  Low-grade fever. DIAGNOSIS  You might diagnose your own cold based on familiar symptoms, since most people get a cold 2 to 3 times a year. Your caregiver can confirm this based on your exam. Most importantly, your caregiver can check that your symptoms are not due to another disease such as strep throat, sinusitis, pneumonia, asthma, or epiglottitis. Blood tests, throat tests, and X-rays are not necessary to diagnose a common cold, but they may sometimes be helpful in excluding other more serious diseases. Your caregiver will decide if any further tests are required. RISKS AND COMPLICATIONS  You may be at risk for a more severe case of the common cold if you smoke cigarettes, have chronic heart disease (such as heart failure) or lung disease (such as asthma), or if  you have a weakened immune system. The very young and very old are also at risk for more serious infections. Bacterial sinusitis, middle ear infections, and bacterial pneumonia can complicate the common cold. The common cold can worsen asthma and chronic obstructive pulmonary disease (COPD). Sometimes, these complications can require emergency medical care and may be life-threatening. PREVENTION  The best way to protect against getting a cold is to practice good hygiene. Avoid oral or hand contact with people with cold symptoms. Wash your hands often if contact occurs. There is no clear evidence that vitamin C, vitamin E, echinacea, or exercise reduces the chance of developing a cold. However, it is always recommended to get plenty of rest and practice good nutrition. TREATMENT  Treatment is directed at relieving symptoms. There is no cure. Antibiotics are not effective, because the infection is caused by a virus, not by bacteria. Treatment may include:  Increased fluid intake. Sports drinks offer valuable electrolytes, sugars, and fluids.  Breathing heated mist or steam (vaporizer or shower).  Eating chicken soup or other clear broths, and maintaining good nutrition.  Getting plenty of rest.  Using gargles or lozenges for comfort.  Controlling fevers with ibuprofen or acetaminophen as directed by your caregiver.  Increasing usage of your inhaler if you have asthma. Zinc gel and zinc lozenges, taken in the first 24 hours of the common cold, can shorten the duration and lessen the severity of symptoms. Pain medicines may help with fever, muscle aches, and throat pain. A variety of non-prescription medicines are available to treat congestion and runny nose. Your caregiver   can make recommendations and may suggest nasal or lung inhalers for other symptoms.  HOME CARE INSTRUCTIONS   Only take over-the-counter or prescription medicines for pain, discomfort, or fever as directed by your  caregiver.  Use a warm mist humidifier or inhale steam from a shower to increase air moisture. This may keep secretions moist and make it easier to breathe.  Drink enough water and fluids to keep your urine clear or pale yellow.  Rest as needed.  Return to work when your temperature has returned to normal or as your caregiver advises. You may need to stay home longer to avoid infecting others. You can also use a face mask and careful hand washing to prevent spread of the virus. SEEK MEDICAL CARE IF:   After the first few days, you feel you are getting worse rather than better.  You need your caregiver's advice about medicines to control symptoms.  You develop chills, worsening shortness of breath, or brown or red sputum. These may be signs of pneumonia.  You develop yellow or brown nasal discharge or pain in the face, especially when you bend forward. These may be signs of sinusitis.  You develop a fever, swollen neck glands, pain with swallowing, or white areas in the back of your throat. These may be signs of strep throat. SEEK IMMEDIATE MEDICAL CARE IF:   You have a fever.  You develop severe or persistent headache, ear pain, sinus pain, or chest pain.  You develop wheezing, a prolonged cough, cough up blood, or have a change in your usual mucus (if you have chronic lung disease).  You develop sore muscles or a stiff neck. Document Released: 08/28/2000 Document Revised: 05/27/2011 Document Reviewed: 06/09/2013 ExitCare Patient Information 2015 [employer]. This information is not intended to replace advice given to you by your health care provider. Make sure you discuss any questions you have with your health care provider.  

## 2014-02-19 NOTE — ED Provider Notes (Signed)
CSN: [id number]     Arrival date & time 02/18/14  2321 History   First MD Initiated Contact with Patient 02/19/14 0059     Chief Complaint  Patient presents with  . Cough     (Consider location/radiation/quality/duration/timing/severity/associated sxs/prior Treatment) Patient is a 28 y.o. female presenting with cough. The history is provided by the patient. No language interpreter was used.  Cough Cough characteristics:  Productive Sputum characteristics:  Green Severity:  Moderate Onset quality:  Sudden Duration:  3 days Timing:  Sporadic Progression:  Waxing and waning Chronicity:  New Smoker: yes   Context: upper respiratory infection   Relieved by:  None tried Associated symptoms: rhinorrhea and sinus congestion   Associated symptoms: no fever     Past Medical History  Diagnosis Date  . Abscess   . Depression   . Obesity   . Anxiety    Past Surgical History  Procedure Laterality Date  . Appendectomy     Family History  Problem Relation Age of Onset  . Cancer Other   . Hypertension Other   . Diabetes Other    History  Substance Use Topics  . Smoking status: Current Every Day Smoker -- 1.00 packs/day    Types: Cigarettes  . Smokeless tobacco: Never Used  . Alcohol Use: No   OB History    No data available     Review of Systems  Constitutional: Negative for fever.  HENT: Positive for rhinorrhea.   Respiratory: Positive for cough.   All other systems reviewed and are negative.     Allergies  Review of patient's allergies indicates no known allergies.  Home Medications   Prior to Admission medications   Medication Sig Start Date End Date Taking? Authorizing Provider  albuterol (PROVENTIL HFA;VENTOLIN HFA) 108 (90 BASE) MCG/ACT inhaler Inhale 1 puff into the lungs every 6 (six) hours as needed for wheezing or shortness of breath. 02/14/14   Velna HatchetSheila May Agustin, NP  ARIPiprazole (ABILIFY) 5 MG tablet Take 1 tablet (5 mg total) by mouth daily.  02/14/14   Velna HatchetSheila May Agustin, NP  benzonatate (TESSALON) 100 MG capsule Take 1 capsule (100 mg total) by mouth every 8 (eight) hours. 02/14/14   Velna HatchetSheila May Agustin, NP  buPROPion (WELLBUTRIN XL) 150 MG 24 hr tablet Take 1 tablet (150 mg total) by mouth daily. 02/14/14   Velna HatchetSheila May Agustin, NP  guaiFENesin (MUCINEX) 600 MG 12 hr tablet Take 2 tablets (1,200 mg total) by mouth 2 (two) times daily. 02/14/14   Velna HatchetSheila May Agustin, NP  nicotine (NICODERM CQ - DOSED IN MG/24 HOURS) 21 mg/24hr patch Place 1 patch (21 mg total) onto the skin daily at 6 (six) AM. 02/14/14   Velna HatchetSheila May Agustin, NP   BP 131/107 mmHg  Pulse 74  Temp(Src) 98.2 F (36.8 C) (Oral)  Resp 20  Ht 5\' 8"  (1.727 m)  Wt 305 lb (138.347 kg)  BMI 46.39 kg/m2  SpO2 97%  LMP 02/03/2014 Physical Exam  Constitutional: She is oriented to person, place, and time. She appears well-developed and well-nourished.  HENT:  Head: Normocephalic.  Eyes: Conjunctivae are normal.  Neck: Neck supple.  Cardiovascular: Normal rate and regular rhythm.   Pulmonary/Chest: Effort normal and breath sounds normal.  Abdominal: Soft. Bowel sounds are normal.  Musculoskeletal: She exhibits no edema or tenderness.  Lymphadenopathy:    She has no cervical adenopathy.  Neurological: She is alert and oriented to person, place, and time.  Skin: Skin is warm and dry.  Psychiatric: She has a normal mood and affect.  Nursing note and vitals reviewed.   ED Course  Procedures (including critical care time) Labs Review Labs Reviewed - No data to display  Imaging Review No results found.   EKG Interpretation None      MDM   Final diagnoses:  None    Viral URI with cough.    Jimmye Normanavid John Shamel Galyean, NP 02/19/14 [id number]  Joya Gaskinsonald W Wickline, MD 02/19/14 [phone]

## 2014-02-23 ENCOUNTER — Ambulatory Visit (HOSPITAL_BASED_OUTPATIENT_CLINIC_OR_DEPARTMENT_OTHER): Payer: Self-pay | Admitting: *Deleted

## 2014-02-23 ENCOUNTER — Ambulatory Visit: Payer: Self-pay | Attending: Family Medicine | Admitting: Family Medicine

## 2014-02-23 ENCOUNTER — Encounter: Payer: Self-pay | Admitting: Family Medicine

## 2014-02-23 DIAGNOSIS — F329 Major depressive disorder, single episode, unspecified: Secondary | ICD-10-CM | POA: Insufficient documentation

## 2014-02-23 DIAGNOSIS — L0591 Pilonidal cyst without abscess: Secondary | ICD-10-CM | POA: Insufficient documentation

## 2014-02-23 DIAGNOSIS — F17200 Nicotine dependence, unspecified, uncomplicated: Secondary | ICD-10-CM | POA: Insufficient documentation

## 2014-02-23 DIAGNOSIS — L700 Acne vulgaris: Secondary | ICD-10-CM

## 2014-02-23 DIAGNOSIS — F419 Anxiety disorder, unspecified: Secondary | ICD-10-CM | POA: Insufficient documentation

## 2014-02-23 DIAGNOSIS — Z23 Encounter for immunization: Secondary | ICD-10-CM

## 2014-02-23 DIAGNOSIS — R45851 Suicidal ideations: Secondary | ICD-10-CM

## 2014-02-23 DIAGNOSIS — Z720 Tobacco use: Secondary | ICD-10-CM

## 2014-02-23 DIAGNOSIS — F411 Generalized anxiety disorder: Secondary | ICD-10-CM

## 2014-02-23 DIAGNOSIS — F332 Major depressive disorder, recurrent severe without psychotic features: Secondary | ICD-10-CM

## 2014-02-23 DIAGNOSIS — Z5900 Homelessness unspecified: Secondary | ICD-10-CM | POA: Insufficient documentation

## 2014-02-23 DIAGNOSIS — L709 Acne, unspecified: Secondary | ICD-10-CM | POA: Insufficient documentation

## 2014-02-23 MED ORDER — CLINDAMYCIN PALMITATE HCL 75 MG/5ML PO SOLR: ORAL | Status: DC

## 2014-02-23 MED ORDER — CLINDAMYCIN PHOSPHATE 1 % EX GEL: Freq: Two times a day (BID) | CUTANEOUS | Status: DC

## 2014-02-23 NOTE — Assessment & Plan Note (Signed)
A: discussed risk and benefits of cyst removal with patient she has opted for removal.  P: F/u in 3 weeks for cyst removal will have patient signs consent on date of procedure.

## 2014-02-23 NOTE — Assessment & Plan Note (Signed)
Resolved

## 2014-02-23 NOTE — Patient Instructions (Signed)
Kimberly Contreras,  Thank you for coming in today.  1. Pilonidal cyst. Scheduled f/u for cyst removal in office.  Working on quitting smoking.   2. Acne: use cleocin ointment twice daily. Get a moisturizer with sunscreen and use once daily in AM, SPF 30 at least. Wash face in evening with mild soap and warm water.   F/u in  3 weeks for cyst removal.   Dr. Armen PickupFunches   Pilonidal Cyst A pilonidal cyst occurs when hairs get trapped (ingrown) beneath the skin in the crease between the buttocks over your sacrum (the bone under that crease). Pilonidal cysts are most common in young men with a lot of body hair. When the cyst is ruptured (breaks) or leaking, fluid from the cyst may cause burning and itching. If the cyst becomes infected, it causes a painful swelling filled with pus (abscess). The pus and trapped hairs need to be removed (often by lancing) so that the infection can heal. However, recurrence is common and an operation may be needed to remove the cyst. HOME CARE INSTRUCTIONS   If the cyst was NOT INFECTED:  Keep the area clean and dry. Bathe or shower daily. Wash the area well with a germ-killing soap. Warm tub baths may help prevent infection and help with drainage. Dry the area well with a towel.  Avoid tight clothing to keep area as moisture free as possible.  Keep area between buttocks as free of hair as possible. A depilatory may be used.  If the cyst WAS INFECTED and needed to be drained:  Your caregiver packed the wound with gauze to keep the wound open. This allows the wound to heal from the inside outwards and continue draining.  Return for a wound check in 1 day or as suggested.  If you take tub baths or showers, repack the wound with gauze following them. Sponge baths (at the sink) are a good alternative.  If an antibiotic was ordered to fight the infection, take as directed.  Only take over-the-counter or prescription medicines for pain, discomfort, or fever as directed by  your caregiver.  After the drain is removed, use sitz baths for 20 minutes 4 times per day. Clean the wound gently with mild unscented soap, pat dry, and then apply a dry dressing. SEEK MEDICAL CARE IF:   You have increased pain, swelling, redness, drainage, or bleeding from the area.  You have a fever.  You have muscles aches, dizziness, or a general ill feeling. Document Released: 03/01/2000 Document Revised: 05/27/2011 Document Reviewed: 04/29/2008 [facility] Patient Information 2015 [place], [place]. This information is not intended to replace advice given to you by your health care provider. Make sure you discuss any questions you have with your health care provider.

## 2014-02-23 NOTE — Addendum Note (Signed)
Addended by: Dessa PhiFUNCHES, Micheale Schlack on: 02/23/2014 05:23 PM   Modules accepted: Orders, Medications

## 2014-02-23 NOTE — Assessment & Plan Note (Signed)
Smoking cessation  

## 2014-02-23 NOTE — Progress Notes (Signed)
Establish Care Stated has cyst on lower back Swelling  with heat and pain

## 2014-02-23 NOTE — Progress Notes (Signed)
   Subjective:    Patient ID: Kimberly Contreras, female    DOB: [birth year], 28 y.o.   MRN: [id number] CC: pilonidal cyst  HPI 28 yo homeless female presents to establish care and discuss the following:  1. Pilonidal cyst: since 2010. Intermittently swells and drains about every month. No swelling or drainage now.   2. Acne: chronic problem. Patient reports worsening symptoms during menses. Has tried proactive and clearasil w/o improvement.   Soc Hx: current smoker, 1/2 PPD  Med Hx; depression and anxiety[facility]: appendectomy in 2012 Review of Systems As per HPI     Objective:   Physical Exam BP 134/79 mmHg  Pulse 77  Temp(Src) 98.1 F (36.7 C) (Oral)  Resp 18  Ht 5\' 7"  (1.702 m)  Wt 315 lb (142.883 kg)  BMI 49.32 kg/m2  SpO2 99%  LMP 02/03/2014 General appearance: alert, cooperative, no distress and morbidly obese Lungs: clear to auscultation bilaterally Heart: regular rate and rhythm, S1, S2 normal, no murmur, click, rub or gallop Extremities: extremities normal, atraumatic, no cyanosis or edema Skin: 1x1 cm superficial cyst in superior gluteal cleft. Mild tenderness. No erythema or fluctuance.   Few papules on face (R cheek) with mild hisutism along chin and jaw line.     Assessment & Plan:

## 2014-02-23 NOTE — Assessment & Plan Note (Signed)
A: mild acne vulgaris P: Cleocin

## 2014-03-16 ENCOUNTER — Ambulatory Visit: Payer: Self-pay | Admitting: Family Medicine

## 2014-03-19 ENCOUNTER — Emergency Department (HOSPITAL_COMMUNITY)
Admission: EM | Admit: 2014-03-19 | Discharge: 2014-03-19 | Disposition: A | Discharge status: Home / Self Care | Payer: Self-pay | Attending: Emergency Medicine | Admitting: Emergency Medicine

## 2014-03-19 ENCOUNTER — Encounter (HOSPITAL_COMMUNITY): Payer: Self-pay | Admitting: *Deleted

## 2014-03-19 DIAGNOSIS — Z720 Tobacco use: Secondary | ICD-10-CM | POA: Insufficient documentation

## 2014-03-19 DIAGNOSIS — Z792 Long term (current) use of antibiotics: Secondary | ICD-10-CM | POA: Insufficient documentation

## 2014-03-19 DIAGNOSIS — E669 Obesity, unspecified: Secondary | ICD-10-CM | POA: Insufficient documentation

## 2014-03-19 DIAGNOSIS — M25561 Pain in right knee: Secondary | ICD-10-CM | POA: Insufficient documentation

## 2014-03-19 DIAGNOSIS — M79604 Pain in right leg: Secondary | ICD-10-CM

## 2014-03-19 DIAGNOSIS — M79661 Pain in right lower leg: Secondary | ICD-10-CM | POA: Insufficient documentation

## 2014-03-19 DIAGNOSIS — Z8659 Personal history of other mental and behavioral disorders: Secondary | ICD-10-CM | POA: Insufficient documentation

## 2014-03-19 DIAGNOSIS — Z872 Personal history of diseases of the skin and subcutaneous tissue: Secondary | ICD-10-CM | POA: Insufficient documentation

## 2014-03-19 MED ORDER — NAPROXEN 500 MG PO TABS: 500.0000 mg | ORAL_TABLET | Freq: Two times a day (BID) | ORAL | Status: DC

## 2014-03-19 MED ORDER — KETOROLAC TROMETHAMINE 60 MG/2ML IM SOLN
60.0000 mg | Freq: Once | INTRAMUSCULAR | Status: AC
  Administered 2014-03-19: 60 mg via INTRAMUSCULAR
  Filled 2014-03-19: qty 2

## 2014-03-19 NOTE — ED Notes (Signed)
Declined W/C at D/C and was escorted to lobby by RN. 

## 2014-03-19 NOTE — ED Provider Notes (Signed)
CSN: [id number]     Arrival date & time 03/19/14  1259 History  This chart was scribed for non-physician practitioner, Joycie Peek, PA-C working with Gwyneth Sprout, MD, by Jarvis Morgan, ED Scribe. This patient was seen in room TR06C/TR06C and the patient's care was started at 2:28 PM.     Chief Complaint  Patient presents with  . Leg Pain    The history is provided by the patient. No language interpreter was used.    HPI Comments: Kimberly Contreras is a 29 y.o. obese female with a h/o anxiety and depression who presents to the Emergency Department complaining of right lower leg pain that began 2 days ago. Pt states that the pain began when she was walking and she felt the muscles in her legs "tighten".  She is having associated pain in the right knee. She denies any injury to the area. Pt states she had a previous injury to the right leg about 3 years ago but denies any complications. She has taken Tylenol and Advil with no relief. No h/o DVT. Pt denies any recent travel. No recent surgeries. She is not on any BC medication. Pt is able to walk but she notes she had been walking more than usual when her leg began to cause her pain. She denies any hemoptysis, swelling, shortness of breath, color change, redness, numbness, weakness, tingling, nausea, vomiting, diarrhea, or constipation.   PCP: Lora Paula, MD   Past Medical History  Diagnosis Date  . Abscess   . Obesity   . Anxiety Dx 2013  . Depression Dx 2013   Past Surgical History  Procedure Laterality Date  . Appendectomy  2013    Family History  Problem Relation Age of Onset  . Cancer Other     breast in [facility], great aunt   . Hypertension Other   . Diabetes Other    History  Substance Use Topics  . Smoking status: Current Every Day Smoker -- 0.50 packs/day for 11 years    Types: Cigarettes  . Smokeless tobacco: Never Used  . Alcohol Use: No   OB History    No data available     Review of Systems   Respiratory: Negative for cough (or hemoptysis) and shortness of breath.   Cardiovascular: Negative for chest pain and leg swelling.  Gastrointestinal: Negative for nausea, vomiting, abdominal pain, diarrhea and constipation.  Musculoskeletal: Positive for myalgias (right leg) and arthralgias (right leg and right knee). Negative for joint swelling and gait problem.  Skin: Negative for color change.  Neurological: Negative for weakness and numbness.  All other systems reviewed and are negative.     Allergies  Review of patient's allergies indicates no known allergies.  Home Medications   Prior to Admission medications   Medication Sig Start Date End Date Taking? Authorizing Provider  clindamycin (CLINDAGEL) 1 % gel Apply topically 2 (two) times daily. 02/23/14   Lora Paula, MD   Triage Vitals: BP 130/83 mmHg  Pulse 76  Temp(Src) 98 F (36.7 C) (Oral)  Resp 20  SpO2 100%  LMP 02/01/2014  Physical Exam  Constitutional: She is oriented to person, place, and time. She appears well-developed and well-nourished. No distress.  HENT:  Head: Normocephalic and atraumatic.  Eyes: Conjunctivae and EOM are normal.  Neck: Neck supple. No tracheal deviation present.  Cardiovascular: Normal rate, regular rhythm, normal heart sounds and intact distal pulses.   Pulmonary/Chest: Effort normal and breath sounds normal. No respiratory distress.  Musculoskeletal: Normal  range of motion. She exhibits tenderness. She exhibits no edema.  Tenderness over lateral aspect of right calf w/o overt edema or erythema. No obvious lesions. Distal pulses intact. NVI. Able to flex and extend large joints of bil LE against resistance.No tenderness along deep venous system. No palpable cords.   Neurological: She is alert and oriented to person, place, and time.  Skin: Skin is warm and dry.  Psychiatric: She has a normal mood and affect. Her behavior is normal.  Nursing note and vitals reviewed.   ED  Course  Procedures (including critical care time)  DIAGNOSTIC STUDIES: Oxygen Saturation is 100% on RA, normal by my interpretation.    COORDINATION OF CARE:    Labs Review Labs Reviewed - No data to display  Imaging Review No results found.   EKG Interpretation None     Meds given in ED:  Medications  ketorolac (TORADOL) injection 60 mg (60 mg Intramuscular Given 03/19/14 1457)    New Prescriptions   NAPROXEN (NAPROSYN) 500 MG TABLET    Take 1 tablet (500 mg total) by mouth 2 (two) times daily.   Filed Vitals:   03/19/14 1318  BP: 130/83  Pulse: 76  Temp: 98 F (36.7 C)  TempSrc: Oral  Resp: 20  SpO2: 100%    MDM  Vitals stable - WNL -afebrile Pt resting comfortably in ED.  PE--no overt erythema, swelling to right leg. There is tenderness on the lateral aspect of calf. No palpable cords. No overt warmth. Distal pulses intact, neurovascularly intact. Gait is baseline without ataxia.  DDX--this is likely a musculoskeletal strain as patient admits she was walking more than usual at the time of discomfort onset. Wells DVT negative. Low concern for DVT, necrotizing fasciitis, other emergent vascular insufficiency. Will DC with naproxen and instructions to follow-up with primary care and to return to ED for worsening symptoms. Discussed f/u with PCP and return precautions, pt very amenable to plan. Patient stable, in good condition and is appropriate for discharge   Final diagnoses:  Pain of right lower extremity     I personally performed the services described in this documentation, which was scribed in my presence. The recorded information has been reviewed and is accurate.    Earle Gell Patterson, PA-C 03/19/14 1627  Gwyneth Sprout, MD 03/21/14 [phone]

## 2014-03-19 NOTE — Discharge Instructions (Signed)
Is important if you to follow-up with primary care for further evaluation and management of your symptoms. Please return to ED for worsening symptoms, changes in skin color, swelling in her leg, tenderness in your calf, difficulties breathing or shortness of breath. Please take the naproxen as directed for musculoskeletal pain you may experience.

## 2014-03-19 NOTE — ED Notes (Addendum)
Pt reports walking the other day and then began having right lower leg pain and knee, denies injury. Ambulatory at triage.

## 2014-03-23 ENCOUNTER — Ambulatory Visit: Payer: Self-pay | Attending: Family Medicine

## 2014-03-28 ENCOUNTER — Other Ambulatory Visit: Payer: Self-pay | Admitting: Family Medicine

## 2014-03-29 ENCOUNTER — Other Ambulatory Visit: Payer: Self-pay | Admitting: Family Medicine

## 2014-04-01 ENCOUNTER — Telehealth: Payer: Self-pay | Admitting: *Deleted

## 2014-04-01 ENCOUNTER — Other Ambulatory Visit: Payer: Self-pay | Admitting: Family Medicine

## 2014-04-01 DIAGNOSIS — L700 Acne vulgaris: Secondary | ICD-10-CM

## 2014-04-01 MED ORDER — CLINDAMYCIN PHOSPHATE 1 % EX GEL: Freq: Two times a day (BID) | CUTANEOUS | Status: DC

## 2014-04-01 NOTE — Telephone Encounter (Signed)
Requesting.

## 2014-04-01 NOTE — Telephone Encounter (Signed)
Rx Clindamycin gel refilled send to [facility]

## 2014-04-12 ENCOUNTER — Ambulatory Visit: Payer: Self-pay | Attending: Family Medicine | Admitting: Family Medicine

## 2014-04-12 DIAGNOSIS — L0591 Pilonidal cyst without abscess: Secondary | ICD-10-CM | POA: Insufficient documentation

## 2014-04-12 MED ORDER — DOXYCYCLINE HYCLATE 100 MG PO TABS: 100.0000 mg | ORAL_TABLET | Freq: Two times a day (BID) | ORAL | Status: DC

## 2014-04-12 MED ORDER — TRAMADOL HCL 50 MG PO TABS: 50.0000 mg | ORAL_TABLET | Freq: Three times a day (TID) | ORAL | Status: DC | PRN

## 2014-04-12 NOTE — Patient Instructions (Addendum)
Kimberly Contreras,  Thank you for coming in today. Keep wound clean and dry. If you develop pain, swelling, foul discharge call and come in.  Doxycycline 100 mg twice daily to prevent infection tamadol 1-2 tabs as needed for pain.   F/u on Thursday or Friday (2-3 days) with me for wound check and packing removal   Dr. Armen PickupFunches

## 2014-04-12 NOTE — Progress Notes (Signed)
Patient ID: Kimberly Contreras, female   DOB: 20-Jan-1986, 29 y.o.   MRN: [id number] Sebaceous Cyst Excision Procedure Note  Pre-operative Diagnosis: pilonidal cyst   Post-operative Diagnosis: deep gluteal cystic cavity   Locations:superior gluteal cleft  Indications: recurrent abscess   Anesthesia: Lidocaine 2% with epinephrine without added sodium bicarbonate 8 cc  Procedure Details  History of allergy to iodine: no  Patient informed of the risks (including bleeding and infection) and benefits of the  procedure and Written informed consent obtained.  The lesion and surrounding area was given a sterile prep using alcohol and draped in the usual sterile fashion. A 1.5 cm incision was made over the cyst, a small cystic area was noted and excised. However, deep to the cystic area was a deep hollow cavity. About 3 cm deep. The wound was packed with gauze.  Antibiotic ointment and a sterile dressing applied.  The specimen was not sent for pathologic examination. The patient tolerated the procedure well.  EBL: 5 ml  Findings: Small pilonidal cyst with large underlying cavity   Condition: Stable  Complications: none.  Plan: 1. Instructed to keep the wound dry and covered for 24-48h and clean thereafter. 2. Warning signs of infection were reviewed.  Patient prescribed doxycyline for infection prevention.  3. Recommended that the patient use Ultram as needed for pain.  4. Return for wound check and packing removal in 2-3 days

## 2014-04-12 NOTE — Assessment & Plan Note (Signed)
A: cyst removal today, large underlying cystic cavity noted. Likely source of recurrent abscess formation. Area was packed to healing from inside out.  P: Plan: 1. Instructed to keep the wound dry and covered for 24-48h and clean thereafter. 2. Warning signs of infection were reviewed.  Patient prescribed doxycyline for infection prevention.  3. Recommended that the patient use Ultram as needed for pain.  4. Return for wound check and packing removal in 2-3 days

## 2014-04-15 ENCOUNTER — Ambulatory Visit: Payer: Self-pay | Admitting: Family Medicine

## 2014-04-27 ENCOUNTER — Encounter: Payer: Self-pay | Admitting: Family Medicine

## 2014-04-27 ENCOUNTER — Ambulatory Visit: Payer: Self-pay | Attending: Family Medicine | Admitting: Family Medicine

## 2014-04-27 VITALS — BP 121/72 | HR 71 | Temp 98.2°F | Resp 16 | Ht 67.0 in | Wt 320.0 lb

## 2014-04-27 DIAGNOSIS — Z48817 Encounter for surgical aftercare following surgery on the skin and subcutaneous tissue: Secondary | ICD-10-CM | POA: Insufficient documentation

## 2014-04-27 DIAGNOSIS — L0591 Pilonidal cyst without abscess: Secondary | ICD-10-CM

## 2014-04-27 MED ORDER — DIPHENHYDRAMINE HCL 25 MG PO TABS: 25.0000 mg | ORAL_TABLET | Freq: Every evening | ORAL | Status: DC | PRN

## 2014-04-27 MED ORDER — DIPHENHYDRAMINE HCL 25 MG PO TABS: 25.0000 mg | ORAL_TABLET | Freq: Four times a day (QID) | ORAL | Status: DC | PRN

## 2014-04-27 MED ORDER — CETIRIZINE HCL 10 MG PO TABS: 10.0000 mg | ORAL_TABLET | Freq: Every day | ORAL | Status: DC

## 2014-04-27 NOTE — Progress Notes (Signed)
Patient here for sacral wound check Patient took one week of her two week course of antibiotics because she says it "made me sick" Patient reports itching at wound site and would like something to help with that

## 2014-04-27 NOTE — Assessment & Plan Note (Addendum)
A: healing wound following cyst removal, patient experiencing itching P: Keep wound covered with band aid and dry Use cetirizine 10 mg daily as needed for itching Use benadryl 25 mg at night as needed for itching.

## 2014-04-27 NOTE — Progress Notes (Signed)
   Subjective:    Patient ID: Kimberly Contreras, female    DOB: 08/18/1985, 29 y.o.   MRN: [id number] CC; wound check  HPI 29 yo F presents for wound check.   1. Wound check: following pilonidal cyst removal. Having some itching and mid drainage in the area. No pain. No fever. She took most of the doxycyline but reports it upset her stomach.   Soc Hx: current smoker   Review of Systems As per HPI     Objective:   Physical Exam BP 121/72 mmHg  Pulse 71  Temp(Src) 98.2 F (36.8 C)  Resp 16  Ht 5\' 7"  (1.702 m)  Wt 320 lb (145.151 kg)  BMI 50.11 kg/m2  SpO2 100%  LMP 04/03/2014 General appearance: alert, cooperative and no distress Skin: 1x1 cm superficial wound in sacral area, mild induration, no fluctuance, erythema or drainage.      Assessment & Plan:

## 2014-04-27 NOTE — Patient Instructions (Addendum)
Kimberly Contreras,  Thank you for coming in today. Wound is healing.  Itching and hardness (induration) is a natural response to healing.   Keep wound covered with band aid and dry Use cetirizine 10 mg daily as needed for itching Use benadryl 25 mg at night as needed for itching.    F/u if you develop swelling and drainage like before which would indicate that the capsule was not completely removed and you will need referral to surgery.   Dr. Armen PickupFunches

## 2014-05-23 ENCOUNTER — Other Ambulatory Visit: Payer: Self-pay | Admitting: Family Medicine

## 2014-06-21 ENCOUNTER — Telehealth: Payer: Self-pay | Admitting: Family Medicine

## 2014-06-21 ENCOUNTER — Other Ambulatory Visit: Payer: Self-pay | Admitting: Family Medicine

## 2014-06-21 DIAGNOSIS — L700 Acne vulgaris: Secondary | ICD-10-CM

## 2014-06-21 NOTE — Telephone Encounter (Signed)
Patient would like to request a refill for clindamycin (CLINDAGEL) 1 % gel . Please f/u with pt.

## 2014-06-23 MED ORDER — CLINDAMYCIN PHOSPHATE 1 % EX GEL: Freq: Two times a day (BID) | CUTANEOUS | Status: DC

## 2014-06-23 NOTE — Addendum Note (Signed)
Addended by: Dessa PhiFUNCHES, Leslieanne Cobarrubias on: 06/23/2014 01:39 PM   Modules accepted: Orders

## 2014-06-23 NOTE — Telephone Encounter (Signed)
Refill sent in

## 2014-06-24 NOTE — Telephone Encounter (Signed)
Left voice message to return call  Rx at [facility] 

## 2014-06-28 ENCOUNTER — Ambulatory Visit: Payer: Self-pay | Admitting: Family Medicine

## 2014-08-26 ENCOUNTER — Telehealth: Payer: Self-pay | Admitting: Family Medicine

## 2014-08-26 NOTE — Telephone Encounter (Signed)
Noted  

## 2014-08-26 NOTE — Telephone Encounter (Signed)
Patient has come in today to schedule and appointment but has "no showed" three times; per policy patient is no longer able to schedule an appointment but has to walk-in and wait for an available slot for a provider; patient was made aware and verbalized understanding;

## 2014-08-30 ENCOUNTER — Emergency Department (HOSPITAL_COMMUNITY)
Admission: EM | Admit: 2014-08-30 | Discharge: 2014-08-30 | Disposition: A | Discharge status: Home / Self Care | Payer: Self-pay | Attending: Emergency Medicine | Admitting: Emergency Medicine

## 2014-08-30 ENCOUNTER — Encounter (HOSPITAL_COMMUNITY): Payer: Self-pay | Admitting: Emergency Medicine

## 2014-08-30 DIAGNOSIS — E669 Obesity, unspecified: Secondary | ICD-10-CM | POA: Insufficient documentation

## 2014-08-30 DIAGNOSIS — B3731 Acute candidiasis of vulva and vagina: Secondary | ICD-10-CM

## 2014-08-30 DIAGNOSIS — R34 Anuria and oliguria: Secondary | ICD-10-CM | POA: Insufficient documentation

## 2014-08-30 DIAGNOSIS — Z8659 Personal history of other mental and behavioral disorders: Secondary | ICD-10-CM | POA: Insufficient documentation

## 2014-08-30 DIAGNOSIS — Z792 Long term (current) use of antibiotics: Secondary | ICD-10-CM | POA: Insufficient documentation

## 2014-08-30 DIAGNOSIS — Z872 Personal history of diseases of the skin and subcutaneous tissue: Secondary | ICD-10-CM | POA: Insufficient documentation

## 2014-08-30 DIAGNOSIS — B373 Candidiasis of vulva and vagina: Secondary | ICD-10-CM | POA: Insufficient documentation

## 2014-08-30 DIAGNOSIS — Z720 Tobacco use: Secondary | ICD-10-CM | POA: Insufficient documentation

## 2014-08-30 LAB — WET PREP, GENITAL
Clue Cells Wet Prep HPF POC: NONE SEEN
Trich, Wet Prep: NONE SEEN

## 2014-08-30 LAB — COMPREHENSIVE METABOLIC PANEL
ALBUMIN: 2.8 g/dL — AB (ref 3.5–5.0)
ALT: 10 U/L — AB (ref 14–54)
AST: 14 U/L — ABNORMAL LOW (ref 15–41)
Alkaline Phosphatase: 44 U/L (ref 38–126)
Anion gap: 6 (ref 5–15)
BUN: <5 mg/dL — ABNORMAL LOW (ref 6–20)
CO2: 27 mmol/L (ref 22–32)
Calcium: 8.5 mg/dL — ABNORMAL LOW (ref 8.9–10.3)
Chloride: 106 mmol/L (ref 101–111)
Creatinine, Ser: 0.76 mg/dL (ref 0.44–1.00)
GFR calc Af Amer: >60 mL/min (ref >60)
GFR calc non Af Amer: >60 mL/min (ref >60)
Glucose, Bld: 105 mg/dL — ABNORMAL HIGH (ref 65–99)
Potassium: 3.4 mmol/L — ABNORMAL LOW (ref 3.5–5.1)
Sodium: 139 mmol/L (ref 135–145)
TOTAL PROTEIN: 5.1 g/dL — AB (ref 6.5–8.1)
Total Bilirubin: 0.5 mg/dL (ref 0.3–1.2)

## 2014-08-30 LAB — URINALYSIS, ROUTINE W REFLEX MICROSCOPIC
Glucose, UA: NEGATIVE mg/dL
Hgb urine dipstick: NEGATIVE
KETONES UR: 15 mg/dL — AB
Leukocytes, UA: NEGATIVE
Nitrite: NEGATIVE
PH: 5.5 (ref 5.0–8.0)
PROTEIN: NEGATIVE mg/dL
Specific Gravity, Urine: 1.028 (ref 1.005–1.030)
Urobilinogen, UA: 1 mg/dL (ref 0.0–1.0)

## 2014-08-30 MED ORDER — FLUCONAZOLE 100 MG PO TABS
150.0000 mg | ORAL_TABLET | Freq: Once | ORAL | Status: AC
  Administered 2014-08-30: 150 mg via ORAL
  Filled 2014-08-30: qty 2

## 2014-08-30 NOTE — ED Notes (Signed)
Urine specimen walked down to lab.

## 2014-08-30 NOTE — ED Notes (Signed)
Pt does not want to change into gown. 

## 2014-08-30 NOTE — Discharge Instructions (Signed)
Vaginitis Vaginitis is an inflammation of the vagina. It can happen when the normal bacteria and yeast in the vagina grow too much. There are different types. Treatment will depend on the type you have. HOME CARE  Take all medicines as told by your doctor.  Keep your vagina area clean and dry. Avoid soap. Rinse the area with water.  Avoid washing and cleaning out the vagina (douching).  Do not use tampons or have sex (intercourse) until your treatment is done.  Wipe from front to back after going to the restroom.  Wear cotton underwear.  Avoid wearing underwear while you sleep until your vaginitis is gone.  Avoid tight pants. Avoid underwear or nylons without a cotton panel.  Take off wet clothing (such as a bathing suit) as soon as you can.  Use mild, unscented products. Avoid fabric softeners and scented:  Feminine sprays.  Laundry detergents.  Tampons.  Soaps or bubble baths.  Practice safe sex and use condoms. GET HELP RIGHT AWAY IF:   You have belly (abdominal) pain.  You have a fever or lasting symptoms for more than 2-3 days.  You have a fever and your symptoms suddenly get worse. MAKE SURE YOU:   Understand these instructions.  Will watch this condition.  Will get help right away if you are not doing well or get worse. Document Released: 05/31/2008 Document Revised: 11/27/2011 Document Reviewed: 08/15/2011 ExitCare Patient Information 2015 [employer]. This information is not intended to replace advice given to you by your health care provider. Make sure you discuss any questions you have with your health care provider.  

## 2014-08-30 NOTE — ED Notes (Signed)
Pt c/o burning with urination and decrease urine output for 2 weeks. Pt denies vaginal discharge or odor.

## 2014-08-30 NOTE — ED Provider Notes (Signed)
CSN: [id number]     Arrival date & time 08/30/14  1008 History   First MD Initiated Contact with Patient 08/30/14 1022     Chief Complaint  Patient presents with  . Urinary Tract Infection      HPI  She presents for evaluation of burning with urination. Symptoms for the last 2 weeks. Feels like she is urinating less than normal. States her urine is "dark" states it is "dark yellow, not tea like. Does not have urinary frequency. No hematuria. No fevers chills. She denies vaginal bleeding or discharge. Describes an odor.  Past Medical History  Diagnosis Date  . Abscess   . Obesity   . Anxiety Dx 2013  . Depression Dx 2013   Past Surgical History  Procedure Laterality Date  . Appendectomy  2013    Family History  Problem Relation Age of Onset  . Cancer Other     breast in [facility], great aunt   . Hypertension Other   . Diabetes Other    History  Substance Use Topics  . Smoking status: Current Every Day Smoker -- 0.50 packs/day for 11 years    Types: Cigarettes  . Smokeless tobacco: Never Used  . Alcohol Use: No   OB History    No data available     Review of Systems  Constitutional: Negative for fever, chills, diaphoresis, appetite change and fatigue.  HENT: Negative for mouth sores, sore throat and trouble swallowing.   Eyes: Negative for visual disturbance.  Respiratory: Negative for cough, chest tightness, shortness of breath and wheezing.   Cardiovascular: Negative for chest pain.  Gastrointestinal: Negative for nausea, vomiting, abdominal pain, diarrhea and abdominal distention.  Endocrine: Negative for polydipsia, polyphagia and polyuria.  Genitourinary: Positive for dysuria and decreased urine volume. Negative for frequency and hematuria.  Musculoskeletal: Negative for gait problem.  Skin: Negative for color change, pallor and rash.  Neurological: Negative for dizziness, syncope, light-headedness and headaches.  Hematological: Does not bruise/bleed easily.   Psychiatric/Behavioral: Negative for behavioral problems and confusion.      Allergies  Review of patient's allergies indicates no known allergies.  Home Medications   Prior to Admission medications   Medication Sig Start Date End Date Taking? Authorizing Provider  clindamycin (CLINDAGEL) 1 % gel Apply topically 2 (two) times daily. 06/23/14  Yes Josalyn Funches, MD  cetirizine (ZYRTEC) 10 MG tablet Take 1 tablet (10 mg total) by mouth daily. Patient not taking: Reported on 08/30/2014 04/27/14   Dessa Phi, MD  diphenhydrAMINE (BENADRYL) 25 MG tablet Take 1 tablet (25 mg total) by mouth at bedtime as needed for itching. Patient not taking: Reported on 08/30/2014 04/27/14   Josalyn Funches, MD   BP 110/78 mmHg  Pulse 91  Temp(Src) 97.9 F (36.6 C) (Oral)  Resp 16  Ht 5\' 7"  (1.702 m)  Wt 308 lb (139.708 kg)  BMI 48.23 kg/m2  SpO2 99%  LMP 08/02/2014 (Exact Date) Physical Exam  Constitutional: She is oriented to person, place, and time. She appears well-developed and well-nourished. No distress.  HENT:  Head: Normocephalic.  Eyes: Conjunctivae are normal. Pupils are equal, round, and reactive to light. No scleral icterus.  Neck: Normal range of motion. Neck supple. No thyromegaly present.  Cardiovascular: Normal rate and regular rhythm.  Exam reveals no gallop and no friction rub.   No murmur heard. Pulmonary/Chest: Effort normal and breath sounds normal. No respiratory distress. She has no wheezes. She has no rales.  Abdominal: Soft. Bowel sounds are normal.  She exhibits no distension. There is no tenderness. There is no rebound.  Musculoskeletal: Normal range of motion.  Neurological: She is alert and oriented to person, place, and time.  Skin: Skin is warm and dry. No rash noted.  Psychiatric: She has a normal mood and affect. Her behavior is normal.    ED Course  Procedures (including critical care time) Labs Review Labs Reviewed  WET PREP, GENITAL - Abnormal;  Notable for the following:    Yeast Wet Prep HPF POC FEW (*)    WBC, Wet Prep HPF POC MODERATE (*)    All other components within normal limits  URINALYSIS, ROUTINE W REFLEX MICROSCOPIC (NOT AT [facility]) - Abnormal; Notable for the following:    Color, Urine AMBER (*)    Bilirubin Urine SMALL (*)    Ketones, ur 15 (*)    All other components within normal limits  COMPREHENSIVE METABOLIC PANEL - Abnormal; Notable for the following:    Potassium 3.4 (*)    Glucose, Bld 105 (*)    BUN <5 (*)    Calcium 8.5 (*)    Total Protein 5.1 (*)    Albumin 2.8 (*)    AST 14 (*)    ALT 10 (*)    All other components within normal limits  GC/CHLAMYDIA PROBE AMP ([place]) NOT AT [facility]    Imaging Review No results found.   EKG Interpretation None      MDM   Final diagnoses:  Yeast vaginitis    Exam shows some white discharge. Does not appear inflamed. Yeast on swab. Given Diflucan. Discharge home.    Rolland Porter, MD 08/30/14 1410

## 2014-08-30 NOTE — ED Notes (Signed)
The pelvic cart is ready and in the patients room.

## 2014-08-30 NOTE — ED Notes (Signed)
Patient is asking for a snack.

## 2014-08-31 LAB — GC/CHLAMYDIA PROBE AMP (~~LOC~~) NOT AT ARMC
Chlamydia: NEGATIVE
Neisseria Gonorrhea: NEGATIVE

## 2014-09-14 ENCOUNTER — Encounter: Payer: Self-pay | Admitting: Family Medicine

## 2014-09-14 ENCOUNTER — Ambulatory Visit: Payer: Self-pay | Attending: Family Medicine | Admitting: Family Medicine

## 2014-09-14 ENCOUNTER — Ambulatory Visit: Payer: Self-pay

## 2014-09-14 DIAGNOSIS — L0591 Pilonidal cyst without abscess: Secondary | ICD-10-CM | POA: Insufficient documentation

## 2014-09-14 NOTE — Progress Notes (Signed)
F/U abscess on bottom  No pain, no discharge

## 2014-09-14 NOTE — Patient Instructions (Signed)
Ms. Tawanna Sololder,  Thank you for coming in today  Pilonidal cyst: General surgery referral [facility] today [facility] will [facility] you with details  F/u as needed  Dr. Armen PickupFunches

## 2014-09-14 NOTE — Assessment & Plan Note (Signed)
Pilonidal cyst: General surgery referral placed today you [facility], the referral coordinator will contact you with details

## 2014-09-14 NOTE — Progress Notes (Signed)
   Subjective:    Patient ID: Kimberly Contreras, female    DOB: [birth year], 29 y.o.   MRN: [id number] CC; f/u abscess on bottom  HPI 29 yo F presents for f/u:  1. Pilonidal cyst: intermittently swells and drainage. No pain. No fever. Interested in referral to derm or surgery for definitive removal. No swelling, pain or drainage now.   Soc Hx: current smoker  Review of Systems  Constitutional: Negative for fever and chills.  Musculoskeletal: Negative for myalgias.      Objective:   Physical Exam BP 122/79 mmHg  Pulse 74  Temp(Src) 98.6 F (37 C) (Oral)  Resp 18  Ht 5\' 7"  (1.702 m)  Wt 311 lb (141.069 kg)  BMI 48.70 kg/m2  SpO2 99%  LMP 07/31/2014  Wt Readings from Last 3 Encounters:  09/14/14 311 lb (141.069 kg)  08/30/14 308 lb (139.708 kg)  04/27/14 320 lb (145.151 kg)  General appearance: alert, cooperative and no distress Skin: superior gluteal cleft with small < 1 cm area of induration, superficial dimple in center of induration      Assessment & Plan:

## 2015-03-23 ENCOUNTER — Encounter (HOSPITAL_COMMUNITY): Payer: Self-pay | Admitting: Emergency Medicine

## 2015-03-23 ENCOUNTER — Emergency Department (HOSPITAL_COMMUNITY)
Admission: EM | Admit: 2015-03-23 | Discharge: 2015-03-24 | Disposition: A | Discharge status: Home / Self Care | Payer: Self-pay | Attending: Emergency Medicine | Admitting: Emergency Medicine

## 2015-03-23 DIAGNOSIS — Z8659 Personal history of other mental and behavioral disorders: Secondary | ICD-10-CM | POA: Insufficient documentation

## 2015-03-23 DIAGNOSIS — H9202 Otalgia, left ear: Secondary | ICD-10-CM | POA: Insufficient documentation

## 2015-03-23 DIAGNOSIS — J029 Acute pharyngitis, unspecified: Secondary | ICD-10-CM | POA: Insufficient documentation

## 2015-03-23 DIAGNOSIS — Z872 Personal history of diseases of the skin and subcutaneous tissue: Secondary | ICD-10-CM | POA: Insufficient documentation

## 2015-03-23 DIAGNOSIS — E669 Obesity, unspecified: Secondary | ICD-10-CM | POA: Insufficient documentation

## 2015-03-23 DIAGNOSIS — F17210 Nicotine dependence, cigarettes, uncomplicated: Secondary | ICD-10-CM | POA: Insufficient documentation

## 2015-03-23 LAB — RAPID STREP SCREEN (MED CTR MEBANE ONLY): Streptococcus, Group A Screen (Direct): NEGATIVE

## 2015-03-23 MED ORDER — IBUPROFEN 100 MG/5ML PO SUSP
800.0000 mg | Freq: Once | ORAL | Status: DC
  Filled 2015-03-23: qty 40

## 2015-03-23 MED ORDER — ACETAMINOPHEN 160 MG/5ML PO SOLN
650.0000 mg | Freq: Once | ORAL | Status: AC
  Administered 2015-03-23: 650 mg via ORAL
  Filled 2015-03-23: qty 20.3

## 2015-03-23 MED ORDER — IBUPROFEN 400 MG PO TABS
600.0000 mg | ORAL_TABLET | Freq: Once | ORAL | Status: AC
  Administered 2015-03-23: 600 mg via ORAL
  Filled 2015-03-23: qty 1

## 2015-03-23 NOTE — ED Provider Notes (Signed)
CSN: [id number]     Arrival date & time 03/23/15  2016 History   First MD Initiated Contact with Patient 03/23/15 2240     Chief Complaint  Patient presents with  . Headache  . Neck Pain     (Consider location/radiation/quality/duration/timing/severity/associated sxs/prior Treatment) HPI Comments: Patient states that she was eating chicken when she suddenly felt a pop in the left side of her neck pain radiating to her left ear.  She has a mild headache.  This happened at dinnertime.  She did not take any medication for her symptoms.  They have not extra weaned in intensity.  She states when she swallows it hurts.  Denies any fever or chills, nasal congestion she's had a persistent cough since before Christmas.  Has no history of asthma.  Patient is a 30 y.o. female presenting with headaches and neck pain. The history is provided by the patient.  Headache Quality:  Unable to specify Radiates to:  Does not radiate Severity currently:  2/10 Onset quality:  Gradual Associated symptoms: cough, ear pain and neck pain   Associated symptoms: no fever   Neck Pain Pain location:  L side Quality:  Stabbing Radiates to: L ear. Pain severity:  Mild Onset quality:  Sudden Relieved by:  None tried Worsened by:  Nothing tried Ineffective treatments:  None tried Associated symptoms: headaches   Associated symptoms: no fever     Past Medical History  Diagnosis Date  . Abscess   . Obesity   . Anxiety Dx 2013  . Depression Dx 2013   Past Surgical History  Procedure Laterality Date  . Appendectomy  2013    Family History  Problem Relation Age of Onset  . Cancer Other     [facility], great aunt   . Hypertension Other   . Diabetes Other    Social History  Substance Use Topics  . Smoking status: Current Every Day Smoker -- 0.00 packs/day for 11 years    Types: Cigarettes  . Smokeless tobacco: Never Used  . Alcohol Use: No   OB History    No data available     Review of  Systems  Constitutional: Negative for fever and chills.  HENT: Positive for ear pain and trouble swallowing.   Respiratory: Positive for cough. Negative for shortness of breath.   Musculoskeletal: Positive for neck pain.  Neurological: Positive for headaches.  All other systems reviewed and are negative.     Allergies  Review of patient's allergies indicates no known allergies.  Home Medications   Prior to Admission medications   Not on File   BP 114/58 mmHg  Pulse 73  Temp(Src) 97.8 F (36.6 C) (Oral)  Resp 18  Ht 5\' 7"  (1.702 m)  Wt 142.883 kg  BMI 49.32 kg/m2  SpO2 98%  LMP 03/20/2015 Physical Exam  Constitutional: She appears well-developed and well-nourished.  HENT:  Head: Normocephalic.  Right Ear: External ear normal.  Left Ear: External ear normal.  Mouth/Throat: Oropharynx is clear and moist.  Eyes: Pupils are equal, round, and reactive to light.  Cardiovascular: Normal rate.   Pulmonary/Chest: Effort normal.  Musculoskeletal: Normal range of motion.  Lymphadenopathy:    She has no cervical adenopathy.  Neurological: She is alert.  Skin: Skin is warm.  Nursing note and vitals reviewed.   ED Course  Procedures (including critical care time) Labs Review Labs Reviewed  RAPID STREP SCREEN (NOT AT [facility])  CULTURE, GROUP A STREP    Imaging Review  No results found. I have personally reviewed and evaluated these images and lab results as part of my medical decision-making.   EKG Interpretation None     [place]han vital signs are within normal parameters.  She has no outward sign of strep throat.  Her ears are clear of any exudate or erythema.  As a precaution, I will obtain a strep test can give the patient in ibuprofen for her symptoms Strep test negative.  Patient will be discharged home with instructions to take alternating type doses of Tylenol and ibuprofen for discomfort.  Follow up with her primary care physician as needed  MDM   Final diagnoses:   Pharyngitis  Otalgia of left ear         Earley Favor, NP 03/24/15 0005  Zadie Rhine, MD 03/24/15 1258

## 2015-03-23 NOTE — ED Notes (Signed)
Pt. reports headache , neck pain and left ear ache after she felt a " pop" while eating/chewing this evening . Denies injury /ambulatory .

## 2015-03-24 NOTE — ED Notes (Signed)
Pt left with all her belongings and ambulated out of the treatment area.  

## 2015-03-24 NOTE — Discharge Instructions (Signed)
Pharyngitis Pharyngitis is redness, pain, and swelling (inflammation) of your pharynx.  CAUSES  Pharyngitis is usually caused by infection. Most of the time, these infections are from viruses (viral) and are part of a cold. However, sometimes pharyngitis is caused by bacteria (bacterial). Pharyngitis can also be caused by allergies. Viral pharyngitis may be spread from person to person by coughing, sneezing, and personal items or utensils (cups, forks, spoons, toothbrushes). Bacterial pharyngitis may be spread from person to person by more intimate contact, such as kissing.  SIGNS AND SYMPTOMS  Symptoms of pharyngitis include:   Sore throat.   Tiredness (fatigue).   Low-grade fever.   Headache.  Joint pain and muscle aches.  Skin rashes.  Swollen lymph nodes.  Plaque-like film on throat or tonsils (often seen with bacterial pharyngitis). DIAGNOSIS  Your health care provider will ask you questions about your illness and your symptoms. Your medical history, along with a physical exam, is often all that is needed to diagnose pharyngitis. Sometimes, a rapid strep test is done. Other lab tests may also be done, depending on the suspected cause.  TREATMENT  Viral pharyngitis will usually get better in 3-4 days without the use of medicine. Bacterial pharyngitis is treated with medicines that kill germs (antibiotics).  HOME CARE INSTRUCTIONS   Drink enough water and fluids to keep your urine clear or pale yellow.   Only take over-the-counter or prescription medicines as directed by your health care provider:   If you are prescribed antibiotics, make sure you finish them even if you start to feel better.   Do not take aspirin.   Get lots of rest.   Gargle with 8 oz of salt water ( tsp of salt per 1 qt of water) as often as every 1-2 hours to soothe your throat.   Throat lozenges (if you are not at risk for choking) or sprays may be used to soothe your throat. SEEK MEDICAL  CARE IF:   You have large, tender lumps in your neck.  You have a rash.  You cough up green, yellow-brown, or bloody spit. SEEK IMMEDIATE MEDICAL CARE IF:   Your neck becomes stiff.  You drool or are unable to swallow liquids.  You vomit or are unable to keep medicines or liquids down.  You have severe pain that does not go away with the use of recommended medicines.  You have trouble breathing (not caused by a stuffy nose). MAKE SURE YOU:   Understand these instructions.  Will watch your condition.  Will get help right away if you are not doing well or get worse.   This information is not intended to replace advice given to you by your health care provider. Make sure you discuss any questions you have with your health care provider.   Document Released: 03/04/2005 Document Revised: 12/23/2012 Document Reviewed: 11/09/2012 Elsevier Interactive Patient Education 2016 [employer] Your strep test is negative, he can safely take Tylenol or ibuprofen for discomfort.  Follow up with your primary care physician as needed

## 2015-03-26 LAB — CULTURE, GROUP A STREP: Strep A Culture: NEGATIVE

## 2015-11-23 ENCOUNTER — Encounter (HOSPITAL_COMMUNITY): Payer: Self-pay

## 2015-11-23 ENCOUNTER — Inpatient Hospital Stay (HOSPITAL_COMMUNITY)
Admission: AD | Admit: 2015-11-23 | Discharge: 2015-11-23 | Disposition: A | Discharge status: Home / Self Care | Payer: Self-pay | Source: Ambulatory Visit | Attending: Obstetrics and Gynecology | Admitting: Obstetrics and Gynecology

## 2015-11-23 DIAGNOSIS — F329 Major depressive disorder, single episode, unspecified: Secondary | ICD-10-CM | POA: Insufficient documentation

## 2015-11-23 DIAGNOSIS — E669 Obesity, unspecified: Secondary | ICD-10-CM | POA: Insufficient documentation

## 2015-11-23 DIAGNOSIS — E282 Polycystic ovarian syndrome: Secondary | ICD-10-CM | POA: Insufficient documentation

## 2015-11-23 DIAGNOSIS — F17210 Nicotine dependence, cigarettes, uncomplicated: Secondary | ICD-10-CM | POA: Insufficient documentation

## 2015-11-23 DIAGNOSIS — F419 Anxiety disorder, unspecified: Secondary | ICD-10-CM | POA: Insufficient documentation

## 2015-11-23 LAB — POCT PREGNANCY, URINE: PREG TEST UR: NEGATIVE

## 2015-11-23 LAB — URINALYSIS, ROUTINE W REFLEX MICROSCOPIC
BILIRUBIN URINE: NEGATIVE
GLUCOSE, UA: NEGATIVE mg/dL
HGB URINE DIPSTICK: NEGATIVE
Ketones, ur: NEGATIVE mg/dL
Leukocytes, UA: NEGATIVE
Nitrite: NEGATIVE
PH: 6.5 (ref 5.0–8.0)
Protein, ur: NEGATIVE mg/dL
SPECIFIC GRAVITY, URINE: 1.025 (ref 1.005–1.030)

## 2015-11-23 NOTE — MAU Provider Note (Signed)
  History     CSN: [id number]  Arrival date and time: 11/23/15 1757   None     Chief Complaint  Patient presents with  . Ovarian Cyst  . Nausea  . Headache   HPI; Ms Kimberly Contreras presents to the MAU desiring work up for PCOS. States [facility] referred her here several months ago.  She has note wt gain, irregular cycles and facial hair growth over the last several months. She is sexual active without contraception.    Past Medical History:  Diagnosis Date  . Abscess   . Anxiety Dx 2013  . Depression Dx 2013  . Obesity     Past Surgical History:  Procedure Laterality Date  . APPENDECTOMY  2013     Family History  Problem Relation Age of Onset  . Cancer Other     [facility], great aunt   . Hypertension Other   . Diabetes Other     Social History  Substance Use Topics  . Smoking status: Current Every Day Smoker    Packs/day: 0.50    Years: 11.00    Types: Cigarettes  . Smokeless tobacco: Never Used  . Alcohol use No    Allergies: No Known Allergies  No prescriptions prior to admission.    Review of Systems  Constitutional: Negative.   Respiratory: Negative.   Cardiovascular: Negative.   Gastrointestinal: Negative.   Genitourinary: Negative.    Physical Exam   Blood pressure 95/57, pulse 86, temperature 98.5 F (36.9 C), temperature source Oral, resp. rate 18, height 5\' 7"  (1.702 m), weight (!) 144.9 kg (319 lb 6.4 oz), last menstrual period 10/18/2015.  Physical Exam  Constitutional: She appears well-developed and well-nourished.  Cardiovascular: Normal rate and regular rhythm.   Respiratory: Effort normal and breath sounds normal.  GI: Soft. Bowel sounds are normal.  obese    MAU Course  Procedures   Assessment and Plan  PCOS.  Explained to pt that she would be better served by having an evaluation and [facility]. She verbalized understanding and [facility] and or [facility].   Hermina StaggersMichael L Phillippa Straub 11/23/2015, 7:18 PM

## 2015-11-23 NOTE — MAU Note (Signed)
Pt reprots [facility]. Since then she has been having "symtoms of PCOS'. She has be nauseated , having headaches, gaining weight and having facial hair growth. Went to the Health dept  3 months ago [facility]

## 2015-12-07 IMAGING — CR DG FOOT COMPLETE 3+V*L*
3 series · 3 of 3 positions shown · non-contrast
Comparison: None.

CLINICAL DATA: Foot pain/swelling

EXAM:
LEFT FOOT - COMPLETE 3+ VIEW

[x foot ap left]
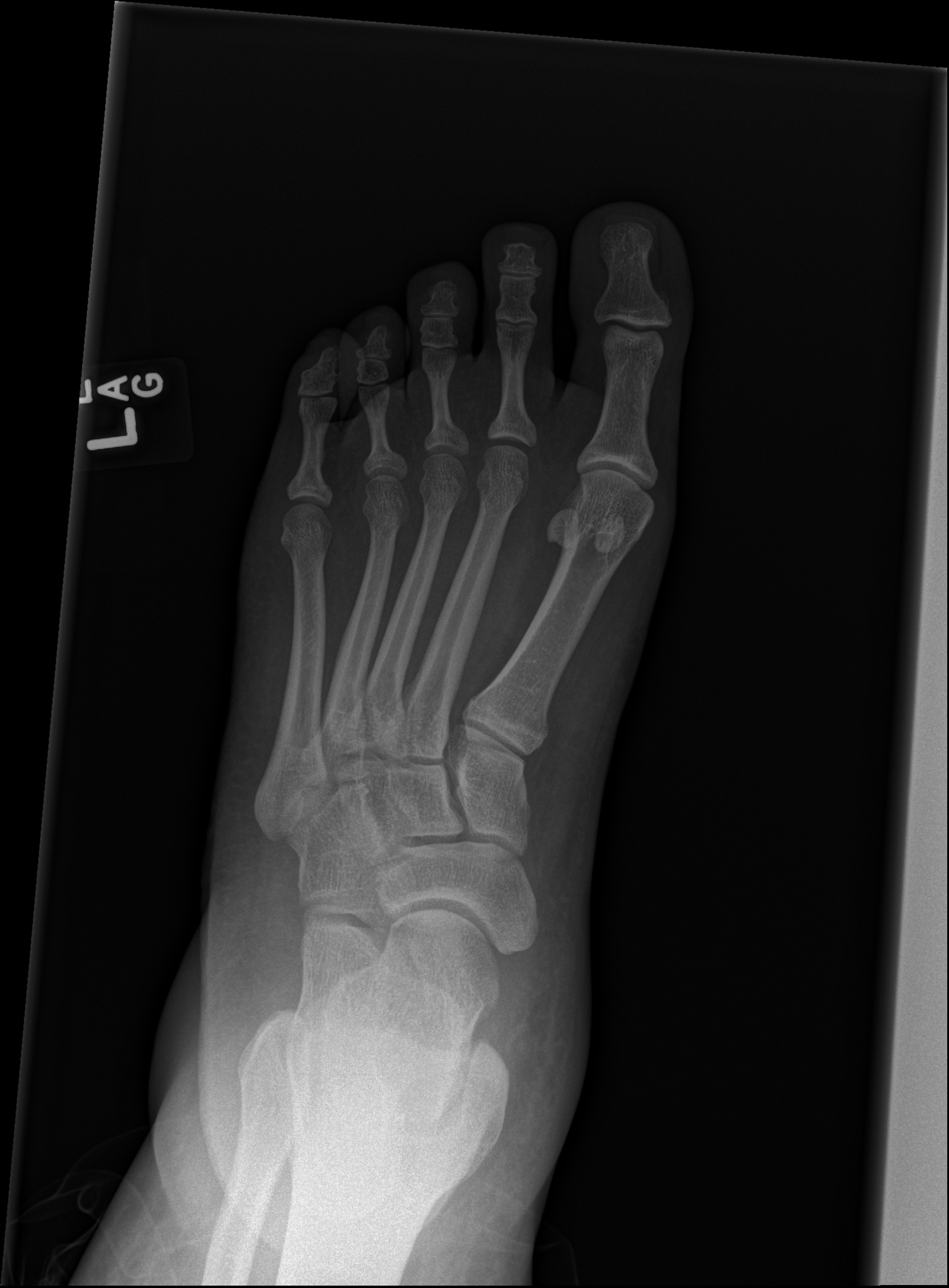

[x foot obl left]
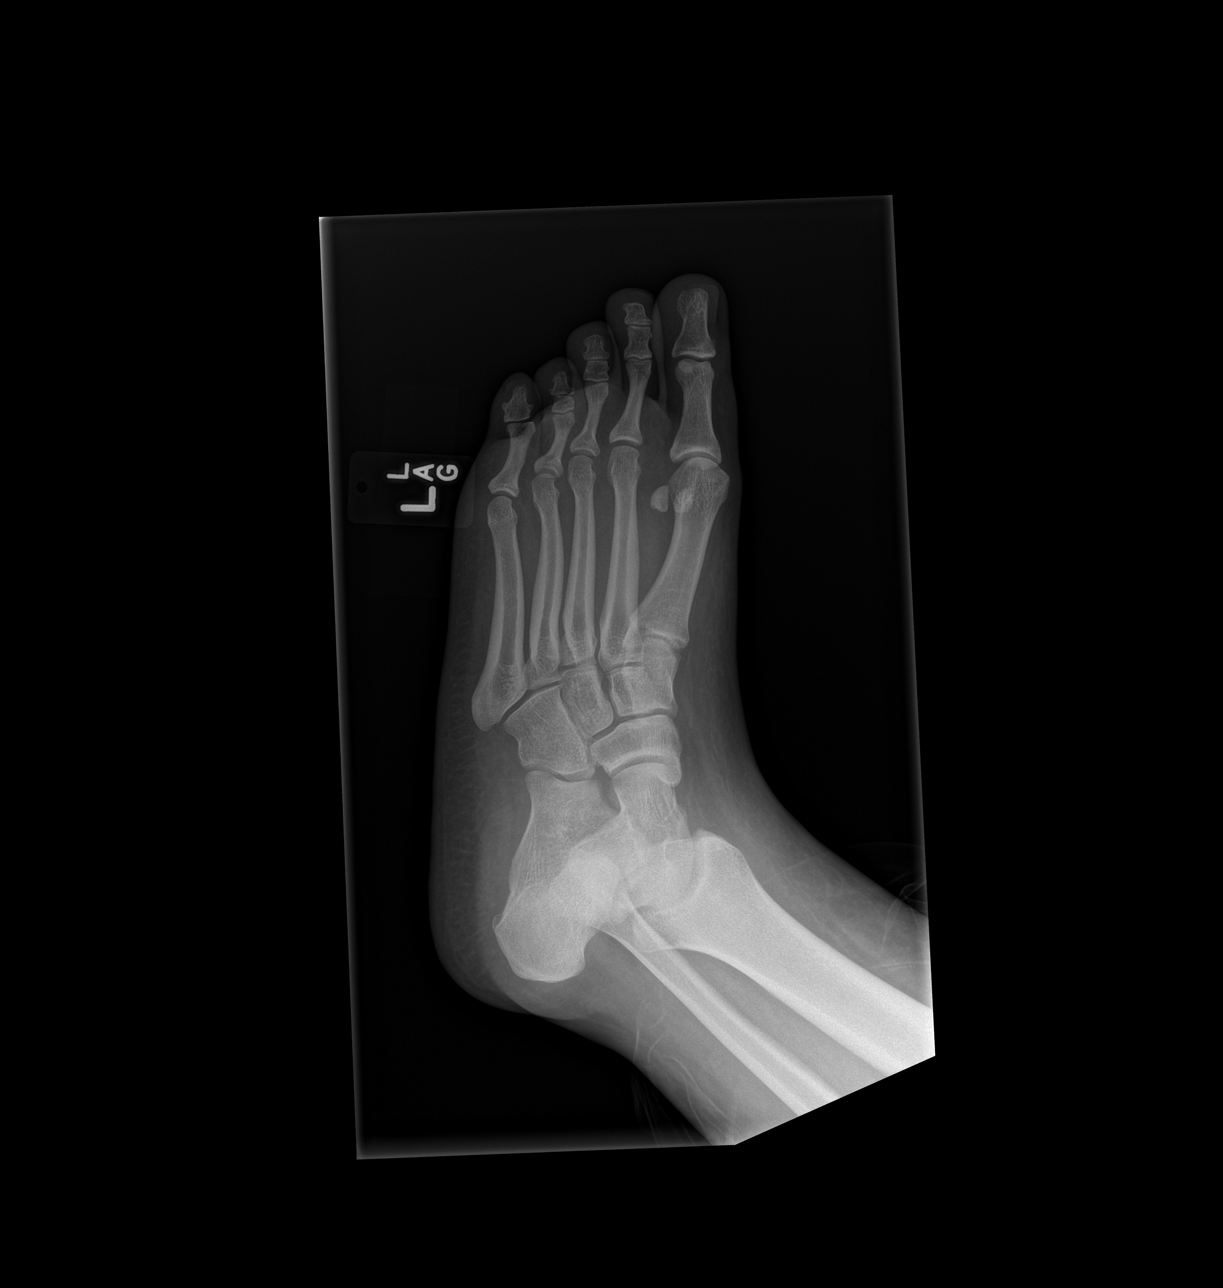

[x foot lat left]
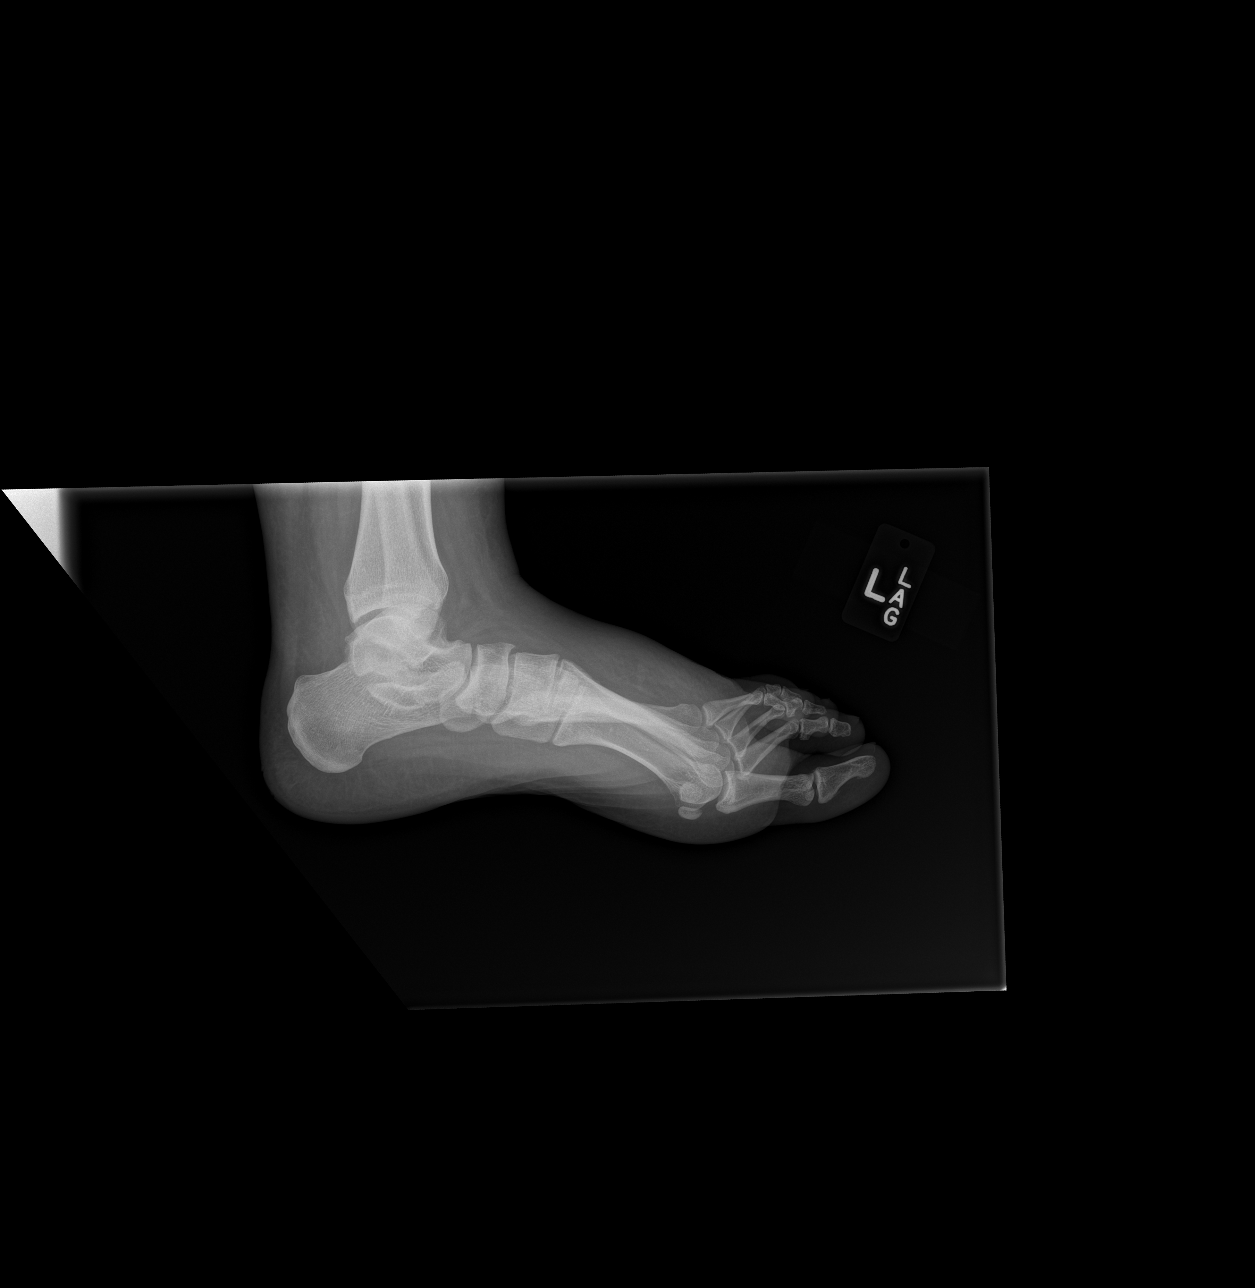

[3 of 3 positions shown; findings below may reference images not displayed]

FINDINGS: No fracture or dislocation is seen.

The joint spaces are preserved.

Moderate soft tissue swelling of the forefoot.
IMPRESSION: Moderate soft tissue swelling of the forefoot.

No acute osseous abnormality is seen.
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
## 2016-11-01 ENCOUNTER — Emergency Department (HOSPITAL_COMMUNITY): Payer: Self-pay

## 2016-11-01 ENCOUNTER — Emergency Department (HOSPITAL_COMMUNITY)
Admission: EM | Admit: 2016-11-01 | Discharge: 2016-11-01 | Disposition: A | Discharge status: Home / Self Care | Payer: Self-pay | Attending: Emergency Medicine | Admitting: Emergency Medicine

## 2016-11-01 ENCOUNTER — Encounter (HOSPITAL_COMMUNITY): Payer: Self-pay | Admitting: *Deleted

## 2016-11-01 DIAGNOSIS — F17210 Nicotine dependence, cigarettes, uncomplicated: Secondary | ICD-10-CM | POA: Insufficient documentation

## 2016-11-01 DIAGNOSIS — R0789 Other chest pain: Secondary | ICD-10-CM | POA: Insufficient documentation

## 2016-11-01 LAB — CBC WITH DIFFERENTIAL/PLATELET
Basophils Absolute: 0 K/uL (ref 0.0–0.1)
Basophils Relative: 0 %
Eosinophils Absolute: 0.3 K/uL (ref 0.0–0.7)
Eosinophils Relative: 3 %
HCT: 40.7 % (ref 36.0–46.0)
Hemoglobin: 13.5 g/dL (ref 12.0–15.0)
Lymphocytes Relative: 40 %
Lymphs Abs: 3 K/uL (ref 0.7–4.0)
MCH: 27.7 pg (ref 26.0–34.0)
MCHC: 33.2 g/dL (ref 30.0–36.0)
MCV: 83.4 fL (ref 78.0–100.0)
Monocytes Absolute: 0.5 K/uL (ref 0.1–1.0)
Monocytes Relative: 6 %
Neutro Abs: 3.8 K/uL (ref 1.7–7.7)
Neutrophils Relative %: 51 %
Platelets: 308 K/uL (ref 150–400)
RBC: 4.88 MIL/uL (ref 3.87–5.11)
RDW: 14.3 % (ref 11.5–15.5)
WBC: 7.5 K/uL (ref 4.0–10.5)

## 2016-11-01 LAB — BASIC METABOLIC PANEL
ANION GAP: 8 (ref 5–15)
BUN: 7 mg/dL (ref 6–20)
CO2: 25 mmol/L (ref 22–32)
Calcium: 8.7 mg/dL — ABNORMAL LOW (ref 8.9–10.3)
Chloride: 105 mmol/L (ref 101–111)
Creatinine, Ser: 0.81 mg/dL (ref 0.44–1.00)
GFR calc Af Amer: >60 mL/min (ref >60)
GLUCOSE: 85 mg/dL (ref 65–99)
POTASSIUM: 4 mmol/L (ref 3.5–5.1)
Sodium: 138 mmol/L (ref 135–145)

## 2016-11-01 LAB — I-STAT TROPONIN, ED: Troponin i, poc: 0 ng/mL (ref 0.00–0.08)

## 2016-11-01 MED ORDER — IBUPROFEN 800 MG PO TABS: 800.0000 mg | ORAL_TABLET | Freq: Three times a day (TID) | ORAL | 0 refills | Status: DC

## 2016-11-01 NOTE — ED Notes (Signed)
Writer states they had 2 failed attempts at sticking patient for blood draw.
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
## 2016-11-01 NOTE — ED Notes (Signed)
Pt taken to CXR

## 2016-11-01 NOTE — ED Triage Notes (Signed)
Patient is alert and oriented x4.  She is being seen for generalized chest pain that she states feels like something is choking her.  Currently she rates her pain 9 of 10.  Patient denies any Hx of this issue.

## 2016-11-01 NOTE — Discharge Instructions (Signed)

## 2016-11-21 ENCOUNTER — Encounter (HOSPITAL_COMMUNITY): Payer: Self-pay | Admitting: Emergency Medicine

## 2016-11-21 DIAGNOSIS — G44049 Chronic paroxysmal hemicrania, not intractable: Secondary | ICD-10-CM | POA: Insufficient documentation

## 2016-11-21 DIAGNOSIS — F17210 Nicotine dependence, cigarettes, uncomplicated: Secondary | ICD-10-CM | POA: Insufficient documentation
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
## 2016-11-22 ENCOUNTER — Emergency Department (HOSPITAL_COMMUNITY)
Admission: EM | Admit: 2016-11-22 | Discharge: 2016-11-22 | Disposition: A | Discharge status: Home / Self Care | Payer: Self-pay | Attending: Emergency Medicine | Admitting: Emergency Medicine

## 2016-11-22 DIAGNOSIS — G44049 Chronic paroxysmal hemicrania, not intractable: Secondary | ICD-10-CM

## 2016-11-22 LAB — PREGNANCY, URINE: Preg Test, Ur: NEGATIVE

## 2016-11-22 MED ORDER — IBUPROFEN 600 MG PO TABS: 600.0000 mg | ORAL_TABLET | Freq: Four times a day (QID) | ORAL | 0 refills | Status: DC | PRN

## 2016-11-22 MED ORDER — ACETAMINOPHEN 325 MG PO TABS
650.0000 mg | ORAL_TABLET | Freq: Once | ORAL | Status: AC
  Administered 2016-11-22: 650 mg via ORAL

## 2016-11-22 MED ORDER — ACETAMINOPHEN 325 MG PO TABS
ORAL_TABLET | ORAL | Status: AC
  Filled 2016-11-22: qty 2

## 2016-11-22 MED ORDER — NAPROXEN 500 MG PO TABS: 500.0000 mg | ORAL_TABLET | Freq: Once | ORAL | Status: DC
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
## 2016-11-22 NOTE — Discharge Instructions (Signed)
We saw you in the ER for headaches. All the labs and imaging are normal. We are not sure what is causing your headaches, however, there appears to be no evidence of infection, bleeds or tumors based on our exam and results.  Please take motrin round the clock for the next 6 hours, and take other meds prescribed only for break through pain.  Please return to the ER if the headache gets severe and in not improving, you have associated new one sided numbness, tingling, weakness or confusion, seizures, poor balance or poor vision.

## 2017-04-28 ENCOUNTER — Other Ambulatory Visit: Payer: Self-pay

## 2017-04-28 ENCOUNTER — Emergency Department (HOSPITAL_COMMUNITY): Payer: Self-pay

## 2017-04-28 ENCOUNTER — Encounter (HOSPITAL_COMMUNITY): Payer: Self-pay | Admitting: Emergency Medicine

## 2017-04-28 ENCOUNTER — Emergency Department (HOSPITAL_COMMUNITY)
Admission: EM | Admit: 2017-04-28 | Discharge: 2017-04-28 | Disposition: A | Discharge status: Home / Self Care | Payer: Self-pay | Attending: Emergency Medicine | Admitting: Emergency Medicine

## 2017-04-28 DIAGNOSIS — B349 Viral infection, unspecified: Secondary | ICD-10-CM | POA: Insufficient documentation

## 2017-04-28 DIAGNOSIS — F17210 Nicotine dependence, cigarettes, uncomplicated: Secondary | ICD-10-CM | POA: Insufficient documentation

## 2017-04-28 LAB — INFLUENZA PANEL BY PCR (TYPE A & B)
INFLBPCR: NEGATIVE
Influenza A By PCR: NEGATIVE

## 2017-04-28 MED ORDER — IBUPROFEN 800 MG PO TABS: 800.0000 mg | ORAL_TABLET | Freq: Three times a day (TID) | ORAL | 0 refills | Status: DC

## 2017-04-28 MED ORDER — BENZONATATE 100 MG PO CAPS: 100.0000 mg | ORAL_CAPSULE | Freq: Three times a day (TID) | ORAL | 0 refills | Status: DC | PRN

## 2017-04-28 MED ORDER — ALBUTEROL SULFATE HFA 108 (90 BASE) MCG/ACT IN AERS: 1.0000 | INHALATION_SPRAY | Freq: Four times a day (QID) | RESPIRATORY_TRACT | 0 refills | Status: DC | PRN

## 2017-04-28 MED ORDER — FLUTICASONE PROPIONATE 50 MCG/ACT NA SUSP: 1.0000 | Freq: Every day | NASAL | 2 refills | Status: DC

## 2017-04-28 NOTE — ED Provider Notes (Signed)
[facility] Provider Note   CSN: [id number] Arrival date & time: 04/28/17  1709     History   Chief Complaint Chief Complaint  Patient presents with  . Flu-like symptoms    HPI Kimberly Contreras is a 32 y.o. female with a hx of tobacco abuse, anxiety, and depression who presents to the ED with complaint of URI sxs x 2 weeks. Patient states she is having congestion, rhinorrhea, L ear pain, sore throat, and dry cough.  States pain is a 5/10 in severity, states sxs are worse in the AM when she wakes up, no specific alleviating/aggravating factors. Patient states she has baseline DOE as well as some wheezing which she typically uses an inhaler for- has not had the inhaler and feels this is worse. States friend with similar sxs. Denies fever, chills, body ache, or chest pain. Denies leg pain/swelling, hemoptysis, recent surgery/trauma, recent long travel, personal hx of cancer, or hx of DVT/PE. States she is on an OCP.   HPI  Past Medical History:  Diagnosis Date  . Abscess   . Anxiety Dx 2013  . Depression Dx 2013  . Obesity     Patient Active Problem List   Diagnosis Date Noted  . Severe obesity (BMI >= 40) (HCC) 02/23/2014  . Homeless single person 02/23/2014  . Acne 02/23/2014  . Current smoker 02/23/2014  . MDD (major depressive disorder), recurrent severe, with psychosis 11/25/2012  . Generalized anxiety disorder 11/25/2012  . Pilonidal cyst without abscess 08/19/2011    Past Surgical History:  Procedure Laterality Date  . APPENDECTOMY  2013     OB History    No data available       Home Medications    Prior to Admission medications   Medication Sig Start Date End Date Taking? Authorizing Provider  ibuprofen (ADVIL,MOTRIN) 600 MG tablet Take 1 tablet (600 mg total) by mouth every 6 (six) hours as needed. 11/22/16   Derwood KaplanNanavati, Ankit, MD    Family History Family History  Problem Relation Age of Onset  . Cancer Other    [facility] great [facility]   . Hypertension Other   . Diabetes Other     Social History Social History   Tobacco Use  . Smoking status: Current Every Day Smoker    Packs/day: 0.50    Years: 11.00    Pack years: 5.50    Types: Cigarettes  . Smokeless tobacco: Never Used  Substance Use Topics  . Alcohol use: No    Alcohol/week: 1.2 oz    Types: 2 Shots of liquor per week  . Drug use: No     Allergies   Patient has no known allergies.   Review of Systems Review of Systems  Constitutional: Negative for chills and fever.  HENT: Positive for congestion, ear pain (L), rhinorrhea and sore throat.   Respiratory: Positive for cough, shortness of breath (with walking around- states this is somewhat worse than baseline) and wheezing.   Cardiovascular: Negative for chest pain, palpitations and leg swelling.  Gastrointestinal: Negative for nausea and vomiting.  Musculoskeletal: Negative for myalgias.  Neurological: Negative for weakness and numbness.  All other systems reviewed and are negative.   Physical Exam Updated Vital Signs BP 131/74 (BP Location: Left Arm)   Pulse 83   Temp 98.2 F (36.8 C) (Oral)   Resp 16   Ht 5\' 7"  (1.702 m)   Wt 127 kg (280 lb)   LMP 04/02/2017   SpO2  99%   BMI 43.85 kg/m   Physical Exam  Constitutional: She appears well-developed and well-nourished. No distress.  HENT:  Head: Normocephalic and atraumatic.  Right Ear: Tympanic membrane is not perforated, not erythematous, not retracted and not bulging.  Left Ear: Tympanic membrane is not perforated, not erythematous, not retracted and not bulging.  Nose: Mucosal edema present. Right sinus exhibits no maxillary sinus tenderness and no frontal sinus tenderness. Left sinus exhibits no maxillary sinus tenderness and no frontal sinus tenderness.  Mouth/Throat: Uvula is midline and oropharynx is clear and moist. No oropharyngeal exudate or posterior oropharyngeal erythema.  Patient is tolerating  her own secretions without difficulty.  There is no trismus, there is no drooling, there is no hot potato voice.  Eyes: Conjunctivae are normal. Pupils are equal, round, and reactive to light. Right eye exhibits no discharge. Left eye exhibits no discharge.  Neck: Normal range of motion. Neck supple.  Cardiovascular: Normal rate and regular rhythm.  No murmur heard. Pulmonary/Chest: Effort normal and breath sounds normal. No respiratory distress. She has no wheezes. She has no rhonchi. She has no rales.  Abdominal: Soft. She exhibits no distension. There is no tenderness.  Musculoskeletal: She exhibits no edema.  Lymphadenopathy:    She has no cervical adenopathy.  Neurological: She is alert.  Skin: Skin is warm and dry. No rash noted.  Psychiatric: She has a normal mood and affect. Her behavior is normal.  Nursing note and vitals reviewed.   ED Treatments / Results  Labs Results for orders placed or performed during the hospital encounter of 04/28/17  Influenza panel by PCR (type A & B)  Result Value Ref Range   Influenza A By PCR NEGATIVE NEGATIVE   Influenza B By PCR NEGATIVE NEGATIVE   EKG  EKG Interpretation None      Radiology Dg Chest 2 View  Result Date: 04/28/2017 CLINICAL DATA:  Cough and shortness of breath. EXAM: CHEST  2 VIEW COMPARISON:  11/01/2016 FINDINGS: The heart size and mediastinal contours are within normal limits. Both lungs are clear. Stable mild thoracic scoliosis. IMPRESSION: Stable exam.  No active cardiopulmonary disease. Electronically Signed   By: Myles Rosenthal M.D.   On: 04/28/2017 20:36    Procedures Procedures (including critical care time)  Medications Ordered in ED Medications - No data to display   Initial Impression / Assessment and Plan / ED Course  I have reviewed the triage vital signs and the nursing notes.  Pertinent labs & imaging results that were available during my care of the patient were reviewed by me and considered in my  medical decision making (see chart for details).    Patient presents with symptoms consistent with viral illness. She is nontoxic appearing, in no apparent distress, vitals are WNL. Patient is afebrile and without adventitious sounds on lung exam, CXR negative for infiltrate, doubt PNA. No wheezing on exam on my exam. Afebrile, no sinus tenderness, doubt sinusitis. Centor score 0, doubt strep pharyngitis. Influenza negative. Given reports of some increased baseline DOE patient evaluated with ambulatory pulse ox and SpO2 remained 99-100%. Suspect viral etiology, will treat supportively with Ibuprofen, Flonase, and Tessalon. Will additionally provide albuterol inhaler as this has helped with patient's DOE previously. I discussed results, treatment plan, need for PCP follow-up, and return precautions with the patient. Provided opportunity for questions, patient confirmed understanding and is in agreement with plan.   Vitals:   04/28/17 1713 04/28/17 1716 04/28/17 2229  BP: 131/74  128/66  Pulse: 83  88  Resp: 16  19  Temp: 98.2 F (36.8 C)    TempSrc: Oral    SpO2: 99%  99%  Weight:  127 kg (280 lb)   Height:  5\' 7"  (1.702 m)      Final Clinical Impressions(s) / ED Diagnoses   Final diagnoses:  Viral illness    ED Discharge Orders        Ordered    ibuprofen (ADVIL,MOTRIN) 800 MG tablet  3 times daily     04/28/17 2212    benzonatate (TESSALON) 100 MG capsule  3 times daily PRN     04/28/17 2212    fluticasone (FLONASE) 50 MCG/ACT nasal spray  Daily     04/28/17 2212    albuterol (PROVENTIL HFA;VENTOLIN HFA) 108 (90 Base) MCG/ACT inhaler  Every 6 hours PRN     04/28/17 2212       Cherly Anderson, PA-C 04/29/17 0153    Margarita Grizzle, MD 04/29/17 1538

## 2017-04-28 NOTE — ED Triage Notes (Signed)
Pt presents with cough, congestion, ear ache, chills; pt states she has been around others with similar complaints; pt also reports increased SOB with exertion

## 2017-04-28 NOTE — ED Notes (Signed)
Pt ambulated to nurses station and back without difficulty.  Pt O2 saturations remained 99 to 100%

## 2017-04-28 NOTE — Discharge Instructions (Signed)
You were seen in the emergency today and diagnosed with a viral illness.  Your flu swab was negative, your X-ray did not show a pneumonia. I have prescribed you multiple medications to treat your symptoms.   -Flonase to be used 1 spray in each nostril daily.  This medication is used to treat your congestion.  -Tessalon can be taken once every 8 hours as needed.  This medication is used to treat your cough.  -Ibuprofen to be taken once every 8 hours as needed for pain.  - Inhaler to use once every 6 hours as needed for shortness of breath or wheezing  You will need to follow-up with your primary care provider in 1 week if your symptoms have not improved.  If you do not have a primary care provider one is provided in your discharge instructions.  Return to the emergency department for any new or worsening symptoms including but not limited to persistent fever for 5 days, difficulty breathing, chest pain, or passing out.

## 2017-04-29 LAB — RESPIRATORY PANEL BY PCR
Adenovirus: NOT DETECTED
Bordetella pertussis: NOT DETECTED
CORONAVIRUS HKU1-RVPPCR: NOT DETECTED
CORONAVIRUS NL63-RVPPCR: NOT DETECTED
CORONAVIRUS OC43-RVPPCR: NOT DETECTED
Chlamydophila pneumoniae: NOT DETECTED
Coronavirus 229E: NOT DETECTED
INFLUENZA A H1 2009-RVPPR: NOT DETECTED
INFLUENZA A H3-RVPPCR: NOT DETECTED
INFLUENZA B-RVPPCR: NOT DETECTED
Influenza A H1: NOT DETECTED
Influenza A: NOT DETECTED
METAPNEUMOVIRUS-RVPPCR: NOT DETECTED
MYCOPLASMA PNEUMONIAE-RVPPCR: NOT DETECTED
PARAINFLUENZA VIRUS 1-RVPPCR: NOT DETECTED
PARAINFLUENZA VIRUS 2-RVPPCR: NOT DETECTED
PARAINFLUENZA VIRUS 3-RVPPCR: NOT DETECTED
Parainfluenza Virus 4: NOT DETECTED
RHINOVIRUS / ENTEROVIRUS - RVPPCR: NOT DETECTED
Respiratory Syncytial Virus: NOT DETECTED

## 2017-06-19 ENCOUNTER — Emergency Department (HOSPITAL_COMMUNITY)
Admission: EM | Admit: 2017-06-19 | Discharge: 2017-06-19 | Disposition: A | Discharge status: Home / Self Care | Payer: No Typology Code available for payment source | Attending: Emergency Medicine | Admitting: Emergency Medicine

## 2017-06-19 ENCOUNTER — Encounter (HOSPITAL_COMMUNITY): Payer: Self-pay | Admitting: Nurse Practitioner

## 2017-06-19 ENCOUNTER — Other Ambulatory Visit: Payer: Self-pay

## 2017-06-19 ENCOUNTER — Emergency Department (HOSPITAL_COMMUNITY): Payer: No Typology Code available for payment source

## 2017-06-19 DIAGNOSIS — F17210 Nicotine dependence, cigarettes, uncomplicated: Secondary | ICD-10-CM | POA: Diagnosis not present

## 2017-06-19 DIAGNOSIS — Y999 Unspecified external cause status: Secondary | ICD-10-CM | POA: Diagnosis not present

## 2017-06-19 DIAGNOSIS — Y92481 Parking lot as the place of occurrence of the external cause: Secondary | ICD-10-CM | POA: Insufficient documentation

## 2017-06-19 DIAGNOSIS — Y939 Activity, unspecified: Secondary | ICD-10-CM | POA: Diagnosis not present

## 2017-06-19 DIAGNOSIS — M25561 Pain in right knee: Secondary | ICD-10-CM | POA: Insufficient documentation

## 2017-06-19 DIAGNOSIS — S8991XA Unspecified injury of right lower leg, initial encounter: Secondary | ICD-10-CM | POA: Diagnosis present

## 2017-06-19 MED ORDER — CYCLOBENZAPRINE HCL 10 MG PO TABS: 10.0000 mg | ORAL_TABLET | Freq: Two times a day (BID) | ORAL | 0 refills | Status: DC | PRN

## 2017-06-19 MED ORDER — NAPROXEN 500 MG PO TABS: 500.0000 mg | ORAL_TABLET | Freq: Two times a day (BID) | ORAL | 0 refills | Status: DC

## 2017-06-19 MED ORDER — CYCLOBENZAPRINE HCL 10 MG PO TABS
10.0000 mg | ORAL_TABLET | Freq: Once | ORAL | Status: AC
  Administered 2017-06-19: 10 mg via ORAL
  Filled 2017-06-19: qty 1
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
## 2017-06-19 NOTE — ED Notes (Addendum)
PA student at bedside.
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
## 2017-06-19 NOTE — ED Notes (Signed)
Pt declined discharge vitals

## 2017-06-19 NOTE — Discharge Instructions (Addendum)
Do not take the muscle relaxer if driving as it will make you sleepy. Follow up with Lavinia SharpsMary Ann Placey, NP or return here as needed.
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
## 2017-09-14 NOTE — Congregational Nurse Program (Signed)
Congregational Nurse Program Note  Date of Encounter: 09/10/2017  Past Medical History: Past Medical History:  Diagnosis Date  . Abscess   . Anxiety Dx 2013  . Depression Dx 2013  . Obesity     Encounter Details: CNP Questionnaire - 09/10/17 1543      Questionnaire   Patient Status  Not Applicable    Race  Black or African American    Location Patient Served At  Not Applicable    Insurance  Not Applicable    Uninsured  Uninsured (NEW 1x/quarter)    Food  Yes, have food insecurities;Within past 12 months, worried food would run out with no money to buy more;Within past 12 months, food ran out with no money to buy more    Housing/Utilities  No permanent housing;Yes, have permanent housing    Transportation  Yes, need transportation assistance    Interpersonal Safety  No, do not feel physically and emotionally safe where you currently live    Medication  Yes, have medication insecurities    Medical Provider  Yes    Referrals  Behavioral/Mental Health Provider    ED Visit Averted  Not Applicable    Life-Saving Intervention Made  Not Applicable       Requested assistance with medications.  States wants to go back on her Wellbutrin.  Referred her back to her PHCP, [facility]avinia [facility] Ann [facility]

## 2017-10-04 ENCOUNTER — Encounter (HOSPITAL_COMMUNITY): Payer: Self-pay | Admitting: Emergency Medicine

## 2017-10-04 ENCOUNTER — Other Ambulatory Visit: Payer: Self-pay

## 2017-10-04 ENCOUNTER — Emergency Department (HOSPITAL_COMMUNITY)
Admission: EM | Admit: 2017-10-04 | Discharge: 2017-10-04 | Disposition: A | Discharge status: Home / Self Care | Payer: Self-pay | Attending: Emergency Medicine | Admitting: Emergency Medicine

## 2017-10-04 DIAGNOSIS — Z79899 Other long term (current) drug therapy: Secondary | ICD-10-CM | POA: Insufficient documentation

## 2017-10-04 DIAGNOSIS — R300 Dysuria: Secondary | ICD-10-CM

## 2017-10-04 DIAGNOSIS — F17210 Nicotine dependence, cigarettes, uncomplicated: Secondary | ICD-10-CM | POA: Insufficient documentation

## 2017-10-04 LAB — URINALYSIS, ROUTINE W REFLEX MICROSCOPIC
BILIRUBIN URINE: NEGATIVE
Glucose, UA: NEGATIVE mg/dL
Hgb urine dipstick: NEGATIVE
Ketones, ur: NEGATIVE mg/dL
Leukocytes, UA: NEGATIVE
NITRITE: NEGATIVE
PROTEIN: NEGATIVE mg/dL
Specific Gravity, Urine: 1.028 (ref 1.005–1.030)
pH: 5 (ref 5.0–8.0)

## 2017-10-04 LAB — I-STAT CHEM 8, ED
CALCIUM ION: 1.16 mmol/L (ref 1.15–1.40)
CREATININE: 0.7 mg/dL (ref 0.44–1.00)
Chloride: 102 mmol/L (ref 98–111)
GLUCOSE: 82 mg/dL (ref 70–99)
HCT: 42 % (ref 36.0–46.0)
Hemoglobin: 14.3 g/dL (ref 12.0–15.0)
Potassium: 3.8 mmol/L (ref 3.5–5.1)
Sodium: 139 mmol/L (ref 135–145)
TCO2: 26 mmol/L (ref 22–32)

## 2017-10-04 LAB — WET PREP, GENITAL
Sperm: NONE SEEN
TRICH WET PREP: NONE SEEN
YEAST WET PREP: NONE SEEN

## 2017-10-04 LAB — POC URINE PREG, ED: PREG TEST UR: NEGATIVE

## 2017-10-04 MED ORDER — AZITHROMYCIN 250 MG PO TABS
1000.0000 mg | ORAL_TABLET | Freq: Once | ORAL | Status: AC
  Administered 2017-10-04: 1000 mg via ORAL
  Filled 2017-10-04: qty 4

## 2017-10-04 NOTE — ED Provider Notes (Signed)
[facility]-EMERGENCY DEPT Provider Note   CSN: [id number] Arrival date & time: 10/04/17  1039     History   Chief Complaint Chief Complaint  Patient presents with  . Urinary Tract Infection  . Back Pain    HPI Kimberly Contreras is a 32 y.o. female who presents to the ED with c/o dysuria that started 2 weeks ago. She reports her urine is concentrated. Patient is homeless and outside in the heat and thinks she may also be dehydrated. Patient reports vaginal d/c today. Last sexual intercourse one month ago unprotected. Patient with hx of GC and Chlamydia.  LMP 09/18/17.   The history is provided by the patient. No language interpreter was used.  Urinary Tract Infection   This is a new problem. The current episode started more than 1 week ago. The problem occurs every urination. The problem has been gradually worsening. The quality of the pain is described as burning. There has been no fever. She is sexually active. Associated symptoms include discharge. Pertinent negatives include no chills, no nausea, no vomiting, no frequency, no hematuria, no urgency and no flank pain. She has tried nothing for the symptoms.  Back Pain   Associated symptoms include dysuria. Pertinent negatives include no headaches.  Vaginal Discharge   This is a new problem. The current episode started yesterday. The discharge occurs spontaneously. The discharge was thick. Associated symptoms include constipation and dysuria. Pertinent negatives include no diarrhea, no nausea, no vomiting and no frequency. She has tried nothing for the symptoms. Her past medical history is significant for STD and ovarian cysts.    Past Medical History:  Diagnosis Date  . Abscess   . Anxiety Dx 2013  . Depression Dx 2013  . Obesity     Patient Active Problem List   Diagnosis Date Noted  . Severe obesity (BMI >= 40) (HCC) 02/23/2014  . Homeless single person 02/23/2014  . Acne 02/23/2014  . Current smoker  02/23/2014  . MDD (major depressive disorder), recurrent severe, with psychosis 11/25/2012  . Generalized anxiety disorder 11/25/2012  . Pilonidal cyst without abscess 08/19/2011    Past Surgical History:  Procedure Laterality Date  . APPENDECTOMY  2013      OB History   None      Home Medications    Prior to Admission medications   Medication Sig Start Date End Date Taking? Authorizing Provider  albuterol (PROVENTIL HFA;VENTOLIN HFA) 108 (90 Base) MCG/ACT inhaler Inhale 1-2 puffs into the lungs every 6 (six) hours as needed for wheezing or shortness of breath. Patient not taking: Reported on 06/19/2017 04/28/17   Petrucelli, Pleas Koch, PA-C  cyclobenzaprine (FLEXERIL) 10 MG tablet Take 1 tablet (10 mg total) by mouth 2 (two) times daily as needed for muscle spasms. 06/19/17   Janne Napoleon, NP  naproxen (NAPROSYN) 500 MG tablet Take 1 tablet (500 mg total) by mouth 2 (two) times daily. 06/19/17   Janne Napoleon, NP    Family History Family History  Problem Relation Age of Onset  . Cancer Other        breast in [facility], great aunt   . Hypertension Other   . Diabetes Other     Social History Social History   Tobacco Use  . Smoking status: Current Every Day Smoker    Packs/day: 0.50    Years: 11.00    Pack years: 5.50    Types: Cigarettes  . Smokeless tobacco: Never Used  Substance Use Topics  .  Alcohol use: No    Alcohol/week: 1.2 oz    Types: 2 Shots of liquor per week    Comment: occ  . Drug use: No     Allergies   Patient has no known allergies.   Review of Systems Review of Systems  Constitutional: Negative for chills.  HENT: Negative.   Gastrointestinal: Positive for constipation. Negative for diarrhea, nausea and vomiting.  Genitourinary: Positive for dysuria and vaginal discharge. Negative for flank pain, frequency, hematuria and urgency.  Musculoskeletal: Positive for back pain.  Skin: Negative for rash.  Neurological: Negative for syncope and  headaches.  Psychiatric/Behavioral: Negative for confusion.     Physical Exam Updated Vital Signs BP 135/60 (BP Location: Right Arm)   Pulse 68   Temp 97.9 F (36.6 C) (Oral)   Resp 16   Wt (!) 147.4 kg (325 lb)   LMP 09/18/2017 (Exact Date)   SpO2 100%   BMI 50.90 kg/m   Physical Exam  Constitutional: No distress.  obese  HENT:  Head: Normocephalic.  Eyes: EOM are normal.  Neck: Neck supple.  Cardiovascular: Normal rate.  Pulmonary/Chest: Effort normal.  Abdominal: Soft. There is no tenderness. There is no CVA tenderness.  Genitourinary:  Genitourinary Comments: External genitalia without lesions, white d/c vaginal vault. No CMT.  Musculoskeletal: Normal range of motion.  Neurological: She is alert.  Skin: Skin is warm and dry.  Psychiatric: She has a normal mood and affect. Her behavior is normal.  Nursing note and vitals reviewed.    ED Treatments / Results  Labs (all labs ordered are listed, but only abnormal results are displayed) Labs Reviewed  WET PREP, GENITAL - Abnormal; Notable for the following components:      Result Value   Clue Cells Wet Prep HPF POC PRESENT (*)    WBC, Wet Prep HPF POC MANY (*)    All other components within normal limits  URINALYSIS, ROUTINE W REFLEX MICROSCOPIC - Abnormal; Notable for the following components:   APPearance HAZY (*)    All other components within normal limits  I-STAT CHEM 8, ED - Abnormal; Notable for the following components:   BUN <3 (*)    All other components within normal limits  URINE CULTURE  POC URINE PREG, ED  GC/CHLAMYDIA PROBE AMP ([place]) NOT AT [facility]   Radiology No results found.  Procedures Procedures (including critical care time)  Medications Ordered in ED Medications  azithromycin (ZITHROMAX) tablet 1,000 mg (1,000 mg Oral Given 10/04/17 1508)   32 y.o. female with dysuria given she has a normal urinalysis will treat with Zithromax 1 gram po for possible Chlamydia infection.  Discussed with the patient PO fluids to prevent dehydration since she is living on the street. Patient agrees with plan. Discussed f/u with [facility] or her PCP for additional screening as needed.   Initial Impression / Assessment and Plan / ED Course  I have reviewed the triage vital signs and the nursing notes.   Final Clinical Impressions(s) / ED Diagnoses   Final diagnoses:  Dysuria    ED Discharge Orders    None       Kerrie Buffaloeese, Jobani Sabado [place], [place] 10/04/17 2013    Bethann BerkshireZammit, Joseph, MD 10/05/17 1008

## 2017-10-04 NOTE — ED Triage Notes (Signed)
Pt burning on urination x 2 week. Started that she thinks her urine is concentrated. Urine specimen collected, noted as straw colored and clear. [facility], was referred to [facility] yesterday. Unable to do test due to incomplete paperwork.

## 2017-10-04 NOTE — Discharge Instructions (Addendum)
Your urine today was normal. We have sent cultures for gonorrhea and chlamydia. If they are positive someone will call you or you can see your results in "My Chart". Follow up with Corrie DandyMary An Placey for your health care. Return here as needed.

## 2017-10-05 LAB — URINE CULTURE

## 2017-10-06 LAB — GC/CHLAMYDIA PROBE AMP (~~LOC~~) NOT AT ARMC
CHLAMYDIA, DNA PROBE: NEGATIVE
NEISSERIA GONORRHEA: NEGATIVE

## 2018-03-26 ENCOUNTER — Ambulatory Visit: Payer: No Typology Code available for payment source | Attending: Urology | Admitting: Physical Therapy

## 2018-04-13 ENCOUNTER — Ambulatory Visit: Payer: No Typology Code available for payment source | Admitting: Physical Therapy

## 2019-01-07 ENCOUNTER — Encounter (HOSPITAL_COMMUNITY): Payer: Self-pay

## 2019-01-07 ENCOUNTER — Ambulatory Visit (HOSPITAL_COMMUNITY)
Admission: EM | Admit: 2019-01-07 | Discharge: 2019-01-07 | Disposition: A | Discharge status: Home / Self Care | Payer: Self-pay | Attending: Family Medicine | Admitting: Family Medicine

## 2019-01-07 ENCOUNTER — Other Ambulatory Visit: Payer: Self-pay

## 2019-01-07 DIAGNOSIS — H9311 Tinnitus, right ear: Secondary | ICD-10-CM

## 2019-01-07 MED ORDER — CETIRIZINE HCL 10 MG PO TABS: 10.0000 mg | ORAL_TABLET | Freq: Every day | ORAL | 0 refills | Status: DC

## 2019-01-07 MED ORDER — FLUTICASONE PROPIONATE 50 MCG/ACT NA SUSP: 1.0000 | Freq: Every day | NASAL | 2 refills | Status: DC

## 2019-01-07 NOTE — Discharge Instructions (Addendum)
I believe that the ringing in the ear is most liekly caused by allergies We will try some daily allergy medication to see if this helps If not seeing any relief in a few weeks you need to see an ear, nose and throat specialist.

## 2019-01-07 NOTE — ED Triage Notes (Signed)
Patient presents to Urgent Care with complaints of right ear ringing since 3 months ago. Patient reports it starts randomly and "stops when I eat healthy or drink water".

## 2019-01-07 NOTE — ED Provider Notes (Signed)
[facility]    CSN: [id number] Arrival date & time: 01/07/19  0388      History   Chief Complaint Chief Complaint  Patient presents with  . Tinnitus    HPI Rio Taber is a 33 y.o. female.   Patient is a 33 year old female past no history of abscess, anxiety, depression, obesity.  She presents today with approximately 3 to 4 months of right ear ringing.  This problem has been intermittent.  Some right ear itching at times.  Denies any pain in the ear, hearing loss.  No foreign bodies or injuries to the ear.  No known noise damage to the ear. No recent infections.  She does have a past medical history of allergies.  Currently not taking any allergy medicine.  She has not tried anything for symptoms.  ROS per HPI      Past Medical History:  Diagnosis Date  . Abscess   . Anxiety Dx 2013  . Depression Dx 2013  . Obesity     Patient Active Problem List   Diagnosis Date Noted  . Severe obesity (BMI >= 40) (HCC) 02/23/2014  . Homeless single person 02/23/2014  . Acne 02/23/2014  . Current smoker 02/23/2014  . MDD (major depressive disorder), recurrent severe, with psychosis 11/25/2012  . Generalized anxiety disorder 11/25/2012  . Pilonidal cyst without abscess 08/19/2011    Past Surgical History:  Procedure Laterality Date  . APPENDECTOMY  2013     OB History   No obstetric history on file.      Home Medications    Prior to Admission medications   Medication Sig Start Date End Date Taking? Authorizing Provider  cetirizine (ZYRTEC) 10 MG tablet Take 1 tablet (10 mg total) by mouth daily. 01/07/19   Miciah Covelli, Gloris Manchester A, NP  fluticasone (FLONASE) 50 MCG/ACT nasal spray Place 1 spray into both nostrils daily. 01/07/19   Dahlia Byes A, NP  albuterol (PROVENTIL HFA;VENTOLIN HFA) 108 (90 Base) MCG/ACT inhaler Inhale 1-2 puffs into the lungs every 6 (six) hours as needed for wheezing or shortness of breath. Patient not taking: Reported on 06/19/2017 04/28/17  01/07/19  Petrucelli, Pleas Koch, PA-C    Family History Family History  Problem Relation Age of Onset  . Healthy Mother   . Healthy Father   . Cancer Other        breast in [facility], great aunt   . Hypertension Other   . Diabetes Other     Social History Social History   Tobacco Use  . Smoking status: Current Every Day Smoker    Packs/day: 0.50    Years: 11.00    Pack years: 5.50    Types: Cigarettes  . Smokeless tobacco: Never Used  Substance Use Topics  . Alcohol use: No    Alcohol/week: 2.0 standard drinks    Types: 2 Shots of liquor per week    Comment: occ  . Drug use: No     Allergies   Patient has no known allergies.   Review of Systems Review of Systems   Physical Exam Triage Vital Signs ED Triage Vitals  Enc Vitals Group     BP 01/07/19 0831 136/76     Pulse Rate 01/07/19 0831 78     Resp 01/07/19 0831 15     Temp 01/07/19 0831 98 F (36.7 C)     Temp Source 01/07/19 0831 Oral     SpO2 01/07/19 0831 98 %     Weight --  Height --      Head Circumference --      Peak Flow --      Pain Score 01/07/19 0830 0     Pain Loc --      Pain Edu? --      Excl. in [place]? --    No data found.  Updated Vital Signs BP 136/76 (BP Location: Left Arm)   Pulse 78   Temp 98 F (36.7 C) (Oral)   Resp 15   SpO2 98%   Visual Acuity Right Eye Distance:   Left Eye Distance:   Bilateral Distance:    Right Eye Near:   Left Eye Near:    Bilateral Near:     Physical Exam Vitals signs and nursing note reviewed.  Constitutional:      General: She is not in acute distress.    Appearance: Normal appearance. She is not ill-appearing, toxic-appearing or diaphoretic.  HENT:     Head: Normocephalic.     Ears:     Comments: Bilateral retracted ear drums.     Nose: Nose normal.     Mouth/Throat:     Pharynx: Oropharynx is clear.  Eyes:     Conjunctiva/sclera: Conjunctivae normal.  Neck:     Musculoskeletal: Normal range of motion.  Pulmonary:      Effort: Pulmonary effort is normal.  Abdominal:     Palpations: Abdomen is soft.     Tenderness: There is no abdominal tenderness.  Musculoskeletal: Normal range of motion.  Skin:    General: Skin is warm and dry.     Findings: No rash.  Neurological:     Mental Status: She is alert.  Psychiatric:        Mood and Affect: Mood normal.      UC Treatments / Results  Labs (all labs ordered are listed, but only abnormal results are displayed) Labs Reviewed - No data to display  EKG   Radiology No results found.  Procedures Procedures (including critical care time)  Medications Ordered in UC Medications - No data to display  Initial Impression / Assessment and Plan / UC Course  I have reviewed the triage vital signs and the nursing notes.  Pertinent labs & imaging results that were available during my care of the patient were reviewed by me and considered in my medical decision making (see chart for details).     Tinnitus-most likely caused by eustachian tube dysfunction.  Recommended Flonase and Zyrtec daily. Also recommended follow-up with ear nose and throat specialist for continued symptoms Final Clinical Impressions(s) / UC Diagnoses   Final diagnoses:  Tinnitus of right ear     Discharge Instructions     I believe that the ringing in the ear is most liekly caused by allergies We will try some daily allergy medication to see if this helps If not seeing any relief in a few weeks you need to see an ear, nose and throat specialist.      ED Prescriptions    Medication Sig Dispense Auth. Provider   fluticasone (FLONASE) 50 MCG/ACT nasal spray Place 1 spray into both nostrils daily. 16 g Feliza Diven A, NP   cetirizine (ZYRTEC) 10 MG tablet Take 1 tablet (10 mg total) by mouth daily. 30 tablet Loura Halt A, NP     PDMP not reviewed this encounter.   Orvan July, NP 01/07/19 1135

## 2019-01-08 ENCOUNTER — Encounter (HOSPITAL_COMMUNITY): Payer: Self-pay | Admitting: Emergency Medicine

## 2019-01-08 ENCOUNTER — Other Ambulatory Visit: Payer: Self-pay

## 2019-01-08 ENCOUNTER — Emergency Department (HOSPITAL_COMMUNITY)
Admission: EM | Admit: 2019-01-08 | Discharge: 2019-01-09 | Disposition: A | Discharge status: Home / Self Care | Payer: No Typology Code available for payment source | Attending: Emergency Medicine | Admitting: Emergency Medicine

## 2019-01-08 ENCOUNTER — Emergency Department (HOSPITAL_COMMUNITY): Payer: No Typology Code available for payment source

## 2019-01-08 DIAGNOSIS — R0789 Other chest pain: Secondary | ICD-10-CM | POA: Insufficient documentation

## 2019-01-08 DIAGNOSIS — R208 Other disturbances of skin sensation: Secondary | ICD-10-CM | POA: Insufficient documentation

## 2019-01-08 DIAGNOSIS — F17210 Nicotine dependence, cigarettes, uncomplicated: Secondary | ICD-10-CM | POA: Insufficient documentation

## 2019-01-08 DIAGNOSIS — Z79899 Other long term (current) drug therapy: Secondary | ICD-10-CM | POA: Insufficient documentation

## 2019-01-08 DIAGNOSIS — R202 Paresthesia of skin: Secondary | ICD-10-CM | POA: Insufficient documentation

## 2019-01-08 LAB — I-STAT BETA HCG BLOOD, ED (MC, WL, AP ONLY): I-stat hCG, quantitative: <5 m[IU]/mL (ref <5)

## 2019-01-08 LAB — CBC
HCT: 45.1 % (ref 36.0–46.0)
Hemoglobin: 14.5 g/dL (ref 12.0–15.0)
MCH: 28.3 pg (ref 26.0–34.0)
MCHC: 32.2 g/dL (ref 30.0–36.0)
MCV: 88.1 fL (ref 80.0–100.0)
Platelets: 356 10*3/uL (ref 150–400)
RBC: 5.12 MIL/uL — ABNORMAL HIGH (ref 3.87–5.11)
RDW: 13.7 % (ref 11.5–15.5)
WBC: 7.7 10*3/uL (ref 4.0–10.5)
nRBC: 0 % (ref 0.0–0.2)

## 2019-01-08 LAB — BASIC METABOLIC PANEL
Anion gap: 11 (ref 5–15)
BUN: 10 mg/dL (ref 6–20)
CO2: 22 mmol/L (ref 22–32)
Calcium: 9.1 mg/dL (ref 8.9–10.3)
Chloride: 104 mmol/L (ref 98–111)
Creatinine, Ser: 1.18 mg/dL — ABNORMAL HIGH (ref 0.44–1.00)
GFR calc Af Amer: >60 mL/min (ref >60)
GFR calc non Af Amer: >60 mL/min (ref >60)
Glucose, Bld: 82 mg/dL (ref 70–99)
Potassium: 3.9 mmol/L (ref 3.5–5.1)
Sodium: 137 mmol/L (ref 135–145)

## 2019-01-08 LAB — TROPONIN I (HIGH SENSITIVITY): Troponin I (High Sensitivity): 3 ng/L (ref <18)

## 2019-01-08 MED ORDER — SODIUM CHLORIDE 0.9% FLUSH: 3.0000 mL | Freq: Once | INTRAVENOUS | Status: DC

## 2019-01-08 NOTE — ED Triage Notes (Signed)
Pt reports left sided cp that started 2 days ago. Reports nausea with sob. Reports tingling sensation radiating to left arm, neck and back. Pt reports she donated plasma yesterday.

## 2019-01-09 ENCOUNTER — Other Ambulatory Visit: Payer: Self-pay

## 2019-01-09 LAB — TROPONIN I (HIGH SENSITIVITY): Troponin I (High Sensitivity): <2 ng/L (ref <18)

## 2019-01-09 NOTE — ED Notes (Signed)
EDP at bedside  

## 2019-01-09 NOTE — ED Notes (Signed)
Discharge instructions discussed with pt. Pt verbalized understanding with no questions at this time.  

## 2019-01-09 NOTE — ED Provider Notes (Signed)
TIME SEEN: 5:06 AM  CHIEF COMPLAINT: Chest pain, left-sided tingling and coolness in the left side of the body  HPI: Patient is a 33 year old female with history of obesity who presents to the emergency department with left-sided sharp chest pain without aggravating or alleviating factors it started 2 days ago.  She denies to me shortness of breath, nausea, vomiting, diaphoresis or dizziness despite nursing notes.  No fevers, cough, congestion.  No history of PE, DVT, exogenous estrogen use, recent fractures, surgery, trauma, hospitalization or prolonged travel. No lower extremity swelling or pain. No calf tenderness.  No family history of premature CAD.  She is a smoker.  She has no history of hypertension, diabetes or hyperlipidemia.  Chest pain now resolved.  Patient also states that she started having tingling in her tongue, part of her left arm and down her left leg.  She states she felt a "coolness" throughout the left side of her body and has had similar symptoms when she has donated plasma but usually resolve within hours and the symptoms lasted.  She states the coolness however is now gone and she is not having any numbness or weakness.  She was concerned that she was having a stroke.  No history of stroke, TIA.  No headache or head injury.  Not on blood thinners.  ROS: See HPI Constitutional: no fever  Eyes: no drainage  ENT: no runny nose   Cardiovascular:  no chest pain  Resp: no SOB  GI: no vomiting GU: no dysuria Integumentary: no rash  Allergy: no hives  Musculoskeletal: no leg swelling  Neurological: no slurred speech ROS otherwise negative  PAST MEDICAL HISTORY/PAST SURGICAL HISTORY:  Past Medical History:  Diagnosis Date  . Abscess   . Anxiety Dx 2013  . Depression Dx 2013  . Obesity     MEDICATIONS:  Prior to Admission medications   Medication Sig Start Date End Date Taking? Authorizing Provider  cetirizine (ZYRTEC) 10 MG tablet Take 1 tablet (10 mg total) by  mouth daily. 01/07/19   Bast, Tressia Miners A, NP  fluticasone (FLONASE) 50 MCG/ACT nasal spray Place 1 spray into both nostrils daily. 01/07/19   Loura Halt A, NP  albuterol (PROVENTIL HFA;VENTOLIN HFA) 108 (90 Base) MCG/ACT inhaler Inhale 1-2 puffs into the lungs every 6 (six) hours as needed for wheezing or shortness of breath. Patient not taking: Reported on 06/19/2017 04/28/17 01/07/19  Petrucelli, Glynda Jaeger, PA-C    ALLERGIES:  No Known Allergies  SOCIAL HISTORY:  Social History   Tobacco Use  . Smoking status: Current Every Day Smoker    Packs/day: 0.50    Years: 11.00    Pack years: 5.50    Types: Cigarettes  . Smokeless tobacco: Never Used  Substance Use Topics  . Alcohol use: No    Alcohol/week: 2.0 standard drinks    Types: 2 Shots of liquor per week    Comment: occ    FAMILY HISTORY: Family History  Problem Relation Age of Onset  . Healthy Mother   . Healthy Father   . Cancer Other        breast in [facility], great aunt   . Hypertension Other   . Diabetes Other     EXAM: BP 134/64   Pulse 73   Temp 98.5 F (36.9 C) (Oral)   Resp (!) 21   Ht 5\' 7"  (1.702 m)   Wt (!) 142.9 kg   LMP 12/22/2018   SpO2 97%   BMI 49.34 kg/m  CONSTITUTIONAL: Alert and oriented and responds appropriately to questions. Well-appearing; well-nourished, obese HEAD: Normocephalic EYES: Conjunctivae clear, pupils appear equal, EOMI ENT: normal nose; moist mucous membranes NECK: Supple, no meningismus, no nuchal rigidity, no LAD  CARD: RRR; S1 and S2 appreciated; no murmurs, no clicks, no rubs, no gallops RESP: Normal chest excursion without splinting or tachypnea; breath sounds clear and equal bilaterally; no wheezes, no rhonchi, no rales, no hypoxia or respiratory distress, speaking full sentences ABD/GI: Normal bowel sounds; non-distended; soft, non-tender, no rebound, no guarding, no peritoneal signs, no hepatosplenomegaly BACK:  The back appears normal and is non-tender to palpation,  there is no CVA tenderness EXT: Normal ROM in all joints; non-tender to palpation; no edema; normal capillary refill; no cyanosis, no calf tenderness or swelling    SKIN: Normal color for age and race; warm; no rash NEURO: Moves all extremities equally, normal sensation diffusely, strength 5/5 in all 4 extremities, cranial nerves II through XII intact, normal speech, normal gait PSYCH: The patient's mood and manner are appropriate. Grooming and personal hygiene are appropriate.  MEDICAL DECISION MAKING: Patient here with atypical chest pain that has resolved.  She has had 2 troponins in triage that are negative.  Chest x-ray clear.  EKG shows no ischemia, arrhythmia or interval abnormality.  Doubt ACS.  She is PERC negative.  Doubt PE.  Doubt dissection.  No sign of pneumonia or pneumothorax or edema on chest x-ray.  Also describes some tingling and intermittent parts of the left side of her body with a "coolness".  She has no focal neurologic deficits currently and denies numbness or weakness.  Seems very atypical for a stroke.  Doubt Guillain Barr, transverse myelitis, multiple sclerosis, radiculopathy, spinal stenosis, cervical myelopathy, epidural abscess or hematoma, discitis or osteomyelitis.  I feel patient is safe to be discharged home without further emergent work-up.  Given PCP information.  Discussed return precautions.  She is comfortable with this plan.   At this time, I do not feel there is any life-threatening condition present. I have reviewed and discussed all results (EKG, imaging, lab, urine as appropriate) and exam findings with patient/family. I have reviewed nursing notes and appropriate previous records.  I feel the patient is safe to be discharged home without further emergent workup and can continue workup as an outpatient as needed. Discussed usual and customary return precautions. Patient/family verbalize understanding and are comfortable with this plan.  Outpatient follow-up  has been provided as needed. All questions have been answered.   Kimberly Contreras was evaluated in Emergency Department on 01/09/2019 for the symptoms described in the history of present illness. She was evaluated in the context of the global COVID-19 pandemic, which necessitated consideration that the patient might be at risk for infection with the SARS-CoV-2 virus that causes COVID-19. Institutional protocols and algorithms that pertain to the evaluation of patients at risk for COVID-19 are in a state of rapid change based on information released by regulatory bodies including the CDC and federal and state organizations. These policies and algorithms were followed during the patient's care in the ED.    EKG Interpretation  Date/Time:  Friday January 08 2019 21:46:37 EDT Ventricular Rate:  83 PR Interval:  166 QRS Duration: 84 QT Interval:  376 QTC Calculation: 441 R Axis:   72 Text Interpretation:  Normal sinus rhythm Normal ECG No significant change since last tracing Confirmed by Rochele RaringWard, Gabriell Daigneault [phone]([id number]) on 01/09/2019 5:06:43 AM         Nikitha Mode, Baxter HireKristen  N, DO 01/09/19 9163

## 2019-01-09 NOTE — Discharge Instructions (Addendum)
Steps to find a Primary Care Provider (PCP): ° °Call [phone] or [phone] to access "[facility] Find a Doctor Service." ° °2.  You may also go on the [facility] website at [email or web address] ° °3.  [facility] also frequently accepts new patients. ° ° and [facility]  °[address] °[place] [address] °[phone] ° °4.  There are also multiple [facility], [facility], [facility] and [facility] practices throughout the [place] that are frequently accepting new patients. You may find a clinic that is close to your home and contact them. ° °[facility] °[email or web address] °[phone] ° °[facility] °[email or web address] ° °[facility] °[email or web address] °[phone] ° °[facility] °[email or web address] °[phone] ° °5.  Local Health Departments also can provide primary care services. ° °[facility]  °[address] °[place] [address] °[phone] ° °[facility] °[address] °[place] [address] °[phone] ° °[facility] °[address]  °[place]  [address] °[phone] ° ° °

## 2019-11-12 ENCOUNTER — Ambulatory Visit (HOSPITAL_COMMUNITY)
Admission: EM | Admit: 2019-11-12 | Discharge: 2019-11-12 | Disposition: A | Discharge status: Home / Self Care | Payer: No Typology Code available for payment source | Attending: Physician Assistant | Admitting: Physician Assistant

## 2019-11-12 ENCOUNTER — Encounter (HOSPITAL_COMMUNITY): Payer: Self-pay

## 2019-11-12 ENCOUNTER — Other Ambulatory Visit: Payer: Self-pay

## 2019-11-12 DIAGNOSIS — M25562 Pain in left knee: Secondary | ICD-10-CM

## 2019-11-12 MED ORDER — NAPROXEN 500 MG PO TABS: 500.0000 mg | ORAL_TABLET | Freq: Two times a day (BID) | ORAL | 0 refills | Status: DC

## 2019-11-12 MED ORDER — DICLOFENAC SODIUM 1 % EX GEL: 4.0000 g | Freq: Four times a day (QID) | CUTANEOUS | 0 refills | Status: DC

## 2019-11-12 NOTE — Discharge Instructions (Signed)
Take the medication as prescribed with food Use gel  as prescribed Use brace Call sports medicine group Monday for follow up  Ice in the evenings after work

## 2019-11-12 NOTE — ED Provider Notes (Signed)
[facility]    CSN: [id number] Arrival date & time: 11/12/19  1628      History   Chief Complaint Chief Complaint  Patient presents with  . Knee Pain    Left     HPI Inari Shin is a 34 y.o. female.   Patient reports for evaluation of left knee pain.  This is been present for the last 3 to 4 weeks.  She reports injuring the knee and twisting it several weeks ago prompting an emergency department visit to an outside facility.  She reports at that visit she had x-rays that did not show anything.  She was placed in a brace and discharged.  She reports since then she has had continued pain in the front of the knee and on the side.  She reports no pain at rest.  Pain is worse with walking or standing for prolonged periods.  She has not had any swelling around the knee or in the leg.  No new injuries.  There is been no redness.  There is been no fevers or chills.  She reports the brace helps minimally.  She has tried some over-the-counter medicines.  She has not had follow-up for the knee directly.     Past Medical History:  Diagnosis Date  . Abscess   . Anxiety Dx 2013  . Depression Dx 2013  . Obesity     Patient Active Problem List   Diagnosis Date Noted  . Severe obesity (BMI >= 40) (HCC) 02/23/2014  . Homeless single person 02/23/2014  . Acne 02/23/2014  . Current smoker 02/23/2014  . MDD (major depressive disorder), recurrent severe, with psychosis 11/25/2012  . Generalized anxiety disorder 11/25/2012  . Pilonidal cyst without abscess 08/19/2011    Past Surgical History:  Procedure Laterality Date  . APPENDECTOMY  2013     OB History   No obstetric history on file.      Home Medications    Prior to Admission medications   Medication Sig Start Date End Date Taking? Authorizing Provider  cetirizine (ZYRTEC) 10 MG tablet Take 1 tablet (10 mg total) by mouth daily. 01/07/19   Dahlia Byes A, NP  diclofenac Sodium (VOLTAREN) 1 % GEL Apply 4 g topically  4 (four) times daily. 11/12/19   Matie Dimaano, Veryl Speak, PA-C  fluticasone (FLONASE) 50 MCG/ACT nasal spray Place 1 spray into both nostrils daily. 01/07/19   Dahlia Byes A, NP  naproxen (NAPROSYN) 500 MG tablet Take 1 tablet (500 mg total) by mouth 2 (two) times daily. 11/12/19   Janoah Menna, Veryl Speak, PA-C  albuterol (PROVENTIL HFA;VENTOLIN HFA) 108 (90 Base) MCG/ACT inhaler Inhale 1-2 puffs into the lungs every 6 (six) hours as needed for wheezing or shortness of breath. Patient not taking: Reported on 06/19/2017 04/28/17 01/07/19  Petrucelli, Pleas Koch, PA-C    Family History Family History  Problem Relation Age of Onset  . Healthy Mother   . Healthy Father   . Cancer Other        breast in [facility], great aunt   . Hypertension Other   . Diabetes Other     Social History Social History   Tobacco Use  . Smoking status: Current Every Day Smoker    Packs/day: 0.50    Years: 11.00    Pack years: 5.50    Types: Cigarettes  . Smokeless tobacco: Never Used  Vaping Use  . Vaping Use: Never used  Substance Use Topics  . Alcohol use: No  Alcohol/week: 2.0 standard drinks    Types: 2 Shots of liquor per week    Comment: occ  . Drug use: No     Allergies   Patient has no known allergies.   Review of Systems Review of Systems   Physical Exam Triage Vital Signs ED Triage Vitals  Enc Vitals Group     BP 11/12/19 1839 (!) 143/55     Pulse Rate 11/12/19 1839 76     Resp 11/12/19 1839 18     Temp 11/12/19 1839 98.3 F (36.8 C)     Temp Source 11/12/19 1839 Oral     SpO2 11/12/19 1839 100 %     Weight --      Height --      Head Circumference --      Peak Flow --      Pain Score 11/12/19 1840 8     Pain Loc --      Pain Edu? --      Excl. in GC? --    No data found.  Updated Vital Signs BP (!) 143/55 (BP Location: Left Arm)   Pulse 76   Temp 98.3 F (36.8 C) (Oral)   Resp 18   LMP 10/25/2019   SpO2 100%   Visual Acuity Right Eye Distance:   Left Eye Distance:   Bilateral  Distance:    Right Eye Near:   Left Eye Near:    Bilateral Near:     Physical Exam Vitals and nursing note reviewed.  Constitutional:      General: She is not in acute distress.    Appearance: She is well-developed. She is obese.  HENT:     Head: Normocephalic and atraumatic.  Cardiovascular:     Rate and Rhythm: Normal rate.  Pulmonary:     Effort: Pulmonary effort is normal. No respiratory distress.  Musculoskeletal:     Cervical back: Neck supple.     Comments: Left knee without obvious swelling or deformity.  Difficult to discern effusion due to body habitus.  Patient has full range of motion of the left knee and is able to bear weight.  No bony tenderness on palpation.  No anterior joint line tenderness.  Some medial tenderness primarily in the soft tissues just inferior to the knee.  No pain with patellar compression.  Patella is freely mobile.  Stable to anterior and posterior drawer.  No crepitus appreciated on McMurray's.  Skin:    General: Skin is warm and dry.  Neurological:     Mental Status: She is alert.      UC Treatments / Results  Labs (all labs ordered are listed, but only abnormal results are displayed) Labs Reviewed - No data to display  EKG   Radiology No results found.  Procedures Procedures (including critical care time)  Medications Ordered in UC Medications - No data to display  Initial Impression / Assessment and Plan / UC Course  I have reviewed the triage vital signs and the nursing notes.  Pertinent labs & imaging results that were available during my care of the patient were reviewed by me and considered in my medical decision making (see chart for details).     #Left knee pain Patient is a 34 year old presenting with left knee pain.  Recommend she continues use of the brace she was discharged with.  We will start her on naproxen and use topical Voltaren.  We will have her follow-up with sports medicine group For further evaluation.  Patient verbalized agreement understanding plan of care. Final Clinical Impressions(s) / UC Diagnoses   Final diagnoses:  Acute pain of left knee     Discharge Instructions     Take the medication as prescribed with food Use gel  as prescribed Use brace Call sports medicine group Monday for follow up  Ice in the evenings after work      ED Prescriptions    Medication Sig Dispense Auth. Provider   naproxen (NAPROSYN) 500 MG tablet Take 1 tablet (500 mg total) by mouth 2 (two) times daily. 30 tablet Chetara Kropp, Veryl Speak, PA-C   diclofenac Sodium (VOLTAREN) 1 % GEL Apply 4 g topically 4 (four) times daily. 150 g Murlin Schrieber, Veryl Speak, PA-C     PDMP not reviewed this encounter.   Hermelinda Medicus, PA-C 11/12/19 2327

## 2019-11-12 NOTE — ED Triage Notes (Signed)
Pt present left knee pain, symptoms started 4 weeks ago.pt states it hurts to stand on her knee and she has stabbing pains on the side of her knee.

## 2020-06-15 ENCOUNTER — Encounter (HOSPITAL_COMMUNITY): Payer: Self-pay | Admitting: Emergency Medicine

## 2020-06-15 ENCOUNTER — Other Ambulatory Visit: Payer: Self-pay

## 2020-06-15 ENCOUNTER — Ambulatory Visit (HOSPITAL_COMMUNITY)
Admission: EM | Admit: 2020-06-15 | Discharge: 2020-06-15 | Disposition: A | Discharge status: Home / Self Care | Payer: Self-pay | Attending: Emergency Medicine | Admitting: Emergency Medicine

## 2020-06-15 DIAGNOSIS — R300 Dysuria: Secondary | ICD-10-CM | POA: Insufficient documentation

## 2020-06-15 DIAGNOSIS — Z3202 Encounter for pregnancy test, result negative: Secondary | ICD-10-CM

## 2020-06-15 DIAGNOSIS — Z113 Encounter for screening for infections with a predominantly sexual mode of transmission: Secondary | ICD-10-CM

## 2020-06-15 DIAGNOSIS — N898 Other specified noninflammatory disorders of vagina: Secondary | ICD-10-CM | POA: Insufficient documentation

## 2020-06-15 LAB — POCT URINALYSIS DIPSTICK, ED / UC
Bilirubin Urine: NEGATIVE
Glucose, UA: NEGATIVE mg/dL
Hgb urine dipstick: NEGATIVE
Ketones, ur: NEGATIVE mg/dL
Leukocytes,Ua: NEGATIVE
Nitrite: NEGATIVE
Protein, ur: NEGATIVE mg/dL
Specific Gravity, Urine: 1.015 (ref 1.005–1.030)
Urobilinogen, UA: 0.2 mg/dL (ref 0.0–1.0)
pH: 5.5 (ref 5.0–8.0)

## 2020-06-15 LAB — POC URINE PREG, ED: Preg Test, Ur: NEGATIVE

## 2020-06-15 NOTE — ED Triage Notes (Signed)
Pt presents today with c/o of burning sensation to vagina and brown discharge x 1 week. She does report having unprotected sex. No relief with Monistat or cranberry AZO.

## 2020-06-15 NOTE — ED Provider Notes (Signed)
[facility]    CSN: [id number] Arrival date & time: 06/15/20  1243      History   Chief Complaint Chief Complaint  Patient presents with  . SEXUALLY TRANSMITTED DISEASE  . Dysuria    HPI Kimberly Contreras is a 35 y.o. female.   Patient presents with 1 week history of brown vaginal discharge.  She also reports burning with urination and burning sensation in her perineum.  The burning occurs with sexual contact and when she eats spicy foods.  She states she has a new boyfriend and is sexually active without condoms.  She denies fever, chills, abdominal pain, pelvic pain, hematuria, back pain, rash, lesions, or other symptoms.  Treatment attempted at home with Monistat and cranberry Azo.  Her medical history includes severe obesity, depression, anxiety, current smoker.  The history is provided by the patient and medical records.    Past Medical History:  Diagnosis Date  . Abscess   . Anxiety Dx 2013  . Depression Dx 2013  . Obesity     Patient Active Problem List   Diagnosis Date Noted  . Severe obesity (BMI >= 40) (HCC) 02/23/2014  . Homeless single person 02/23/2014  . Acne 02/23/2014  . Current smoker 02/23/2014  . MDD (major depressive disorder), recurrent severe, with psychosis 11/25/2012  . Generalized anxiety disorder 11/25/2012  . Pilonidal cyst without abscess 08/19/2011    Past Surgical History:  Procedure Laterality Date  . APPENDECTOMY  2013     OB History   No obstetric history on file.      Home Medications    Prior to Admission medications   Medication Sig Start Date End Date Taking? Authorizing Provider  cetirizine (ZYRTEC) 10 MG tablet Take 1 tablet (10 mg total) by mouth daily. 01/07/19  Yes Bast, Traci A, NP  diclofenac Sodium (VOLTAREN) 1 % GEL Apply 4 g topically 4 (four) times daily. 11/12/19   Darr, Gerilyn Pilgrim, PA-C  fluticasone (FLONASE) 50 MCG/ACT nasal spray Place 1 spray into both nostrils daily. 01/07/19   Dahlia Byes A, NP   naproxen (NAPROSYN) 500 MG tablet Take 1 tablet (500 mg total) by mouth 2 (two) times daily. 11/12/19   Darr, Gerilyn Pilgrim, PA-C  albuterol (PROVENTIL HFA;VENTOLIN HFA) 108 (90 Base) MCG/ACT inhaler Inhale 1-2 puffs into the lungs every 6 (six) hours as needed for wheezing or shortness of breath. Patient not taking: Reported on 06/19/2017 04/28/17 01/07/19  Petrucelli, Pleas Koch, PA-C    Family History Family History  Problem Relation Age of Onset  . Healthy Mother   . Healthy Father   . Cancer Other        breast in [facility], great aunt   . Hypertension Other   . Diabetes Other     Social History Social History   Tobacco Use  . Smoking status: Current Every Day Smoker    Packs/day: 0.50    Years: 11.00    Pack years: 5.50    Types: Cigarettes  . Smokeless tobacco: Never Used  Vaping Use  . Vaping Use: Never used  Substance Use Topics  . Alcohol use: No    Alcohol/week: 2.0 standard drinks    Types: 2 Shots of liquor per week    Comment: occ  . Drug use: No     Allergies   Patient has no known allergies.   Review of Systems Review of Systems  Constitutional: Negative for chills and fever.  HENT: Negative for ear pain and sore throat.  Eyes: Negative for pain and visual disturbance.  Respiratory: Negative for cough and shortness of breath.   Cardiovascular: Negative for chest pain and palpitations.  Gastrointestinal: Negative for abdominal pain and vomiting.  Genitourinary: Positive for dysuria and vaginal discharge. Negative for flank pain, hematuria and pelvic pain.  Musculoskeletal: Negative for arthralgias and back pain.  Skin: Negative for color change and rash.  Neurological: Negative for seizures and syncope.  All other systems reviewed and are negative.    Physical Exam Triage Vital Signs ED Triage Vitals  Enc Vitals Group     BP      Pulse      Resp      Temp      Temp src      SpO2      Weight      Height      Head Circumference      Peak Flow       Pain Score      Pain Loc      Pain Edu?      Excl. in GC?    No data found.  Updated Vital Signs BP (!) 144/67 (BP Location: Right Arm)   Pulse 73   Temp 98.1 F (36.7 C) (Oral)   Resp 20   LMP 06/11/2020   SpO2 99%   Visual Acuity Right Eye Distance:   Left Eye Distance:   Bilateral Distance:    Right Eye Near:   Left Eye Near:    Bilateral Near:     Physical Exam Vitals and nursing note reviewed.  Constitutional:      General: She is not in acute distress.    Appearance: She is well-developed. She is obese. She is not ill-appearing.  HENT:     Head: Normocephalic and atraumatic.     Mouth/Throat:     Mouth: Mucous membranes are moist.  Eyes:     Conjunctiva/sclera: Conjunctivae normal.  Cardiovascular:     Rate and Rhythm: Normal rate and regular rhythm.     Heart sounds: Normal heart sounds.  Pulmonary:     Effort: Pulmonary effort is normal. No respiratory distress.     Breath sounds: Normal breath sounds.  Abdominal:     General: Bowel sounds are normal.     Palpations: Abdomen is soft.     Tenderness: There is no abdominal tenderness. There is no right CVA tenderness, left CVA tenderness, guarding or rebound.  Musculoskeletal:     Cervical back: Neck supple.  Skin:    General: Skin is warm and dry.  Neurological:     General: No focal deficit present.     Mental Status: She is alert and oriented to person, place, and time.     Gait: Gait normal.  Psychiatric:        Mood and Affect: Mood normal.        Behavior: Behavior normal.      UC Treatments / Results  Labs (all labs ordered are listed, but only abnormal results are displayed) Labs Reviewed  POCT URINALYSIS DIPSTICK, ED / UC  POC URINE PREG, ED  CERVICOVAGINAL ANCILLARY ONLY    EKG   Radiology No results found.  Procedures Procedures (including critical care time)  Medications Ordered in UC Medications - No data to display  Initial Impression / Assessment and Plan / UC  Course  I have reviewed the triage vital signs and the nursing notes.  Pertinent labs & imaging results that were available during my  care of the patient were reviewed by me and considered in my medical decision making (see chart for details).   Vaginal discharge, dysuria.  Patient is well-appearing and her exam is reassuring.  Urine normal.  Urine pregnancy negative.  Patient obtained vaginal self swab for testing.  Discussed with patient that she may require treatment if her test results are positive.  Discussed that her sexual partner may also require treatment.  Instructed her to abstain from sexual activity for at least 7 days.  Instructed her to follow-up with her PCP or OB/GYN if her symptoms are not improving.  Patient agrees to plan of care.   Final Clinical Impressions(s) / UC Diagnoses   Final diagnoses:  Vaginal discharge  Dysuria     Discharge Instructions     Your urine does not show signs of infection.  Your pregnancy test is negative.    Your vaginal tests are pending.  If your test results are positive, we will call you.  You and your sexual partner(s) may require treatment at that time.  Do not have sexual activity for at least 7 days.    Follow up with your primary care provider if your symptoms are not improving.          ED Prescriptions    None     PDMP not reviewed this encounter.   Mickie Bail, NP 06/15/20 1335

## 2020-06-15 NOTE — Discharge Instructions (Signed)
Your urine does not show signs of infection.  Your pregnancy test is negative.    Your vaginal tests are pending.  If your test results are positive, we will call you.  You and your sexual partner(s) may require treatment at that time.  Do not have sexual activity for at least 7 days.    Follow up with your primary care provider if your symptoms are not improving.

## 2020-06-16 LAB — CERVICOVAGINAL ANCILLARY ONLY
Bacterial Vaginitis (gardnerella): NEGATIVE
Candida Glabrata: NEGATIVE
Candida Vaginitis: NEGATIVE
Chlamydia: NEGATIVE
Comment: NEGATIVE
Comment: NEGATIVE
Comment: NEGATIVE
Comment: NEGATIVE
Comment: NEGATIVE
Comment: NORMAL
Neisseria Gonorrhea: NEGATIVE
Trichomonas: NEGATIVE

## 2020-07-16 NOTE — Progress Notes (Signed)
Patient did not show for appointment.   

## 2020-07-17 ENCOUNTER — Other Ambulatory Visit: Payer: Self-pay

## 2020-07-17 ENCOUNTER — Telehealth: Payer: Self-pay

## 2020-07-17 ENCOUNTER — Encounter: Payer: No Typology Code available for payment source | Admitting: Family

## 2020-07-17 DIAGNOSIS — Z7689 Persons encountering health services in other specified circumstances: Secondary | ICD-10-CM

## 2020-07-17 NOTE — Telephone Encounter (Signed)
ATT TO CONTACT PT FOR VIRTUAL VISIT -NO ANS PHONE RINGS BUSY

## 2020-08-06 ENCOUNTER — Encounter (HOSPITAL_COMMUNITY): Payer: Self-pay | Admitting: *Deleted

## 2020-08-06 ENCOUNTER — Encounter (HOSPITAL_COMMUNITY): Payer: Self-pay | Admitting: Emergency Medicine

## 2020-08-06 ENCOUNTER — Emergency Department (HOSPITAL_COMMUNITY)
Admission: EM | Admit: 2020-08-06 | Discharge: 2020-08-06 | Disposition: A | Discharge status: Home / Self Care | Payer: No Typology Code available for payment source | Attending: Emergency Medicine | Admitting: Emergency Medicine

## 2020-08-06 ENCOUNTER — Ambulatory Visit (HOSPITAL_COMMUNITY)
Admission: EM | Admit: 2020-08-06 | Discharge: 2020-08-06 | Disposition: A | Discharge status: Home / Self Care | Payer: No Typology Code available for payment source | Attending: Sports Medicine | Admitting: Sports Medicine

## 2020-08-06 ENCOUNTER — Emergency Department (HOSPITAL_COMMUNITY): Payer: No Typology Code available for payment source

## 2020-08-06 ENCOUNTER — Other Ambulatory Visit: Payer: Self-pay

## 2020-08-06 DIAGNOSIS — Z7982 Long term (current) use of aspirin: Secondary | ICD-10-CM | POA: Insufficient documentation

## 2020-08-06 DIAGNOSIS — W503XXA Accidental bite by another person, initial encounter: Secondary | ICD-10-CM | POA: Insufficient documentation

## 2020-08-06 DIAGNOSIS — S61259A Open bite of unspecified finger without damage to nail, initial encounter: Secondary | ICD-10-CM

## 2020-08-06 DIAGNOSIS — L089 Local infection of the skin and subcutaneous tissue, unspecified: Secondary | ICD-10-CM

## 2020-08-06 DIAGNOSIS — L03011 Cellulitis of right finger: Secondary | ICD-10-CM | POA: Insufficient documentation

## 2020-08-06 DIAGNOSIS — F17210 Nicotine dependence, cigarettes, uncomplicated: Secondary | ICD-10-CM | POA: Insufficient documentation

## 2020-08-06 DIAGNOSIS — S61250A Open bite of right index finger without damage to nail, initial encounter: Secondary | ICD-10-CM | POA: Insufficient documentation

## 2020-08-06 DIAGNOSIS — Z23 Encounter for immunization: Secondary | ICD-10-CM | POA: Insufficient documentation

## 2020-08-06 DIAGNOSIS — L03019 Cellulitis of unspecified finger: Secondary | ICD-10-CM

## 2020-08-06 LAB — BASIC METABOLIC PANEL
Anion gap: 9 (ref 5–15)
BUN: 6 mg/dL (ref 6–20)
CO2: 23 mmol/L (ref 22–32)
Calcium: 8.9 mg/dL (ref 8.9–10.3)
Chloride: 104 mmol/L (ref 98–111)
Creatinine, Ser: 0.82 mg/dL (ref 0.44–1.00)
GFR, Estimated: >60 mL/min (ref >60)
Glucose, Bld: 81 mg/dL (ref 70–99)
Potassium: 3.7 mmol/L (ref 3.5–5.1)
Sodium: 136 mmol/L (ref 135–145)

## 2020-08-06 LAB — CBC WITH DIFFERENTIAL/PLATELET
Abs Immature Granulocytes: 0.04 10*3/uL (ref 0.00–0.07)
Basophils Absolute: 0 10*3/uL (ref 0.0–0.1)
Basophils Relative: 0 %
Eosinophils Absolute: 0.1 10*3/uL (ref 0.0–0.5)
Eosinophils Relative: 1 %
HCT: 46.1 % — ABNORMAL HIGH (ref 36.0–46.0)
Hemoglobin: 14.7 g/dL (ref 12.0–15.0)
Immature Granulocytes: 0 %
Lymphocytes Relative: 12 %
Lymphs Abs: 1.5 10*3/uL (ref 0.7–4.0)
MCH: 27.9 pg (ref 26.0–34.0)
MCHC: 31.9 g/dL (ref 30.0–36.0)
MCV: 87.6 fL (ref 80.0–100.0)
Monocytes Absolute: 0.9 10*3/uL (ref 0.1–1.0)
Monocytes Relative: 7 %
Neutro Abs: 9.8 10*3/uL — ABNORMAL HIGH (ref 1.7–7.7)
Neutrophils Relative %: 80 %
Platelets: 358 10*3/uL (ref 150–400)
RBC: 5.26 MIL/uL — ABNORMAL HIGH (ref 3.87–5.11)
RDW: 14 % (ref 11.5–15.5)
WBC: 12.3 10*3/uL — ABNORMAL HIGH (ref 4.0–10.5)
nRBC: 0 % (ref 0.0–0.2)

## 2020-08-06 LAB — I-STAT BETA HCG BLOOD, ED (MC, WL, AP ONLY): I-stat hCG, quantitative: <5 m[IU]/mL (ref <5)

## 2020-08-06 MED ORDER — LIDOCAINE HCL 2 % IJ SOLN
10.0000 mL | Freq: Once | INTRAMUSCULAR | Status: AC
  Administered 2020-08-06: 200 mg
  Filled 2020-08-06: qty 20

## 2020-08-06 MED ORDER — AMOXICILLIN-POT CLAVULANATE 875-125 MG PO TABS
1.0000 | ORAL_TABLET | Freq: Once | ORAL | Status: AC
  Administered 2020-08-06: 1 via ORAL
  Filled 2020-08-06: qty 1

## 2020-08-06 MED ORDER — HYDROCODONE-ACETAMINOPHEN 5-325 MG PO TABS: 1.0000 | ORAL_TABLET | Freq: Four times a day (QID) | ORAL | 0 refills | Status: DC | PRN

## 2020-08-06 MED ORDER — OXYCODONE-ACETAMINOPHEN 5-325 MG PO TABS
1.0000 | ORAL_TABLET | Freq: Once | ORAL | Status: AC
  Administered 2020-08-06: 1 via ORAL
  Filled 2020-08-06: qty 1

## 2020-08-06 MED ORDER — AMOXICILLIN-POT CLAVULANATE 875-125 MG PO TABS: 1.0000 | ORAL_TABLET | Freq: Two times a day (BID) | ORAL | 0 refills | Status: DC

## 2020-08-06 MED ORDER — TETANUS-DIPHTH-ACELL PERTUSSIS 5-2.5-18.5 LF-MCG/0.5 IM SUSY
0.5000 mL | PREFILLED_SYRINGE | Freq: Once | INTRAMUSCULAR | Status: AC
  Administered 2020-08-06: 0.5 mL via INTRAMUSCULAR
  Filled 2020-08-06: qty 0.5

## 2020-08-06 NOTE — Discharge Instructions (Addendum)
I discussed your case with the on-call orthopedic hand physician who wants to go to the emergency room for further evaluation and IV antibiotics and possible admission for a surgical washout of your finger.
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
## 2020-08-12 LAB — AEROBIC CULTURE W GRAM STAIN (SUPERFICIAL SPECIMEN): Gram Stain: NONE SEEN

## 2020-08-13 ENCOUNTER — Telehealth: Payer: Self-pay | Admitting: Emergency Medicine

## 2020-08-13 NOTE — Telephone Encounter (Signed)
Post ED Visit - Positive Culture Follow-up  Culture report reviewed by antimicrobial stewardship pharmacist: [facility] []  , Pharm.D. []  Enzo Bi, Pharm.D., BCPS AQ-ID []  , Pharm.D., BCPS []  Celedonio Miyamoto, .D., BCPS []  [place], .D., BCPS, AAHIVP []  Georgina Pillion, Pharm.D., BCPS, AAHIVP []  [address], PharmD, BCPS []  , PharmD, BCPS []  [place], PharmD, BCPS []  [address], PharmD []  , PharmD, BCPS [x]  Estella Husk, PharmD  Pharmacy Team []  Lysle Pearl, PharmD []  , PharmD []  Phillips Climes, PharmD []  , Rph []  Agapito Games) , PharmD []  Verlan Friends, PharmD []  , PharmD []  Mervyn Gay, PharmD []  , PharmD []  Vinnie Level, PharmD []  [facility], PharmD []  , PharmD []  Len Childs, PharmD   Positive aerobic culture Treated with Amoxicillin-Pot Clavulanate, organism sensitive to the same and no further patient follow-up is required at this time.  08/13/2020, 5:54 PM

## 2020-08-23 ENCOUNTER — Encounter (HOSPITAL_BASED_OUTPATIENT_CLINIC_OR_DEPARTMENT_OTHER): Payer: Self-pay | Admitting: Obstetrics and Gynecology

## 2020-08-23 ENCOUNTER — Other Ambulatory Visit: Payer: Self-pay

## 2020-08-23 ENCOUNTER — Emergency Department (HOSPITAL_BASED_OUTPATIENT_CLINIC_OR_DEPARTMENT_OTHER)

## 2020-08-23 ENCOUNTER — Emergency Department (HOSPITAL_BASED_OUTPATIENT_CLINIC_OR_DEPARTMENT_OTHER)
Admission: EM | Admit: 2020-08-23 | Discharge: 2020-08-23 | Disposition: A | Discharge status: Home / Self Care | Attending: Emergency Medicine | Admitting: Emergency Medicine

## 2020-08-23 DIAGNOSIS — S61200D Unspecified open wound of right index finger without damage to nail, subsequent encounter: Secondary | ICD-10-CM | POA: Insufficient documentation

## 2020-08-23 DIAGNOSIS — W503XXD Accidental bite by another person, subsequent encounter: Secondary | ICD-10-CM | POA: Insufficient documentation

## 2020-08-23 DIAGNOSIS — L089 Local infection of the skin and subcutaneous tissue, unspecified: Secondary | ICD-10-CM

## 2020-08-23 DIAGNOSIS — F17210 Nicotine dependence, cigarettes, uncomplicated: Secondary | ICD-10-CM | POA: Insufficient documentation

## 2020-08-23 MED ORDER — CLINDAMYCIN HCL 300 MG PO CAPS: 300.0000 mg | ORAL_CAPSULE | Freq: Four times a day (QID) | ORAL | 0 refills | Status: DC

## 2020-08-23 MED ORDER — CLINDAMYCIN HCL 150 MG PO CAPS
300.0000 mg | ORAL_CAPSULE | Freq: Once | ORAL | Status: AC
  Administered 2020-08-23: 300 mg via ORAL
  Filled 2020-08-23: qty 2

## 2020-08-23 MED ORDER — CLINDAMYCIN HCL 300 MG PO CAPS: 300.0000 mg | ORAL_CAPSULE | Freq: Four times a day (QID) | ORAL | 0 refills | Status: AC
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
## 2020-08-23 NOTE — Discharge Instructions (Addendum)
Continue warm water and soap soaks twice a day and try to express out any more fluid from the tip of your finger as discussed.  Recommend putting bacitracin ointment on the tip of your finger once daily.  Please keep your finger dry and clean and covered.  Take antibiotics as prescribed.  Follow-up with hand surgeon, number provided below.  I have printed out your prescription for antibiotics but have also sent your antibiotic to [facility] pharmacy on [address].

## 2020-12-11 ENCOUNTER — Ambulatory Visit: Payer: Self-pay | Admitting: Physician Assistant

## 2020-12-11 ENCOUNTER — Other Ambulatory Visit: Payer: Self-pay

## 2020-12-11 VITALS — BP 123/68 | HR 67 | Temp 98.2°F | Resp 18 | Ht 67.0 in | Wt 278.0 lb

## 2020-12-11 DIAGNOSIS — Z791 Long term (current) use of non-steroidal anti-inflammatories (NSAID): Secondary | ICD-10-CM

## 2020-12-11 DIAGNOSIS — Z6841 Body Mass Index (BMI) 40.0 and over, adult: Secondary | ICD-10-CM

## 2020-12-11 DIAGNOSIS — K219 Gastro-esophageal reflux disease without esophagitis: Secondary | ICD-10-CM

## 2020-12-11 DIAGNOSIS — J302 Other seasonal allergic rhinitis: Secondary | ICD-10-CM

## 2020-12-11 MED ORDER — FLUTICASONE PROPIONATE 50 MCG/ACT NA SUSP
1.0000 | Freq: Every day | NASAL | 2 refills | Status: DC
  Filled 2020-12-11: qty 16, 30d supply, fill #0

## 2020-12-11 MED ORDER — PANTOPRAZOLE SODIUM 40 MG PO TBEC
40.0000 mg | DELAYED_RELEASE_TABLET | Freq: Every day | ORAL | 3 refills | Status: DC
  Filled 2020-12-11: qty 30, 30d supply, fill #0

## 2020-12-11 MED ORDER — CETIRIZINE HCL 10 MG PO TABS
10.0000 mg | ORAL_TABLET | Freq: Every day | ORAL | 0 refills | Status: DC
  Filled 2020-12-11: qty 30, 30d supply, fill #0

## 2020-12-11 MED ORDER — CLINDAMYCIN HCL 300 MG PO CAPS
300.0000 mg | ORAL_CAPSULE | Freq: Four times a day (QID) | ORAL | 0 refills | Status: AC
  Filled 2020-12-11: qty 40, 10d supply, fill #0

## 2020-12-11 NOTE — Patient Instructions (Signed)
You are going to start taking Protonix on a daily basis, I strongly encourage you to discontinue the use of Goody powders, also do not use ibuprofen, naproxen, Motrin, Aleve.  To help with your allergy headaches, you are going to take Zyrtec on a daily basis, you can also use Flonase as needed, if these do not offer relief, I encourage you to use Tylenol.  [facility], please be fasting for that appointment so we can complete all of your fasting labs at that time.  We will call you with today's lab results  Roney Jaffe, PA-C Physician Assistant [facility] [email or web address]   Gastroesophageal Reflux Disease, Adult Gastroesophageal reflux (GER) happens when acid from the stomach flows up into the tube that connects the mouth and the stomach (esophagus). Normally, food travels down the esophagus and stays in the stomach to be digested. However, when a person has GER, food and stomach acid sometimes move back up into the esophagus. If this becomes a more serious problem, the person may be diagnosed with a disease called gastroesophageal reflux disease (GERD). GERD occurs when the reflux: Happens often. Causes frequent or severe symptoms. Causes problems such as damage to the esophagus. When stomach acid comes in contact with the esophagus, the acid may cause inflammation in the esophagus. Over time, GERD may create small holes (ulcers) in the lining of the esophagus. What are the causes? This condition is caused by a problem with the muscle between the esophagus and the stomach (lower esophageal sphincter, or LES). Normally, the LES muscle closes after food passes through the esophagus to the stomach. When the LES is weakened or abnormal, it does not close properly, and that allows food and stomach acid to go back up into the esophagus. The LES can be weakened by certain  dietary substances, medicines, and medical conditions, including: Tobacco use. Pregnancy. Having a hiatal hernia. Alcohol use. Certain foods and beverages, such as coffee, chocolate, onions, and peppermint. What increases the risk? You are more likely to develop this condition if you: Have an increased body weight. Have a connective tissue disorder. Take NSAIDs, such as ibuprofen. What are the signs or symptoms? Symptoms of this condition include: Heartburn. Difficult or painful swallowing and the feeling of having a lump in the throat. A bitter taste in the mouth. Bad breath and having a large amount of saliva. Having an upset or bloated stomach and belching. Chest pain. Different conditions can cause chest pain. Make sure you see your health care provider if you experience chest pain. Shortness of breath or wheezing. Ongoing (chronic) cough or a nighttime cough. Wearing away of tooth enamel. Weight loss. How is this diagnosed? This condition may be diagnosed based on a medical history and a physical exam. To determine if you have mild or severe GERD, your health care provider may also monitor how you respond to treatment. You may also have tests, including: A test to examine your stomach and esophagus with a small camera (endoscopy). A test that measures the acidity level in your esophagus. A test that measures how much pressure is on your esophagus. A barium swallow or modified barium swallow test to show the shape, size, and functioning of your esophagus. How is this treated? Treatment for this condition may vary depending on how severe your symptoms are. Your health care provider may recommend: Changes to your diet. Medicine. Surgery. The goal of treatment is  to help relieve your symptoms and to prevent complications. Follow these instructions at home: Eating and drinking  Follow a diet as recommended by your health care provider. This may involve avoiding foods and drinks  such as: Coffee and tea, with or without caffeine. Drinks that contain alcohol. Energy drinks and sports drinks. Carbonated drinks or sodas. Chocolate and cocoa. Peppermint and mint flavorings. Garlic and onions. Horseradish. Spicy and acidic foods, including peppers, chili powder, curry powder, vinegar, hot sauces, and barbecue sauce. Citrus fruit juices and citrus fruits, such as oranges, lemons, and limes. Tomato-based foods, such as red sauce, chili, salsa, and pizza with red sauce. Fried and fatty foods, such as donuts, french fries, potato chips, and high-fat dressings. High-fat meats, such as hot dogs and fatty cuts of red and white meats, such as rib eye steak, sausage, ham, and bacon. High-fat dairy items, such as whole milk, butter, and cream cheese. Eat small, frequent meals instead of large meals. Avoid drinking large amounts of liquid with your meals. Avoid eating meals during the 2-3 hours before bedtime. Avoid lying down right after you eat. Do not exercise right after you eat. Lifestyle  Do not use any products that contain nicotine or tobacco. These products include cigarettes, chewing tobacco, and vaping devices, such as e-cigarettes. If you need help quitting, ask your health care provider. Try to reduce your stress by using methods such as yoga or meditation. If you need help reducing stress, ask your health care provider. If you are overweight, reduce your weight to an amount that is healthy for you. Ask your health care provider for guidance about a safe weight loss goal. General instructions Pay attention to any changes in your symptoms. Take over-the-counter and prescription medicines only as told by your health care provider. Do not take aspirin, ibuprofen, or other NSAIDs unless your health care provider told you to take these medicines. Wear loose-fitting clothing. Do not wear anything tight around your waist that causes pressure on your abdomen. Raise  (elevate) the head of your bed about 6 inches (15 cm). You can use a wedge to do this. Avoid bending over if this makes your symptoms worse. Keep all follow-up visits. This is important. Contact a health care provider if: You have: New symptoms. Unexplained weight loss. Difficulty swallowing or it hurts to swallow. Wheezing or a persistent cough. A hoarse voice. Your symptoms do not improve with treatment. Get help right away if: You have sudden pain in your arms, neck, jaw, teeth, or back. You suddenly feel sweaty, dizzy, or light-headed. You have chest pain or shortness of breath. You vomit and the vomit is green, yellow, or black, or it looks like blood or coffee grounds. You faint. You have stool that is red, bloody, or black. You cannot swallow, drink, or eat. These symptoms may represent a serious problem that is an emergency. Do not wait to see if the symptoms will go away. Get medical help right away. Call your local emergency services (911 in the [place] Do not drive yourself to the hospital. Summary Gastroesophageal reflux happens when acid from the stomach flows up into the esophagus. GERD is a disease in which the reflux happens often, causes frequent or severe symptoms, or causes problems such as damage to the esophagus. Treatment for this condition may vary depending on how severe your symptoms are. Your health care provider may recommend diet and lifestyle changes, medicine, or surgery. Contact a health care provider if you have new or worsening  symptoms. Take over-the-counter and prescription medicines only as told by your health care provider. Do not take aspirin, ibuprofen, or other NSAIDs unless your health care provider told you to do so. Keep all follow-up visits as told by your health care provider. This is important. This information is not intended to replace advice given to you by your health care provider. Make sure you discuss any questions you have with your  health care provider. Document Revised: 09/13/2019 Document Reviewed: 09/13/2019 Elsevier Patient Education  2022 [employer].

## 2020-12-11 NOTE — Progress Notes (Signed)
New Patient Office Visit  Subjective:  Patient ID: Kimberly Contreras, female    DOB: 1985/03/20  Age: 35 y.o. MRN: [id number]  CC:  Chief Complaint  Patient presents with   Heartburn    HPI Kimberly Contreras reports that she has been having acid reflux on a daily basis despite taking 80 mg of omeprazole.  Does endorse that she uses 2 Goody's powders on a daily basis due to daily allergy headaches.  States that she does not take a daily allergy medication.  Does endorse epigastric discomfort.    Past Medical History:  Diagnosis Date   Abscess    Anxiety Dx 2013   Depression Dx 2013   Obesity     Past Surgical History:  Procedure Laterality Date   APPENDECTOMY  2013     Family History  Problem Relation Age of Onset   Healthy Mother    Healthy Father    Cancer Other        breast in [facility], great aunt    Hypertension Other    Diabetes Other     Social History   Socioeconomic History   Marital status: Single    Spouse name: Not on file   Number of children: 0   Years of education: 9    Highest education level: Not on file  Occupational History   Occupation: Unemployed   Tobacco Use   Smoking status: Every Day    Packs/day: 0.50    Years: 11.00    Pack years: 5.50    Types: Cigarettes   Smokeless tobacco: Never  Vaping Use   Vaping Use: Never used  Substance and Sexual Activity   Alcohol use: No    Alcohol/week: 2.0 standard drinks    Types: 2 Shots of liquor per week    Comment: occ   Drug use: No   Sexual activity: Yes    Birth control/protection: None  Other Topics Concern   Not on file  Social History Narrative   Homeless.   Not in a shelter.   Working on [employer]   At [employer] since 2013 for depression and anxiety.    Social Determinants of Health   Financial Resource Strain: Not on file  Food Insecurity: Not on file  Transportation Needs: Not on file  Physical Activity: Not on file  Stress: Not on file  Social Connections: Not on file  Intimate  Partner Violence: Not on file    ROS Review of Systems  Constitutional:  Negative for chills and fever.  HENT: Negative.    Eyes: Negative.   Respiratory:  Negative for shortness of breath.   Cardiovascular:  Negative for chest pain.  Gastrointestinal:  Positive for abdominal pain. Negative for nausea and vomiting.  Endocrine: Negative.   Genitourinary: Negative.   Musculoskeletal: Negative.   Skin: Negative.   Allergic/Immunologic: Positive for environmental allergies.  Neurological:  Positive for headaches. Negative for dizziness and weakness.  Hematological: Negative.   Psychiatric/Behavioral: Negative.     Objective:   Today's Vitals: BP 123/68 (BP Location: Left Arm, Patient Position: Sitting, Cuff Size: Large)   Pulse 67   Temp 98.2 F (36.8 C) (Oral)   Resp 18   Ht 5\' 7"  (1.702 m)   Wt 278 lb (126.1 kg)   LMP 12/11/2020   SpO2 98%   BMI 43.54 kg/m   Physical Exam Vitals and nursing note reviewed.  Constitutional:      Appearance: Normal appearance. She is obese.  HENT:  Head: Normocephalic and atraumatic.     Right Ear: External ear normal.     Left Ear: External ear normal.     Nose: Nose normal.     Mouth/Throat:     Mouth: Mucous membranes are moist.     Pharynx: Oropharynx is clear.  Eyes:     Extraocular Movements: Extraocular movements intact.     Conjunctiva/sclera: Conjunctivae normal.     Pupils: Pupils are equal, round, and reactive to light.  Cardiovascular:     Rate and Rhythm: Normal rate and regular rhythm.     Pulses: Normal pulses.     Heart sounds: Normal heart sounds.  Pulmonary:     Effort: Pulmonary effort is normal.     Breath sounds: Normal breath sounds.  Abdominal:     Tenderness: There is abdominal tenderness in the epigastric area.  Musculoskeletal:        General: Normal range of motion.     Cervical back: Normal range of motion and neck supple.  Skin:    General: Skin is warm and dry.  Neurological:     General: No  focal deficit present.     Mental Status: She is alert and oriented to person, place, and time.  Psychiatric:        Mood and Affect: Mood normal.        Behavior: Behavior normal.        Thought Content: Thought content normal.        Judgment: Judgment normal.    Assessment & Plan:   Problem List Items Addressed This Visit       Digestive   Gastroesophageal reflux disease without esophagitis - Primary   Relevant Medications   pantoprazole (PROTONIX) 40 MG tablet   Other Relevant Orders   H Pylori, IGM, IGG, IGA AB (Completed)     Other   Seasonal allergies   Relevant Medications   cetirizine (ZYRTEC) 10 MG tablet   fluticasone (FLONASE) 50 MCG/ACT nasal spray   NSAID long-term use   Other Visit Diagnoses     Class 3 severe obesity due to excess calories without serious comorbidity with body mass index (BMI) of 40.0 to 44.9 in adult [facility])           Outpatient Encounter Medications as of 12/11/2020  Medication Sig   pantoprazole (PROTONIX) 40 MG tablet Take 1 tablet (40 mg total) by mouth daily.   Aspirin-Salicylamide-Caffeine (BC HEADACHE PO) Take 1 packet by mouth daily as needed (pain). (Patient not taking: Reported on 12/11/2020)   cetirizine (ZYRTEC) 10 MG tablet Take 1 tablet (10 mg total) by mouth daily.   diclofenac Sodium (VOLTAREN) 1 % GEL Apply 4 g topically 4 (four) times daily. (Patient not taking: No sig reported)   fluticasone (FLONASE) 50 MCG/ACT nasal spray Place 1 spray into both nostrils daily.   naproxen (NAPROSYN) 500 MG tablet Take 1 tablet (500 mg total) by mouth 2 (two) times daily. (Patient not taking: No sig reported)   [DISCONTINUED] albuterol (PROVENTIL HFA;VENTOLIN HFA) 108 (90 Base) MCG/ACT inhaler Inhale 1-2 puffs into the lungs every 6 (six) hours as needed for wheezing or shortness of breath. (Patient not taking: No sig reported)   [DISCONTINUED] cetirizine (ZYRTEC) 10 MG tablet Take 1 tablet (10 mg total) by mouth daily. (Patient not taking:  No sig reported)   [DISCONTINUED] fluticasone (FLONASE) 50 MCG/ACT nasal spray Place 1 spray into both nostrils daily. (Patient not taking: No sig reported)   [DISCONTINUED] HYDROcodone-acetaminophen (NORCO/VICODIN)  5-325 MG tablet Take 1 tablet by mouth every 6 (six) hours as needed. (Patient not taking: Reported on 12/11/2020)   No facility-administered encounter medications on file as of 12/11/2020.   1. Gastroesophageal reflux disease without esophagitis Trial Protonix.  Patient education given on avoiding NSAIDs, patient education given on other lifestyle modifications.  Red flags given for prompt reevaluation  Patient to follow-up with this provider in 2 weeks, fasting labs to be completed at that office visit. - pantoprazole (PROTONIX) 40 MG tablet; Take 1 tablet (40 mg total) by mouth daily.  Dispense: 30 tablet; Refill: 3 - H Pylori, IGM, IGG, IGA AB  2. Seasonal allergies Trial Zyrtec, Flonase. - cetirizine (ZYRTEC) 10 MG tablet; Take 1 tablet (10 mg total) by mouth daily.  Dispense: 30 tablet; Refill: 0 - fluticasone (FLONASE) 50 MCG/ACT nasal spray; Place 1 spray into both nostrils daily.  Dispense: 16 g; Refill: 2  3. NSAID long-term use   I have reviewed the patient's medical history (PMH, PSH, Social History, Family History, Medications, and allergies) , and have been updated if relevant. I spent 30 minutes reviewing chart and  face to face time with patient.    Follow-up: Return in about 15 days (around 12/26/2020) for [facility].   Kasandra Knudsen Mayers, PA-C

## 2020-12-11 NOTE — Progress Notes (Signed)
Patient reports not picking up clindamycin due to lack of funds, patients antibiotic has been transferred to [facility] for pick up. Patient reports taking 80 mg of omeprazole that was prescribed and given by the RN at the [facility]. Patient is requesting a refill for this medication due to acid causing a sore throat. Patient complains of seasonal allergies with headaches due to no daily medication.

## 2020-12-13 ENCOUNTER — Telehealth: Payer: Self-pay | Admitting: *Deleted

## 2020-12-13 LAB — H PYLORI, IGM, IGG, IGA AB
H pylori, IgM Abs: <9 units (ref 0.0–8.9)
H. pylori, IgA Abs: <9 units (ref 0.0–8.9)
H. pylori, IgG AbS: 0.35 Index Value (ref 0.00–0.79)

## 2020-12-13 NOTE — Telephone Encounter (Signed)
-----   Message from Roney Jaffe, [place] sent at 12/13/2020 11:18 AM EDT ----- Please call patient and let her know that she was negative for H. pylori infection.  Please encourage her to keep follow-up visit

## 2020-12-13 NOTE — Telephone Encounter (Signed)
MA UTR patient on mobile/ home contacts. Patients listed address is the [facility]. Patient should keep FU appointment. Results were negative for bacteria of the stomach.

## 2020-12-14 ENCOUNTER — Telehealth: Payer: Self-pay

## 2020-12-14 NOTE — Telephone Encounter (Signed)
Copied from CRM [id number]. Topic: General - Other >> Dec 14, 2020  1:32 PM Kimberly Contreras wrote: Reason for CRM: Pt called in to speak about her Labs results and wanted to speak with [place], please advise.

## 2020-12-14 NOTE — Telephone Encounter (Signed)
MA attempted patient 2 more times with no success and no VM option.

## 2020-12-26 ENCOUNTER — Ambulatory Visit: Payer: No Typology Code available for payment source | Admitting: Physician Assistant

## 2021-02-15 ENCOUNTER — Ambulatory Visit: Payer: No Typology Code available for payment source | Admitting: Physician Assistant

## 2021-10-01 ENCOUNTER — Encounter: Payer: Self-pay | Admitting: *Deleted

## 2021-10-01 NOTE — Congregational Nurse Program (Signed)
  Dept: [phone]   Congregational Nurse Program Note  Date of Encounter: 10/01/2021  Past Medical History: Past Medical History:  Diagnosis Date   Abscess    Anxiety Dx 2013   Depression Dx 2013   Obesity     Encounter Details:  CNP Questionnaire - 10/01/21 1059       Questionnaire   Do you give verbal consent to treat you today? Yes    Location Patient Served  [facility]    Visit Setting Church or Organization    Patient Status Unknown    Engineer, building services or [employer] Referral N/A    Medication N/A    Medical Provider No    Screening Referrals N/A    Medical Referral Other   [facility]   Medical Appointment Made N/A    Food N/A    Transportation Need transportation assistance;Provided transportation assistance    Housing/Utilities N/A    Interpersonal Safety N/A    Intervention Support    ED Visit Averted N/A    Life-Saving Intervention Made N/A            Client seen sitting outside [facility] requesting help getting some muscle rub for her rt knee. Explained that Clinical research associate does not keep medication in office. She requested help with transportation to the drug store and says she has money for medication. Client reports she has Friday [employer] and no PCP. Offered to assist client with setting up a PCP. At this time, client wants help with transportation. Set up transportation to [facility] drug store. Gave card to call for return ride and a bus pass in case client had difficulty getting return ride. Larsen Zettel W RN CN

## 2021-10-12 ENCOUNTER — Emergency Department (HOSPITAL_COMMUNITY): Payer: Self-pay

## 2021-10-12 ENCOUNTER — Emergency Department (HOSPITAL_COMMUNITY)
Admission: EM | Admit: 2021-10-12 | Discharge: 2021-10-12 | Discharge status: Against Medical Advice | Payer: Self-pay | Attending: Emergency Medicine | Admitting: Emergency Medicine

## 2021-10-12 DIAGNOSIS — M25561 Pain in right knee: Secondary | ICD-10-CM | POA: Insufficient documentation

## 2021-10-12 DIAGNOSIS — Z5321 Procedure and treatment not carried out due to patient leaving prior to being seen by health care provider: Secondary | ICD-10-CM | POA: Insufficient documentation

## 2021-10-12 DIAGNOSIS — R300 Dysuria: Secondary | ICD-10-CM | POA: Insufficient documentation

## 2021-10-12 DIAGNOSIS — L292 Pruritus vulvae: Secondary | ICD-10-CM | POA: Insufficient documentation

## 2021-10-12 LAB — URINALYSIS, ROUTINE W REFLEX MICROSCOPIC
Bacteria, UA: NONE SEEN
Bilirubin Urine: NEGATIVE
Glucose, UA: NEGATIVE mg/dL
Hgb urine dipstick: NEGATIVE
Ketones, ur: NEGATIVE mg/dL
Nitrite: NEGATIVE
Protein, ur: NEGATIVE mg/dL
Specific Gravity, Urine: 1.017 (ref 1.005–1.030)
pH: 5 (ref 5.0–8.0)

## 2021-10-12 LAB — PREGNANCY, URINE: Preg Test, Ur: NEGATIVE

## 2021-10-12 NOTE — ED Notes (Signed)
Pt decided to leave and go somewhere else

## 2021-10-12 NOTE — ED Notes (Signed)
PT WOULD NOT WAKE UP FOR VITALS TO BE TAKEN

## 2021-10-12 NOTE — ED Triage Notes (Signed)
Pt c/o "hotness, burning" in vaginal area, burning w urination, concern for STD exposure, advises a dark brown discharge. Also advises that she feels she has a dislocated R knee, advises hx same.

## 2021-10-14 LAB — URINE CULTURE: Culture: 60000 — AB

## 2021-10-15 ENCOUNTER — Telehealth: Payer: Self-pay

## 2021-10-15 NOTE — Progress Notes (Signed)
ED Antimicrobial Stewardship Positive Culture Follow Up   Kimberly Contreras is an 36 y.o. female who presented to [facility] on 10/12/2021 with a chief complaint of  Chief Complaint  Patient presents with   Vaginal Itching   Knee Pain    Recent Results (from the past 720 hour(s))  Urine Culture     Status: Abnormal   Collection Time: 10/12/21  1:41 AM   Specimen: Urine, Clean Catch  Result Value Ref Range Status   Specimen Description URINE, CLEAN CATCH  Final   Special Requests   Final    NONE Performed at [facility], [address] [place], [place] [address]    Culture 60,000 COLONIES/mL ESCHERICHIA COLI (A)  Final   Report Status 10/14/2021 FINAL  Final   Organism ID, Bacteria ESCHERICHIA COLI (A)  Final      Susceptibility   Escherichia coli - MIC*    AMPICILLIN 4 SENSITIVE Sensitive     CEFAZOLIN <=4 SENSITIVE Sensitive     CEFEPIME <=0.12 SENSITIVE Sensitive     CEFTRIAXONE <=0.25 SENSITIVE Sensitive     CIPROFLOXACIN <=0.25 SENSITIVE Sensitive     GENTAMICIN <=1 SENSITIVE Sensitive     IMIPENEM <=0.25 SENSITIVE Sensitive     NITROFURANTOIN <=16 SENSITIVE Sensitive     TRIMETH/SULFA <=20 SENSITIVE Sensitive     AMPICILLIN/SULBACTAM <=2 SENSITIVE Sensitive     PIP/TAZO <=4 SENSITIVE Sensitive     * 60,000 COLONIES/mL ESCHERICHIA COLI    []  Treated with N/A, organism resistant to prescribed antimicrobial [x]  Patient discharged originally without antimicrobial agent and treatment is now indicated  New antibiotic prescription: amoxicillin 875mg  PO BID x 5 days  ED Provider: , PA-C   [name], 10/15/2021, 7:47 AM Clinical Pharmacist Monday - Friday phone -  [phone] Saturday - Sunday phone - [phone]

## 2021-10-15 NOTE — Telephone Encounter (Signed)
Post ED Visit - Positive Culture Follow-up: Unsuccessful Patient Follow-up  Culture assessed and recommendations reviewed by:  []  , Pharm.D. []  Enzo Bi, Pharm.D., BCPS AQ-ID []  , Pharm.D., BCPS []  Celedonio Miyamoto, Pharm.D., BCPS []  Cowley, Garvin Fila.D., BCPS, AAHIVP []  , Pharm.D., BCPS, AAHIVP []  Georgina Pillion, PharmD []  , PharmD, BCPS  Positive urine culture  [x]  Patient discharged without antimicrobial prescription and treatment is now indicated []  Organism is resistant to prescribed ED discharge antimicrobial []  Patient with positive blood cultures  Plan: Contact pt and start Amoxicillin 875 mg po BID x 5 days per ED provider [place], PA-C  Unable to contact patient after 3 attempts, letter will be sent to address on file  [address] 10/15/2021, 3:02 PM

## 2021-10-23 ENCOUNTER — Other Ambulatory Visit: Payer: Self-pay

## 2021-10-23 ENCOUNTER — Telehealth (HOSPITAL_BASED_OUTPATIENT_CLINIC_OR_DEPARTMENT_OTHER): Payer: Self-pay | Admitting: Emergency Medicine

## 2021-10-23 MED ORDER — AMOXICILLIN 875 MG PO TABS
ORAL_TABLET | ORAL | 0 refills | Status: DC
  Filled 2021-10-23: qty 10, 5d supply, fill #0

## 2021-12-04 ENCOUNTER — Encounter (HOSPITAL_COMMUNITY): Payer: Self-pay | Admitting: Emergency Medicine

## 2021-12-04 ENCOUNTER — Other Ambulatory Visit: Payer: Self-pay

## 2021-12-04 ENCOUNTER — Ambulatory Visit (HOSPITAL_COMMUNITY)
Admission: EM | Admit: 2021-12-04 | Discharge: 2021-12-04 | Disposition: A | Discharge status: Home / Self Care | Payer: No Typology Code available for payment source | Attending: Family Medicine | Admitting: Family Medicine

## 2021-12-04 DIAGNOSIS — R300 Dysuria: Secondary | ICD-10-CM

## 2021-12-04 DIAGNOSIS — N898 Other specified noninflammatory disorders of vagina: Secondary | ICD-10-CM

## 2021-12-04 LAB — POCT URINALYSIS DIPSTICK, ED / UC
Bilirubin Urine: NEGATIVE
Glucose, UA: NEGATIVE mg/dL
Hgb urine dipstick: NEGATIVE
Ketones, ur: NEGATIVE mg/dL
Leukocytes,Ua: NEGATIVE
Nitrite: NEGATIVE
Protein, ur: NEGATIVE mg/dL
Specific Gravity, Urine: 1.025 (ref 1.005–1.030)
Urobilinogen, UA: 0.2 mg/dL (ref 0.0–1.0)
pH: 5.5 (ref 5.0–8.0)

## 2021-12-04 LAB — POC URINE PREG, ED: Preg Test, Ur: NEGATIVE

## 2021-12-04 MED ORDER — FLUCONAZOLE 150 MG PO TABS: ORAL_TABLET | ORAL | 0 refills | Status: DC

## 2021-12-04 NOTE — ED Triage Notes (Signed)
Reports reoccurring symptoms since last ED visit.  States she took medicine, but burning with urination and vaginal discharge continues

## 2021-12-05 LAB — CERVICOVAGINAL ANCILLARY ONLY
Bacterial Vaginitis (gardnerella): NEGATIVE
Candida Glabrata: NEGATIVE
Candida Vaginitis: NEGATIVE
Chlamydia: NEGATIVE
Comment: NEGATIVE
Comment: NEGATIVE
Comment: NEGATIVE
Comment: NEGATIVE
Comment: NEGATIVE
Comment: NORMAL
Neisseria Gonorrhea: NEGATIVE
Trichomonas: NEGATIVE

## 2021-12-05 NOTE — ED Provider Notes (Signed)
[facility]    ASSESSMENT & PLAN:  1. Dysuria   2. Vaginal discharge   3. Vaginal itching    Meds ordered this encounter  Medications   fluconazole (DIFLUCAN) 150 MG tablet    Sig: Take one tablet by mouth as a single dose. May repeat in 3 days if symptoms persist.    Dispense:  2 tablet    Refill:  0   No signs of pyelonephritis. UPT negative. Urine culture sent. Vaginal cytology sent. Will follow up with her PCP or here if not showing improvement over the next 48 hours, sooner if needed.  Outlined signs and symptoms indicating need for more acute intervention. Patient verbalized understanding. After Visit Summary given.  SUBJECTIVE:  Kimberly Contreras is a 36 y.o. female who complains of urinary frequency, urgency and dysuria for the past few days. Without associated flank pain, fever, chills,  or bleeding. Gross hematuria: not present. With vaginal d/c. Desires STI testing. No specific aggravating or alleviating factors reported. No LE edema. Normal PO intake without n/v/d. Without specific abdominal pain. Ambulatory without difficulty. OTC treatment: none. H/O UTI: infrequent.  LMP: Patient's last menstrual period was 11/30/2021 (approximate).   OBJECTIVE:  Vitals:   12/04/21 1735  BP: 116/78  Pulse: 61  Resp: 18  Temp: 98.4 F (36.9 C)  TempSrc: Oral  SpO2: 98%   General appearance: alert; no distress HENT: oropharynx: NAD Lungs: unlabored respirations Abdomen: soft, non-tender; bowel sounds normal; no masses or organomegaly; no guarding or rebound tenderness Back: no CVA tenderness Extremities: no edema; symmetrical with no gross deformities Skin: warm and dry Neurologic: normal gait Psychological: alert and cooperative; normal mood and affect  Labs Reviewed  POCT URINALYSIS DIPSTICK, ED / UC  POC URINE PREG, ED  CERVICOVAGINAL ANCILLARY ONLY    No Known Allergies  Past Medical History:  Diagnosis Date   Abscess    Anxiety Dx 2013    Depression Dx 2013   Obesity    Social History   Socioeconomic History   Marital status: Single    Spouse name: Not on file   Number of children: 0   Years of education: 9    Highest education level: Not on file  Occupational History   Occupation: Unemployed   Tobacco Use   Smoking status: Every Day    Packs/day: 0.50    Years: 11.00    Total pack years: 5.50    Types: Cigarettes   Smokeless tobacco: Never  Vaping Use   Vaping Use: Never used  Substance and Sexual Activity   Alcohol use: Yes    Alcohol/week: 2.0 standard drinks of alcohol    Types: 2 Shots of liquor per week    Comment: occ   Drug use: No   Sexual activity: Yes    Birth control/protection: None  Other Topics Concern   Not on file  Social History Narrative   Homeless.   Not in a shelter.   Working on [employer]   At [employer] since 2013 for depression and anxiety.    Social Determinants of Health   Financial Resource Strain: Not on file  Food Insecurity: Not on file  Transportation Needs: Not on file  Physical Activity: Not on file  Stress: Not on file  Social Connections: Not on file  Intimate Partner Violence: Not on file   Family History  Problem Relation Age of Onset   Healthy Mother    Healthy Father    Cancer Other  breast in [facility], great aunt    Hypertension Other    Diabetes Other         Vanessa Kick, MD 12/05/21 [phone]

## 2022-02-12 ENCOUNTER — Ambulatory Visit: Payer: No Typology Code available for payment source | Admitting: Dermatology

## 2022-02-22 ENCOUNTER — Other Ambulatory Visit: Payer: Self-pay

## 2022-02-22 ENCOUNTER — Emergency Department (HOSPITAL_COMMUNITY): Payer: Commercial Managed Care - HMO

## 2022-02-22 ENCOUNTER — Emergency Department (HOSPITAL_COMMUNITY)
Admission: EM | Admit: 2022-02-22 | Discharge: 2022-02-23 | Disposition: A | Discharge status: Home / Self Care | Payer: Commercial Managed Care - HMO | Attending: Emergency Medicine | Admitting: Emergency Medicine

## 2022-02-22 DIAGNOSIS — K2100 Gastro-esophageal reflux disease with esophagitis, without bleeding: Secondary | ICD-10-CM | POA: Diagnosis not present

## 2022-02-22 DIAGNOSIS — Z7982 Long term (current) use of aspirin: Secondary | ICD-10-CM | POA: Insufficient documentation

## 2022-02-22 DIAGNOSIS — M542 Cervicalgia: Secondary | ICD-10-CM | POA: Diagnosis present
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
## 2022-02-23 MED ORDER — FAMOTIDINE 20 MG PO TABS
20.0000 mg | ORAL_TABLET | Freq: Two times a day (BID) | ORAL | 0 refills | Status: DC
  Filled 2022-02-23: qty 30, 15d supply, fill #0

## 2022-02-23 MED ORDER — SIMETHICONE 80 MG PO CHEW
80.0000 mg | CHEWABLE_TABLET | Freq: Four times a day (QID) | ORAL | 0 refills | Status: DC | PRN
  Filled 2022-02-23: qty 30, 8d supply, fill #0
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
## 2022-02-25 ENCOUNTER — Encounter: Payer: Self-pay | Admitting: *Deleted

## 2022-02-25 ENCOUNTER — Other Ambulatory Visit: Payer: Self-pay
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
## 2022-02-26 ENCOUNTER — Inpatient Hospital Stay: Payer: No Typology Code available for payment source | Admitting: Internal Medicine

## 2022-02-26 ENCOUNTER — Other Ambulatory Visit: Payer: Self-pay

## 2022-03-04 ENCOUNTER — Other Ambulatory Visit: Payer: Self-pay

## 2022-04-04 ENCOUNTER — Ambulatory Visit: Payer: Self-pay | Attending: Internal Medicine | Admitting: Internal Medicine

## 2022-07-20 ENCOUNTER — Other Ambulatory Visit: Payer: Self-pay

## 2022-07-20 ENCOUNTER — Encounter (HOSPITAL_COMMUNITY): Payer: Self-pay

## 2022-07-20 ENCOUNTER — Emergency Department (HOSPITAL_COMMUNITY): Payer: Self-pay

## 2022-07-20 ENCOUNTER — Emergency Department (HOSPITAL_COMMUNITY)
Admission: EM | Admit: 2022-07-20 | Discharge: 2022-07-20 | Disposition: A | Discharge status: Home / Self Care | Payer: Self-pay | Attending: Emergency Medicine | Admitting: Emergency Medicine

## 2022-07-20 DIAGNOSIS — S0081XA Abrasion of other part of head, initial encounter: Secondary | ICD-10-CM | POA: Insufficient documentation

## 2022-07-20 DIAGNOSIS — S0990XA Unspecified injury of head, initial encounter: Secondary | ICD-10-CM

## 2022-07-20 MED ORDER — HYDROCODONE-ACETAMINOPHEN 5-325 MG PO TABS
1.0000 | ORAL_TABLET | Freq: Once | ORAL | Status: AC
  Administered 2022-07-20: 1 via ORAL
  Filled 2022-07-20: qty 1
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
## 2022-07-20 NOTE — Discharge Instructions (Addendum)
As we discussed you wish to be discharged following the CT scan.  The CT scan showed a contusion of the left side of your head but no brain bleeds or fractures.  You may alternate between Tylenol and ibuprofen every 6 hours as needed for pain.  As we discussed you verbalized you did not want to do blood labs and so we will forego those during this visit.  If symptoms begin to worsen please return to ER.
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
## 2023-01-18 ENCOUNTER — Encounter (HOSPITAL_COMMUNITY): Payer: Self-pay

## 2023-01-18 ENCOUNTER — Ambulatory Visit (HOSPITAL_COMMUNITY)
Admission: EM | Admit: 2023-01-18 | Discharge: 2023-01-18 | Disposition: A | Discharge status: Home / Self Care | Payer: BLUE CROSS/BLUE SHIELD | Attending: Emergency Medicine | Admitting: Emergency Medicine

## 2023-01-18 DIAGNOSIS — N898 Other specified noninflammatory disorders of vagina: Secondary | ICD-10-CM | POA: Diagnosis present

## 2023-01-18 DIAGNOSIS — A64 Unspecified sexually transmitted disease: Secondary | ICD-10-CM | POA: Insufficient documentation

## 2023-01-18 DIAGNOSIS — R300 Dysuria: Secondary | ICD-10-CM | POA: Diagnosis not present

## 2023-01-18 DIAGNOSIS — Z8619 Personal history of other infectious and parasitic diseases: Secondary | ICD-10-CM | POA: Insufficient documentation

## 2023-01-18 DIAGNOSIS — A5901 Trichomonal vulvovaginitis: Secondary | ICD-10-CM | POA: Insufficient documentation

## 2023-01-18 LAB — URINALYSIS, W/ REFLEX TO CULTURE (INFECTION SUSPECTED)
Bacteria, UA: NONE SEEN
Bilirubin Urine: NEGATIVE
Glucose, UA: NEGATIVE mg/dL
Hgb urine dipstick: NEGATIVE
Ketones, ur: NEGATIVE mg/dL
Leukocytes,Ua: NEGATIVE
Nitrite: NEGATIVE
Protein, ur: NEGATIVE mg/dL
Specific Gravity, Urine: 1.002 — ABNORMAL LOW (ref 1.005–1.030)
pH: 5 (ref 5.0–8.0)

## 2023-01-18 LAB — HIV ANTIBODY (ROUTINE TESTING W REFLEX): HIV Screen 4th Generation wRfx: NONREACTIVE

## 2023-01-18 MED ORDER — LIDOCAINE HCL (PF) 1 % IJ SOLN
INTRAMUSCULAR | Status: AC
  Filled 2023-01-18: qty 2

## 2023-01-18 MED ORDER — CEFTRIAXONE SODIUM 1 G IJ SOLR
INTRAMUSCULAR | Status: AC
  Filled 2023-01-18: qty 10

## 2023-01-18 MED ORDER — CEFTRIAXONE SODIUM 1 G IJ SOLR
1.0000 g | INTRAMUSCULAR | Status: DC
  Administered 2023-01-18: 1 g via INTRAMUSCULAR

## 2023-01-18 MED ORDER — METRONIDAZOLE 500 MG PO TABS
500.0000 mg | ORAL_TABLET | Freq: Two times a day (BID) | ORAL | 0 refills | Status: DC
  Filled 2023-01-18 – 2023-01-20 (×2): qty 14, 7d supply, fill #0
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
## 2023-01-18 NOTE — ED Triage Notes (Signed)
Patient reports that she has been having "moderate vaginal itching", dysuria, and a brown vaginal discharge with a "fishy odor."

## 2023-01-18 NOTE — ED Provider Notes (Signed)
[facility]    CSN: [id number] Arrival date & time: 01/18/23  1617      History   Chief Complaint No chief complaint on file.   HPI Kimberly Contreras is a 37 y.o. female.   Patient reporting greenish frothy discharge with vaginal burning.  Symptoms have been going on for 1 week.  She is requesting testing for STDs.  She has had a history of an STD in the past about 8 years ago with similar symptoms but she does not remember what she was treated for at that time.  The history is provided by the patient.    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      Home Medications    Prior to Admission medications   Medication Sig Start Date End Date Taking? Authorizing Provider  metroNIDAZOLE (FLAGYL) 500 MG tablet Take 1 tablet (500 mg total) by mouth 2 (two) times daily. 01/18/23  Yes Chiamaka Latka, Linde Gillis, NP  albuterol (PROVENTIL HFA;VENTOLIN HFA) 108 (90 Base) MCG/ACT inhaler Inhale 1-2 puffs into the lungs every 6 (six) hours as needed for wheezing or shortness of breath. Patient not taking: No sig reported 04/28/17 01/07/19  Petrucelli, Pleas Koch, PA-C    Family History Family History  Problem Relation Age of Onset   Healthy Mother    Healthy Father    Cancer Other        breast in [facility], great aunt     Hypertension Other    Diabetes Other     Social History Social History   Tobacco Use   Smoking status: Every Day    Types: Cigars   Smokeless tobacco: Never  Vaping Use   Vaping status: Never Used  Substance Use Topics   Alcohol use: Yes    Alcohol/week: 2.0 standard drinks of alcohol    Types: 2 Shots of liquor per week    Comment: occ   Drug use: No     Allergies   Patient has no known allergies.   Review of Systems Review of Systems  Constitutional: Negative.   Gastrointestinal: Negative.   Genitourinary:  Positive for vaginal discharge.       Vaginal burning, itching.  Skin: Negative.   All other systems reviewed and are negative.    Physical Exam Triage Vital Signs ED Triage Vitals [01/18/23 1709]  Encounter Vitals Group     BP 130/77     Systolic BP Percentile      Diastolic BP Percentile      Pulse Rate 70     Resp 16     Temp 98 F (36.7 C)     Temp Source Oral     SpO2 98 %     Weight      Height  Head Circumference      Peak Flow      Pain Score 0     Pain Loc      Pain Education      Exclude from Growth Chart    No data found.  Updated Vital Signs BP 130/77 (BP Location: Right Arm)   Pulse 70   Temp 98 F (36.7 C) (Oral)   Resp 16   LMP 01/03/2023   SpO2 98%   Visual Acuity Right Eye Distance:   Left Eye Distance:   Bilateral Distance:    Right Eye Near:   Left Eye Near:    Bilateral Near:     Physical Exam Vitals and nursing note reviewed.  Abdominal:     Palpations: Abdomen is soft.  Genitourinary:    Vagina: Vaginal discharge present.  Neurological:     General: No focal deficit present.     Mental Status: She is alert.      UC Treatments / Results  Labs (all labs ordered are listed, but only abnormal results are displayed) Labs Reviewed  URINALYSIS, W/ REFLEX TO CULTURE (INFECTION SUSPECTED) - Abnormal; Notable for the following components:      Result Value   Color, Urine STRAW (*)    Specific  Gravity, Urine 1.002 (*)    All other components within normal limits  HIV ANTIBODY (ROUTINE TESTING W REFLEX)  RPR  CERVICOVAGINAL ANCILLARY ONLY  CYTOLOGY, (ORAL, ANAL, URETHRAL) ANCILLARY ONLY    EKG   Radiology No results found.  Procedures Procedures (including critical care time)  Medications Ordered in UC Medications - No data to display  Initial Impression / Assessment and Plan / UC Course  I have reviewed the triage vital signs and the nursing notes.  Pertinent labs & imaging results that were available during my care of the patient were reviewed by me and considered in my medical decision making (see chart for details).    Patient is having symptoms that are similar to previous symptoms she had when she was positive for STI. She does report some dysuria along with greenish frothy vaginal discharge. UA completed. Vaginal swab completed, we will call her with the final results. We will prophylactically treat her with Flagyl as symptoms are more consistent with STI versus UTI.  Education, reinforce safe, sex practices and notifying partners if they may be at risk. Final Clinical Impressions(s) / UC Diagnoses   Final diagnoses:  Sexually transmitted disease (STD)  Trichomoniasis of vagina     Discharge Instructions      Take Flagyl 1 tablet twice a day until complete. Safe sex practices to include wearing condoms. We will call you for any abnormal findings.     ED Prescriptions     Medication Sig Dispense Auth. Provider   metroNIDAZOLE (FLAGYL) 500 MG tablet Take 1 tablet (500 mg total) by mouth 2 (two) times daily. 14 tablet Bartolo Montanye, Linde Gillis, NP      PDMP not reviewed this encounter.   Nelda Marseille, NP 02/01/23 1123

## 2023-01-19 LAB — RPR: RPR Ser Ql: NONREACTIVE

## 2023-01-20 ENCOUNTER — Other Ambulatory Visit: Payer: Self-pay

## 2023-01-20 LAB — CERVICOVAGINAL ANCILLARY ONLY
Bacterial Vaginitis (gardnerella): NEGATIVE
Candida Glabrata: NEGATIVE
Candida Vaginitis: NEGATIVE
Chlamydia: NEGATIVE
Comment: NEGATIVE
Comment: NEGATIVE
Comment: NEGATIVE
Comment: NEGATIVE
Comment: NEGATIVE
Comment: NORMAL
Neisseria Gonorrhea: NEGATIVE
Trichomonas: NEGATIVE

## 2023-01-20 LAB — CYTOLOGY, (ORAL, ANAL, URETHRAL) ANCILLARY ONLY
Chlamydia: NEGATIVE
Comment: NEGATIVE
Comment: NEGATIVE
Comment: NORMAL
Neisseria Gonorrhea: NEGATIVE
Trichomonas: NEGATIVE

## 2023-01-23 ENCOUNTER — Ambulatory Visit (INDEPENDENT_AMBULATORY_CARE_PROVIDER_SITE_OTHER): Payer: BLUE CROSS/BLUE SHIELD | Admitting: Primary Care

## 2023-05-16 ENCOUNTER — Other Ambulatory Visit: Payer: Self-pay

## 2023-05-16 ENCOUNTER — Emergency Department (HOSPITAL_COMMUNITY)
Admission: EM | Admit: 2023-05-16 | Discharge: 2023-05-16 | Disposition: A | Discharge status: Home / Self Care | Payer: Self-pay | Attending: Emergency Medicine | Admitting: Emergency Medicine

## 2023-05-16 DIAGNOSIS — F17200 Nicotine dependence, unspecified, uncomplicated: Secondary | ICD-10-CM | POA: Insufficient documentation

## 2023-05-16 DIAGNOSIS — A64 Unspecified sexually transmitted disease: Secondary | ICD-10-CM | POA: Insufficient documentation

## 2023-05-16 LAB — WET PREP, GENITAL
Clue Cells Wet Prep HPF POC: NONE SEEN
Sperm: NONE SEEN
Trich, Wet Prep: NONE SEEN
WBC, Wet Prep HPF POC: <10 (ref <10)
Yeast Wet Prep HPF POC: NONE SEEN

## 2023-05-16 LAB — HIV ANTIBODY (ROUTINE TESTING W REFLEX): HIV Screen 4th Generation wRfx: NONREACTIVE

## 2023-05-16 LAB — URINALYSIS, ROUTINE W REFLEX MICROSCOPIC
Bilirubin Urine: NEGATIVE
Glucose, UA: NEGATIVE mg/dL
Hgb urine dipstick: NEGATIVE
Ketones, ur: NEGATIVE mg/dL
Leukocytes,Ua: NEGATIVE
Nitrite: NEGATIVE
Protein, ur: NEGATIVE mg/dL
Specific Gravity, Urine: 1.033 — ABNORMAL HIGH (ref 1.005–1.030)
pH: 5 (ref 5.0–8.0)

## 2023-05-16 LAB — RPR: RPR Ser Ql: NONREACTIVE

## 2023-05-16 LAB — PREGNANCY, URINE: Preg Test, Ur: NEGATIVE

## 2023-05-16 MED ORDER — DOXYCYCLINE HYCLATE 100 MG PO CAPS: 100.0000 mg | ORAL_CAPSULE | Freq: Two times a day (BID) | ORAL | 0 refills | Status: AC

## 2023-05-16 MED ORDER — DOXYCYCLINE HYCLATE 100 MG PO TABS
100.0000 mg | ORAL_TABLET | Freq: Once | ORAL | Status: AC
  Administered 2023-05-16: 100 mg via ORAL
  Filled 2023-05-16: qty 1

## 2023-05-16 MED ORDER — CEFTRIAXONE SODIUM 500 MG IJ SOLR
500.0000 mg | Freq: Once | INTRAMUSCULAR | Status: AC
  Administered 2023-05-16: 500 mg via INTRAMUSCULAR
  Filled 2023-05-16: qty 500

## 2023-05-16 MED ORDER — LIDOCAINE HCL (PF) 1 % IJ SOLN
1.0000 mL | Freq: Once | INTRAMUSCULAR | Status: AC
  Administered 2023-05-16: 1 mL
  Filled 2023-05-16: qty 5

## 2023-05-16 NOTE — Discharge Instructions (Addendum)
 As discussed, we are treating you for a suspected STD, since the gonorrhea and chlamydia testing takes several days to result. You are negative for UTI, trichomonas, BV, or yeast infection.  Take Doxycyline twice a day for the next 7 days.   Take this medication with food as it can cause nausea and vomiting taking on empty stomach.  Have your partner get tested as well.  Do not have sexual activity until completion of antibiotics.  Return to the ED if you have new or worsening symptoms despite antibiotic use.

## 2023-05-16 NOTE — ED Provider Notes (Signed)
 [place] EMERGENCY DEPARTMENT AT [facility] Provider Note   CSN: [id number] Arrival date & time: 05/16/23  0157     History  Chief Complaint  Patient presents with   STD screening    Kimberly Contreras is a 38 y.o. female with a history of tobacco use, anxiety, and depression who presents the ED today for STD testing.  Patient reports yellow malodorous vaginal discharge for the past 2 weeks with associated itching and burning sensation.  States that her boyfriend has similar symptoms.  Reports only 1 sexual partner and they do not use condoms.  Denies vaginal bleeding, hematuria, dysuria, dyspareunia, abdominal pain, or back pain.  No additional complaints or concerns at this time.    Home Medications Prior to Admission medications   Medication Sig Start Date End Date Taking? Authorizing Provider  doxycycline (VIBRAMYCIN) 100 MG capsule Take 1 capsule (100 mg total) by mouth 2 (two) times daily for 7 days. 05/16/23 05/23/23 Yes Maxwell Marion, PA-C  metroNIDAZOLE (FLAGYL) 500 MG tablet Take 1 tablet (500 mg total) by mouth 2 (two) times daily. 01/18/23   Blitch, Linde Gillis, NP  albuterol (PROVENTIL HFA;VENTOLIN HFA) 108 (90 Base) MCG/ACT inhaler Inhale 1-2 puffs into the lungs every 6 (six) hours as needed for wheezing or shortness of breath. Patient not taking: No sig reported 04/28/17 01/07/19  Petrucelli, Pleas Koch, PA-C      Allergies    Patient has no known allergies.    Review of Systems   Review of Systems  Genitourinary:  Positive for vaginal discharge.  All other systems reviewed and are negative.   Physical Exam Updated Vital Signs BP 135/71 (BP Location: Right Arm)   Pulse 62   Temp 98.2 F (36.8 C) (Oral)   Resp 16   SpO2 100%  Physical Exam Vitals and nursing note reviewed.  Constitutional:      Appearance: Normal appearance.  HENT:     Head: Normocephalic and atraumatic.     Mouth/Throat:     Mouth: Mucous membranes are moist.  Eyes:      Conjunctiva/sclera: Conjunctivae normal.     Pupils: Pupils are equal, round, and reactive to light.  Cardiovascular:     Rate and Rhythm: Normal rate and regular rhythm.     Pulses: Normal pulses.     Heart sounds: Normal heart sounds.  Pulmonary:     Effort: Pulmonary effort is normal.     Breath sounds: Normal breath sounds.  Abdominal:     Palpations: Abdomen is soft.     Tenderness: There is no abdominal tenderness. There is no right CVA tenderness or left CVA tenderness.  Musculoskeletal:        General: Normal range of motion.     Cervical back: Normal range of motion.  Skin:    General: Skin is warm and dry.     Findings: No rash.  Neurological:     General: No focal deficit present.     Mental Status: She is alert.  Psychiatric:        Mood and Affect: Mood normal.        Behavior: Behavior normal.    ED Results / Procedures / Treatments   Labs (all labs ordered are listed, but only abnormal results are displayed) Labs Reviewed  URINALYSIS, ROUTINE W REFLEX MICROSCOPIC - Abnormal; Notable for the following components:      Result Value   Specific Gravity, Urine 1.033 (*)    All other components within normal  limits  WET PREP, GENITAL  PREGNANCY, URINE  RPR  HIV ANTIBODY (ROUTINE TESTING W REFLEX)  GC/CHLAMYDIA PROBE AMP ([place]) NOT AT [facility]    EKG None  Radiology No results found.  Procedures Procedures: not indicated.   Medications Ordered in ED Medications  cefTRIAXone (ROCEPHIN) injection 500 mg (500 mg Intramuscular Given 05/16/23 0956)  lidocaine (PF) (XYLOCAINE) 1 % injection 1-2.1 mL (1 mL Other Given 05/16/23 0956)  doxycycline (VIBRA-TABS) tablet 100 mg (100 mg Oral Given 05/16/23 1610)    ED Course/ Medical Decision Making/ A&P                                 Medical Decision Making Amount and/or Complexity of Data Reviewed Labs: ordered.   This patient presents to the ED for concern of vaginal discharge, this involves an  extensive number of treatment options, and is a complaint that carries with it a high risk of complications and morbidity.   Differential diagnosis includes: UTI, pyelonephritis, STI, BV, yeast infection, etc.   Comorbidities  See HPI above   Additional History  Additional history obtained from prior records   Lab Tests  I ordered and personally interpreted labs.  The pertinent results include:   Negative pregnancy test UA is unremarkable GC/chlamydia results pending HIV and syphilis testing pending   Problem List / ED Course / Critical Interventions / Medication Management  Yellow malodorous vaginal discharge x 2 weeks with vaginal pain and itching and burning.  No pain with urination or hematuria.  States that boyfriend has same symptoms, but he has not been tested and treated.  She does not have any abdominal pain, dyspareunia, or flank pain.  No fevers, nausea, or vomiting. Discussed findings with patient.  With shared decision making, we decided to treat preemptively for gonorrhea/chlamydia. I ordered medications including: Rocephin and doxycycline for gonorrhea/chlamydia treatment I have reviewed the patients home medicines and have made adjustments as needed   Social Determinants of Health  Social connection   Test / Admission - Considered  Discussed findings with patient.  All questions were answered. She is hemodynamically stable and safe for discharge home. Return precautions given.       Final Clinical Impression(s) / ED Diagnoses Final diagnoses:  STI (sexually transmitted infection)    Rx / DC Orders ED Discharge Orders          Ordered    doxycycline (VIBRAMYCIN) 100 MG capsule  2 times daily        05/16/23 0924              Maxwell Marion, PA-C 05/16/23 9604    Gwyneth Sprout, MD 05/18/23 1108

## 2023-05-16 NOTE — ED Triage Notes (Signed)
 Patient requesting STD testing , reports malodorous vaginal discharge , vaginal itching/burning for 2 weeks .

## 2023-05-18 LAB — GC/CHLAMYDIA PROBE AMP (~~LOC~~) NOT AT ARMC
Chlamydia: NEGATIVE
Comment: NEGATIVE
Comment: NORMAL
Neisseria Gonorrhea: NEGATIVE

## 2023-06-05 ENCOUNTER — Emergency Department (HOSPITAL_COMMUNITY)
Admission: EM | Admit: 2023-06-05 | Discharge: 2023-06-06 | Disposition: A | Discharge status: Home / Self Care | Payer: Self-pay | Attending: Emergency Medicine | Admitting: Emergency Medicine

## 2023-06-05 ENCOUNTER — Emergency Department (HOSPITAL_COMMUNITY): Payer: Self-pay

## 2023-06-05 ENCOUNTER — Other Ambulatory Visit: Payer: Self-pay

## 2023-06-05 DIAGNOSIS — W500XXA Accidental hit or strike by another person, initial encounter: Secondary | ICD-10-CM | POA: Insufficient documentation

## 2023-06-05 DIAGNOSIS — M79644 Pain in right finger(s): Secondary | ICD-10-CM | POA: Insufficient documentation

## 2023-06-05 NOTE — ED Triage Notes (Signed)
 Patient injured her right middle finger with mild swelling 3 days ago while horse playing .

## 2023-06-06 ENCOUNTER — Telehealth: Payer: Self-pay | Admitting: Primary Care

## 2023-06-06 ENCOUNTER — Other Ambulatory Visit: Payer: Self-pay

## 2023-06-06 ENCOUNTER — Telehealth (INDEPENDENT_AMBULATORY_CARE_PROVIDER_SITE_OTHER): Payer: Self-pay | Admitting: Primary Care

## 2023-06-06 NOTE — Telephone Encounter (Signed)
 Copied from CRM [id number]. Topic: Clinical - Medication Refill >> Jun 06, 2023  2:18 PM Fredrica W wrote: Most Recent Primary Care Visit:  Provider: Ricky Stabs J  Department: [facility]  Visit Type: TELEPHONE OFFICE VISIT  Date: 07/17/2020  Medication: Allergy Medication - thinks it was Claritin but I only show Zyrtec   Has the patient contacted their pharmacy? Yes - pharmacy said they sent request a motnh ago (Agent: If no, request that the patient contact the pharmacy for the refill. If patient does not wish to contact the pharmacy document the reason why and proceed with request.) (Agent: If yes, when and what did the pharmacy advise?)  Is this the correct pharmacy for this prescription? Yes If no, delete pharmacy and type the correct one.  This is the patient's preferred pharmacy:  [facility] [address] [place] [address] Phone: [phone] Fax: [phone]   Has the prescription been filled recently? No  Is the patient out of the medication? Yes  Has the patient been seen for an appointment in the last year OR does the patient have an upcoming appointment? Yes  Can we respond through MyChart? Yes  Agent: Please be advised that Rx refills may take up to 3 business days. We ask that you follow-up with your pharmacy.

## 2023-06-06 NOTE — ED Notes (Signed)
 Finger splint applied.

## 2023-06-06 NOTE — Telephone Encounter (Signed)
 This is not a pt at [facility]. Pt had an appt schedule for last year pt no showed

## 2023-06-06 NOTE — Telephone Encounter (Signed)
 Pt walked into the office asking for a medication refill. Pt will need to be seen before by a provider before being prescribed anything. Pt asking is there anything that can be done today because pt was released from ED today. Please advise

## 2023-06-06 NOTE — ED Provider Notes (Signed)
 [place] EMERGENCY DEPARTMENT AT [facility] Provider Note   CSN: [id number] Arrival date & time: 06/05/23  2147     History  Chief Complaint  Patient presents with   Finger Injury    Kimberly Contreras is a 38 y.o. female.  States she was 'playing' with a friend who pulled on her finger somehow. It was her right middle. She looked down and it was dislocated at the PIP joint so she pulled it back in place. She presents here with persistent deformity and swelling, concern for fracture. No other injuries.         Home Medications Prior to Admission medications   Medication Sig Start Date End Date Taking? Authorizing Provider  metroNIDAZOLE (FLAGYL) 500 MG tablet Take 1 tablet (500 mg total) by mouth 2 (two) times daily. 01/18/23   Blitch, Linde Gillis, NP  albuterol (PROVENTIL HFA;VENTOLIN HFA) 108 (90 Base) MCG/ACT inhaler Inhale 1-2 puffs into the lungs every 6 (six) hours as needed for wheezing or shortness of breath. Patient not taking: No sig reported 04/28/17 01/07/19  Petrucelli, Pleas Koch, PA-C      Allergies    Patient has no known allergies.    Review of Systems   Review of Systems  Physical Exam Updated Vital Signs BP (!) 114/46 (BP Location: Right Arm)   Pulse 69   Temp 98.5 F (36.9 C)   Resp 14   SpO2 97%  Physical Exam Vitals and nursing note reviewed.  Constitutional:      Appearance: She is well-developed.  HENT:     Head: Normocephalic and atraumatic.  Cardiovascular:     Rate and Rhythm: Normal rate and regular rhythm.  Pulmonary:     Effort: No respiratory distress.     Breath sounds: No stridor.  Abdominal:     General: There is no distension.  Musculoskeletal:     Cervical back: Normal range of motion.     Comments: Comparing her right mid finger to her left she deftly has some edema around the proximal phalanx, laxity at the proximal interphalangeal joint and she appears to be able to hyperextend it as well.  No injuries elsewhere.   Neurological:     Mental Status: She is alert.     ED Results / Procedures / Treatments   Labs (all labs ordered are listed, but only abnormal results are displayed) Labs Reviewed - No data to display  EKG None  Radiology DG Finger Middle Right Result Date: 06/05/2023 CLINICAL DATA:  Injured right middle finger.  Swelling EXAM: RIGHT MIDDLE FINGER 2+V COMPARISON:  None Available. FINDINGS: There is no evidence of fracture or dislocation. There is no evidence of arthropathy or other focal bone abnormality. No erosive change. Mild soft tissue edema. IMPRESSION: Mild soft tissue edema. No fracture or subluxation of the right middle finger. Electronically Signed   By: Narda Rutherford M.D.   On: 06/05/2023 22:23    Procedures Procedures    Medications Ordered in ED Medications - No data to display  ED Course/ Medical Decision Making/ A&P                                 Medical Decision Making Amount and/or Complexity of Data Reviewed Radiology: ordered.   With her description of the symptoms and the laxity she has now may have a full tear of one of the ligaments will on her right middle finger.  No fracture on x-ray.  She is not dislocated now.  She has good strength and can make a fist and spread her fingers out.  Will splint it for now for stability.  Discussed that if is not 100% improved in about a week she is to follow-up with hand surgery for further workup and management.  No indication for further workup at this time.  Final Clinical Impression(s) / ED Diagnoses Final diagnoses:  Finger pain, right    Rx / DC Orders ED Discharge Orders     None         Romney Compean, Barbara Cower, MD 06/06/23 [phone]

## 2023-06-06 NOTE — Discharge Instructions (Addendum)
 I suspect you dislocated and reduced it prior to coming in. You likely at least sprained a couple of the ligaments holding it in place as it is a little bit unstable on exam. Sometimes this will improve on it's own with time, sometimes it can be torn. XRay would not show a tear, just shows that there's no fracture and that it is in the right place now. Please follow up with hand surgeon above in 1-2 weeks if not improving.

## 2023-06-29 ENCOUNTER — Encounter (HOSPITAL_COMMUNITY): Payer: Self-pay | Admitting: Emergency Medicine

## 2023-06-29 ENCOUNTER — Emergency Department (HOSPITAL_COMMUNITY)
Admission: EM | Admit: 2023-06-29 | Discharge: 2023-06-30 | Disposition: A | Discharge status: Home / Self Care | Payer: Self-pay | Attending: Emergency Medicine | Admitting: Emergency Medicine

## 2023-06-29 ENCOUNTER — Other Ambulatory Visit: Payer: Self-pay

## 2023-06-29 DIAGNOSIS — Z202 Contact with and (suspected) exposure to infections with a predominantly sexual mode of transmission: Secondary | ICD-10-CM | POA: Insufficient documentation

## 2023-06-29 DIAGNOSIS — N898 Other specified noninflammatory disorders of vagina: Secondary | ICD-10-CM | POA: Insufficient documentation

## 2023-06-29 LAB — URINALYSIS, ROUTINE W REFLEX MICROSCOPIC
Bilirubin Urine: NEGATIVE
Glucose, UA: NEGATIVE mg/dL
Hgb urine dipstick: NEGATIVE
Ketones, ur: 5 mg/dL — AB
Leukocytes,Ua: NEGATIVE
Nitrite: NEGATIVE
Protein, ur: NEGATIVE mg/dL
Specific Gravity, Urine: 1.032 — ABNORMAL HIGH (ref 1.005–1.030)
pH: 5 (ref 5.0–8.0)

## 2023-06-29 NOTE — ED Triage Notes (Signed)
 Patient reports being diagnosed a few weeks ago with an STD and states the abx didn't work, patient still having symptoms.  Patient endorses dysuria.

## 2023-06-30 ENCOUNTER — Other Ambulatory Visit: Payer: Self-pay

## 2023-06-30 LAB — GC/CHLAMYDIA PROBE AMP (~~LOC~~) NOT AT ARMC
Chlamydia: NEGATIVE
Comment: NEGATIVE
Comment: NORMAL
Neisseria Gonorrhea: NEGATIVE

## 2023-06-30 LAB — PREGNANCY, URINE: Preg Test, Ur: NEGATIVE

## 2023-06-30 LAB — WET PREP, GENITAL
Clue Cells Wet Prep HPF POC: NONE SEEN
Sperm: NONE SEEN
Trich, Wet Prep: NONE SEEN
WBC, Wet Prep HPF POC: <10 (ref <10)
Yeast Wet Prep HPF POC: NONE SEEN

## 2023-06-30 LAB — HIV ANTIBODY (ROUTINE TESTING W REFLEX): HIV Screen 4th Generation wRfx: NONREACTIVE

## 2023-06-30 MED ORDER — STERILE WATER FOR INJECTION IJ SOLN
INTRAMUSCULAR | Status: AC
  Filled 2023-06-30: qty 10

## 2023-06-30 MED ORDER — CEFTRIAXONE SODIUM 500 MG IJ SOLR
500.0000 mg | Freq: Once | INTRAMUSCULAR | Status: DC
  Filled 2023-06-30: qty 500

## 2023-06-30 MED ORDER — FLUCONAZOLE 150 MG PO TABS
150.0000 mg | ORAL_TABLET | Freq: Once | ORAL | Status: AC
Start: 2023-06-30 — End: 2023-06-30
  Administered 2023-06-30: 150 mg via ORAL
  Filled 2023-06-30: qty 1

## 2023-06-30 MED ORDER — DOXYCYCLINE HYCLATE 100 MG PO CAPS
100.0000 mg | ORAL_CAPSULE | Freq: Two times a day (BID) | ORAL | 0 refills | Status: DC
  Filled 2023-06-30: qty 20, 10d supply, fill #0

## 2023-06-30 MED ORDER — DOXYCYCLINE HYCLATE 100 MG PO TABS
100.0000 mg | ORAL_TABLET | Freq: Once | ORAL | Status: DC
  Filled 2023-06-30: qty 1

## 2023-06-30 NOTE — ED Notes (Signed)
 Pt call and not seen in the lobby no answer.

## 2023-06-30 NOTE — ED Provider Notes (Signed)
 [facility] Provider Note MRN:  [id number]  Arrival date & time: 06/30/23     Chief Complaint   SEXUALLY TRANSMITTED DISEASE   History of Present Illness   Kimberly Contreras is a 38 y.o. year-old female presents to the ED with chief complaint of fishy smelling vaginal discharge.  She states that she has been having symptoms for the past couple of weeks.  Was treated for the same couple weeks ago but did not improve.  She also reports having some dysuria.  She states that occasionally she notices a rash, but she does not have a rash now.  She denies pregnancy or breast-feeding.  Denies any other associated symptoms.  History provided by patient.   Review of Systems  Pertinent positive and negative review of systems noted in HPI.    Physical Exam   Vitals:   06/29/23 2304  BP: (!) 154/78  Pulse: 72  Resp: 16  Temp: (!) 97.3 F (36.3 C)  SpO2: 100%    CONSTITUTIONAL:  non toxic-appearing, NAD NEURO:  Alert and oriented x 3, CN 3-12 grossly intact EYES:  eyes equal and reactive ENT/NECK:  Supple, no stridor  CARDIO:   appears well-perfused  PULM:  No respiratory distress GI/GU:  non-distended,  MSK/SPINE:  No gross deformities, no edema, moves all extremities  SKIN:  no rash, atraumatic   *Additional and/or pertinent findings included in MDM below  Diagnostic and Interventional Summary    EKG Interpretation Date/Time:    Ventricular Rate:    PR Interval:    QRS Duration:    QT Interval:    QTC Calculation:   R Axis:      Text Interpretation:         Labs Reviewed  URINALYSIS, ROUTINE W REFLEX MICROSCOPIC - Abnormal; Notable for the following components:      Result Value   Specific Gravity, Urine 1.032 (*)    Ketones, ur 5 (*)    All other components within normal limits  WET PREP, GENITAL  PREGNANCY, URINE  HIV ANTIBODY (ROUTINE TESTING W REFLEX)  GC/CHLAMYDIA PROBE AMP ([place]) NOT AT [facility]    No orders  to display    Medications  doxycycline (VIBRA-TABS) tablet 100 mg (has no administration in time range)  cefTRIAXone (ROCEPHIN) injection 500 mg (has no administration in time range)  fluconazole (DIFLUCAN) tablet 150 mg (has no administration in time range)     Procedures  /  Critical Care Procedures  ED Course and Medical Decision Making  I have reviewed the triage vital signs, the nursing notes, and pertinent available records from the EMR.  Social Determinants Affecting Complexity of Care: Patient has no clinically significant social determinants affecting this chief complaint..   ED Course:    Medical Decision Making Patient here with concern over STD.  She states that she has had some foul-smelling vaginal discharge.  She denies pelvic pain.  Urinalysis is largely unremarkable.  Wet prep is negative.  GC probes are pending.  Will treat with Doxy and Rocephin.  Will also send Rx for Diflucan.  Pregnancy test negative.  Patient reassured.  Recommend PCP follow-up.  Amount and/or Complexity of Data Reviewed Labs: ordered.  Risk Prescription drug management.         Consultants: No consultations were needed in caring for this patient.   Treatment and Plan: Emergency department workup does not suggest an emergent condition requiring admission or immediate intervention beyond  what has been performed at  this time. The patient is safe for discharge and has  been instructed to return immediately for worsening symptoms, change in  symptoms or any other concerns    Final Clinical Impressions(s) / ED Diagnoses     ICD-10-CM   1. Vaginal discharge  N89.8     2. Possible exposure to STD  Z20.2       ED Discharge Orders          Ordered    doxycycline (VIBRAMYCIN) 100 MG capsule  2 times daily        06/30/23 0540              Discharge Instructions Discussed with and Provided to Patient:   Discharge Instructions   None      Sherel Dikes,  PA-C 06/30/23 0541    Teddi Favors, DO 06/30/23 [phone]

## 2023-07-10 ENCOUNTER — Other Ambulatory Visit: Payer: Self-pay

## 2023-07-22 ENCOUNTER — Emergency Department (HOSPITAL_COMMUNITY)
Admission: EM | Admit: 2023-07-22 | Discharge: 2023-07-23 | Disposition: A | Discharge status: Home / Self Care | Attending: Emergency Medicine | Admitting: Emergency Medicine

## 2023-07-22 ENCOUNTER — Other Ambulatory Visit: Payer: Self-pay

## 2023-07-22 DIAGNOSIS — Z202 Contact with and (suspected) exposure to infections with a predominantly sexual mode of transmission: Secondary | ICD-10-CM | POA: Insufficient documentation

## 2023-07-22 NOTE — ED Triage Notes (Signed)
 PT states she has symptoms of Trichomonas "smells like fish, itches down there, rash, green discharge"  States has been seen for same before but hasn't received the right medication.

## 2023-07-23 ENCOUNTER — Encounter (HOSPITAL_COMMUNITY): Payer: Self-pay

## 2023-07-23 LAB — URINALYSIS, ROUTINE W REFLEX MICROSCOPIC
Bilirubin Urine: NEGATIVE
Glucose, UA: NEGATIVE mg/dL
Hgb urine dipstick: NEGATIVE
Ketones, ur: NEGATIVE mg/dL
Leukocytes,Ua: NEGATIVE
Nitrite: NEGATIVE
Protein, ur: NEGATIVE mg/dL
Specific Gravity, Urine: <1.005 — ABNORMAL LOW (ref 1.005–1.030)
pH: 6 (ref 5.0–8.0)

## 2023-07-23 LAB — PREGNANCY, URINE: Preg Test, Ur: NEGATIVE

## 2023-07-23 MED ORDER — METRONIDAZOLE 500 MG PO TABS
2000.0000 mg | ORAL_TABLET | Freq: Once | ORAL | Status: AC
  Administered 2023-07-23: 2000 mg via ORAL
  Filled 2023-07-23: qty 4

## 2023-07-23 NOTE — ED Notes (Signed)
 Pt provided urine sample in lobby, sent at 0040.

## 2023-07-23 NOTE — ED Provider Notes (Signed)
 [place] EMERGENCY DEPARTMENT AT [facility] Provider Note   CSN: [id number] Arrival date & time: 07/22/23  2257     History  Chief Complaint  Patient presents with   Exposure to STD    Kimberly Contreras is a 38 y.o. female.  The history is provided by the patient.  Exposure to STD Chronicity: seen for STIs and partner had trichomonas and partrner was treated for this and patient reports she does not feel she was appropropriately treated. The problem occurs constantly. The problem has not changed since onset.Pertinent negatives include no chest pain, no abdominal pain, no headaches and no shortness of breath. Nothing aggravates the symptoms. Nothing relieves the symptoms. She has tried nothing for the symptoms. The treatment provided no relief.       Home Medications Prior to Admission medications   Medication Sig Start Date End Date Taking? Authorizing Provider  doxycycline  (VIBRAMYCIN ) 100 MG capsule Take 1 capsule (100 mg total) by mouth 2 (two) times daily. 06/30/23   Sherel Dikes, PA-C  metroNIDAZOLE  (FLAGYL ) 500 MG tablet Take 1 tablet (500 mg total) by mouth 2 (two) times daily. 01/18/23   Blitch, Dasie Epps, NP  albuterol  (PROVENTIL  HFA;VENTOLIN  HFA) 108 (90 Base) MCG/ACT inhaler Inhale 1-2 puffs into the lungs every 6 (six) hours as needed for wheezing or shortness of breath. Patient not taking: No sig reported 04/28/17 01/07/19  Petrucelli, Arlon Bergamo, PA-C      Allergies    Patient has no known allergies.    Review of Systems   Review of Systems  Constitutional:  Negative for fever.  Respiratory:  Negative for shortness of breath.   Cardiovascular:  Negative for chest pain.  Gastrointestinal:  Negative for abdominal pain.  Neurological:  Negative for headaches.  All other systems reviewed and are negative.   Physical Exam Updated Vital Signs BP (!) 121/58 (BP Location: Right Arm)   Pulse 66   Temp 97.8 F (36.6 C) (Oral)   Resp 18   Ht 5\' 11"   (1.803 m)   Wt 110.7 kg   LMP 06/23/2023 (Exact Date)   SpO2 100%   BMI 34.03 kg/m  Physical Exam Vitals and nursing note reviewed.  Constitutional:      General: She is not in acute distress.    Appearance: Normal appearance. She is well-developed.  HENT:     Head: Normocephalic and atraumatic.     Nose: Nose normal.  Eyes:     Pupils: Pupils are equal, round, and reactive to light.  Cardiovascular:     Rate and Rhythm: Normal rate and regular rhythm.     Pulses: Normal pulses.     Heart sounds: Normal heart sounds.  Pulmonary:     Effort: No respiratory distress.     Breath sounds: Normal breath sounds.  Abdominal:     General: Bowel sounds are normal. There is no distension.     Palpations: Abdomen is soft.     Tenderness: There is no abdominal tenderness. There is no guarding or rebound.  Musculoskeletal:        General: Normal range of motion.     Cervical back: Normal range of motion and neck supple.  Skin:    General: Skin is warm and dry.     Capillary Refill: Capillary refill takes less than 2 seconds.     Findings: No erythema or rash.  Neurological:     General: No focal deficit present.     Mental Status: She is  alert and oriented to person, place, and time.     Deep Tendon Reflexes: Reflexes normal.  Psychiatric:        Mood and Affect: Mood normal.        Behavior: Behavior normal.     ED Results / Procedures / Treatments   Labs (all labs ordered are listed, but only abnormal results are displayed) Labs Reviewed  URINALYSIS, ROUTINE W REFLEX MICROSCOPIC - Abnormal; Notable for the following components:      Result Value   Color, Urine STRAW (*)    Specific Gravity, Urine <1.005 (*)    All other components within normal limits  PREGNANCY, URINE    EKG None  Radiology No results found.  Procedures Procedures    Medications Ordered in ED Medications  metroNIDAZOLE  (FLAGYL ) tablet 2,000 mg (2,000 mg Oral Given 07/23/23 0540)    ED Course/  Medical Decision Making/ A&P                                 Medical Decision Making Seen for STIs earlier this month and wanted to be seen for trichomonas and was not treated for that and returns wanting treatment for trichomonas   Amount and/or Complexity of Data Reviewed External Data Reviewed: notes.    Details: Previous notes reviewed  Labs: ordered.  Risk Prescription drug management. Risk Details: Patient is well appearing.  Stable for discharge.  Strict returns.      Final Clinical Impression(s) / ED Diagnoses Final diagnoses:  STD exposure   No signs of systemic illness or infection. The patient is nontoxic-appearing on exam and vital signs are within normal limits.  I have reviewed the triage vital signs and the nursing notes. Pertinent labs & imaging results that were available during my care of the patient were reviewed by me and considered in my medical decision making (see chart for details). After history, exam, and medical workup I feel the patient has been appropriately medically screened and is safe for discharge home. Pertinent diagnoses were discussed with the patient. Patient was given return precautions. Rx / DC Orders ED Discharge Orders     None         Denisha Hoel, MD 07/23/23 [phone]

## 2023-07-23 NOTE — ED Notes (Signed)
 Called lab to add urine preg to urine already obtained

## 2024-01-23 ENCOUNTER — Encounter (HOSPITAL_COMMUNITY): Payer: Self-pay

## 2024-01-23 ENCOUNTER — Emergency Department (HOSPITAL_COMMUNITY)
Admission: EM | Admit: 2024-01-23 | Discharge: 2024-01-23 | Disposition: A | Discharge status: Home / Self Care | Attending: Emergency Medicine | Admitting: Emergency Medicine

## 2024-01-23 ENCOUNTER — Other Ambulatory Visit: Payer: Self-pay

## 2024-01-23 DIAGNOSIS — Z202 Contact with and (suspected) exposure to infections with a predominantly sexual mode of transmission: Secondary | ICD-10-CM | POA: Insufficient documentation

## 2024-01-23 DIAGNOSIS — F17290 Nicotine dependence, other tobacco product, uncomplicated: Secondary | ICD-10-CM | POA: Diagnosis not present

## 2024-01-23 LAB — COMPREHENSIVE METABOLIC PANEL WITH GFR
ALT: 14 U/L (ref 0–44)
AST: 22 U/L (ref 15–41)
Albumin: 3.9 g/dL (ref 3.5–5.0)
Alkaline Phosphatase: 43 U/L (ref 38–126)
Anion gap: 14 (ref 5–15)
BUN: 6 mg/dL (ref 6–20)
CO2: 19 mmol/L — ABNORMAL LOW (ref 22–32)
Calcium: 9.2 mg/dL (ref 8.9–10.3)
Chloride: 104 mmol/L (ref 98–111)
Creatinine, Ser: 0.66 mg/dL (ref 0.44–1.00)
GFR, Estimated: >60 mL/min (ref >60)
Glucose, Bld: 75 mg/dL (ref 70–99)
Potassium: 3.5 mmol/L (ref 3.5–5.1)
Sodium: 137 mmol/L (ref 135–145)
Total Bilirubin: 0.6 mg/dL (ref 0.0–1.2)
Total Protein: 7 g/dL (ref 6.5–8.1)

## 2024-01-23 LAB — WET PREP, GENITAL
Clue Cells Wet Prep HPF POC: NONE SEEN
Sperm: NONE SEEN
Trich, Wet Prep: NONE SEEN
WBC, Wet Prep HPF POC: <10 (ref <10)

## 2024-01-23 LAB — URINALYSIS, W/ REFLEX TO CULTURE (INFECTION SUSPECTED)
Bilirubin Urine: NEGATIVE
Glucose, UA: NEGATIVE mg/dL
Hgb urine dipstick: NEGATIVE
Ketones, ur: 20 mg/dL — AB
Leukocytes,Ua: NEGATIVE
Nitrite: NEGATIVE
Protein, ur: NEGATIVE mg/dL
Specific Gravity, Urine: 1.015 (ref 1.005–1.030)
pH: 5 (ref 5.0–8.0)

## 2024-01-23 LAB — CBC WITH DIFFERENTIAL/PLATELET
Abs Immature Granulocytes: 0.02 K/uL (ref 0.00–0.07)
Basophils Absolute: 0.1 K/uL (ref 0.0–0.1)
Basophils Relative: 1 %
Eosinophils Absolute: 0.1 K/uL (ref 0.0–0.5)
Eosinophils Relative: 1 %
HCT: 43.1 % (ref 36.0–46.0)
Hemoglobin: 13.9 g/dL (ref 12.0–15.0)
Immature Granulocytes: 0 %
Lymphocytes Relative: 25 %
Lymphs Abs: 1.6 K/uL (ref 0.7–4.0)
MCH: 28.5 pg (ref 26.0–34.0)
MCHC: 32.3 g/dL (ref 30.0–36.0)
MCV: 88.5 fL (ref 80.0–100.0)
Monocytes Absolute: 0.7 K/uL (ref 0.1–1.0)
Monocytes Relative: 11 %
Neutro Abs: 4.1 K/uL (ref 1.7–7.7)
Neutrophils Relative %: 62 %
Platelets: 253 K/uL (ref 150–400)
RBC: 4.87 MIL/uL (ref 3.87–5.11)
RDW: 14.3 % (ref 11.5–15.5)
WBC: 6.6 K/uL (ref 4.0–10.5)
nRBC: 0 % (ref 0.0–0.2)

## 2024-01-23 LAB — HCG, SERUM, QUALITATIVE: Preg, Serum: NEGATIVE

## 2024-01-23 MED ORDER — DOXYCYCLINE HYCLATE 100 MG PO TABS
100.0000 mg | ORAL_TABLET | Freq: Two times a day (BID) | ORAL | 0 refills | Status: AC
  Filled 2024-01-23: qty 14, 7d supply, fill #0

## 2024-01-23 MED ORDER — FLUCONAZOLE 150 MG PO TABS
150.0000 mg | ORAL_TABLET | Freq: Once | ORAL | 0 refills | Status: DC | PRN
Start: 2024-01-23 — End: 2024-02-13
  Filled 2024-01-23: qty 2, 2d supply, fill #0

## 2024-01-23 MED ORDER — CEFTRIAXONE SODIUM 500 MG IJ SOLR
500.0000 mg | Freq: Once | INTRAMUSCULAR | Status: AC
  Administered 2024-01-23: 500 mg via INTRAMUSCULAR
  Filled 2024-01-23: qty 500

## 2024-01-23 MED ORDER — FLUCONAZOLE 150 MG PO TABS
150.0000 mg | ORAL_TABLET | Freq: Once | ORAL | 0 refills | Status: DC | PRN
  Filled 2024-01-23: qty 1, fill #0

## 2024-01-23 MED ORDER — LIDOCAINE HCL (PF) 1 % IJ SOLN
1.0000 mL | Freq: Once | INTRAMUSCULAR | Status: AC
  Administered 2024-01-23: 1 mL
  Filled 2024-01-23: qty 5

## 2024-01-23 MED ORDER — METRONIDAZOLE 500 MG PO TABS
500.0000 mg | ORAL_TABLET | Freq: Two times a day (BID) | ORAL | 0 refills | Status: DC
  Filled 2024-01-23: qty 14, 7d supply, fill #0

## 2024-01-23 NOTE — ED Triage Notes (Signed)
 Pt reports that she is having symptoms of an STD pt reports that she has been treated for STDs here previosly and that she thinks the last time she was treated that it may not have gone away. Pt reports a foul odor from her vagina, and painful urination.

## 2024-01-23 NOTE — ED Provider Notes (Signed)
  EMERGENCY DEPARTMENT AT [facility] Provider Note  CSN: [id number] Arrival date & time: 01/23/24 0053  Chief Complaint(s) Exposure to STD  HPI Kimberly Contreras is a 38 y.o. female with past medical history as below, significant for anxiety, depression, obesity who presents to the ED with complaint of STI check  Patient believes that she has developed another STD.  She was seen in April with similar complaints, she was treated and her symptoms resolved.  Patient believes that she was exposed again.  Having vaginal discharge that is green at times, painful.  Some dysuria.  No rashes or wounds.  No significant pelvic pain, nausea or vomiting, no fevers or chills.  No change to bowel activity, no vomiting.  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 Authorizing Provider  doxycycline  (VIBRA -TABS) 100 MG tablet Take 1 tablet (100 mg total) by mouth 2 (two) times daily. 01/23/24  Yes Elnor Savant A, DO  fluconazole  (DIFLUCAN ) 150 MG tablet Take 1 tablet (150 mg total) by mouth once as needed for up to 1 dose. 01/23/24   Elnor Savant LABOR, DO  albuterol  (PROVENTIL  HFA;VENTOLIN  HFA) 108 (90 Base) MCG/ACT inhaler Inhale 1-2 puffs into the lungs every 6 (six) hours as needed for wheezing or shortness of breath. Patient not taking: No sig  reported 04/28/17 01/07/19  Petrucelli, Lucie SAUNDERS, PA-C                                                                                                                                    Past Surgical History Past Surgical History:  Procedure Laterality Date   APPENDECTOMY  2013    Family History Family History  Problem Relation Age of Onset   Healthy Mother    Healthy Father    Cancer Other        breast in [facility], great aunt    Hypertension Other    Diabetes Other     Social History Social History   Tobacco Use   Smoking status: Every Day    Types: Cigars   Smokeless tobacco: Never  Vaping Use   Vaping status: Never Used  Substance Use Topics   Alcohol use: Yes    Alcohol/week: 2.0 standard drinks of alcohol  Types: 2 Shots of liquor per week    Comment: occ   Drug use: No   Allergies Patient has no known allergies.  Review of Systems A thorough review of systems was obtained and all systems are negative except as noted in the HPI and PMH.   Physical Exam Vital Signs  I have reviewed the triage vital signs BP 115/64 (BP Location: Right Arm)   Pulse 68   Temp 98.1 F (36.7 C) (Oral)   Resp 16   Ht 5' 11 (1.803 m)   Wt 108.9 kg   LMP 01/15/2024 (Approximate)   SpO2 100%   BMI 33.47 kg/m  Physical Exam Vitals and nursing note reviewed.  Constitutional:      General: She is not in acute distress.    Appearance: Normal appearance. She is well-developed. She is not ill-appearing.  HENT:     Head: Normocephalic and atraumatic.     Right Ear: External ear normal.     Left Ear: External ear normal.     Nose: Nose normal.     Mouth/Throat:     Mouth: Mucous membranes are moist.  Eyes:     General: No scleral icterus.       Right eye: No discharge.        Left eye: No discharge.  Cardiovascular:     Rate and Rhythm: Normal rate.  Pulmonary:     Effort: Pulmonary effort is normal. No respiratory distress.     Breath sounds: No stridor.  Abdominal:      General: Abdomen is flat. There is no distension.     Palpations: Abdomen is soft.     Tenderness: There is no abdominal tenderness. There is no guarding.  Musculoskeletal:        General: No deformity.     Cervical back: No rigidity.  Skin:    General: Skin is warm and dry.     Coloration: Skin is not cyanotic, jaundiced or pale.  Neurological:     Mental Status: She is alert.  Psychiatric:        Speech: Speech normal.        Behavior: Behavior normal. Behavior is cooperative.     ED Results and Treatments Labs (all labs ordered are listed, but only abnormal results are displayed) Labs Reviewed  WET PREP, GENITAL - Abnormal; Notable for the following components:      Result Value   Yeast Wet Prep HPF POC PRESENT (*)    All other components within normal limits  COMPREHENSIVE METABOLIC PANEL WITH GFR - Abnormal; Notable for the following components:   CO2 19 (*)    All other components within normal limits  URINALYSIS, W/ REFLEX TO CULTURE (INFECTION SUSPECTED) - Abnormal; Notable for the following components:   Ketones, ur 20 (*)    Bacteria, UA RARE (*)    All other components within normal limits  CBC WITH DIFFERENTIAL/PLATELET  HCG, SERUM, QUALITATIVE  GC/CHLAMYDIA PROBE AMP ([place]) NOT AT [facility]  Radiology No results found.  Pertinent labs & imaging results that were available during my care of the patient were reviewed by me and considered in my medical decision making (see MDM for details).  Medications Ordered in ED Medications  cefTRIAXone  (ROCEPHIN ) injection 500 mg (500 mg Intramuscular Given 01/23/24 0907)  lidocaine  (PF) (XYLOCAINE ) 1 % injection 1-2.1 mL (1 mL Other Given 01/23/24 0907)                                                                                                                                      Procedures Procedures  (including critical care time)  Medical Decision Making / ED Course    Medical Decision Making:    Kimberly Contreras is a 38 y.o. female with past medical history as below, significant for anxiety, depression, obesity who presents to the ED with complaint of STI check. The complaint involves an extensive differential diagnosis and also carries with it a high risk of complications and morbidity.  Serious etiology was considered. Ddx includes but is not limited to: STI, UTI, pregnancy, cellulitis, yeast infection, BV, etc.  Complete initial physical exam performed, notably the patient was in no acute distress, resting comfortably.    Reviewed and confirmed nursing documentation for past medical history, family history, social history.  Vital signs reviewed.    Vaginal discharge Exposure to STI> - Patient reports possible exposure to STI, green discharge, dysuria - She is agreeable to empiric testing, she is opted for self swab - Patient also requesting medication for yeast infection as she typically develops these infections associated with antibiotic use - yeast noted on wet prep    I have discussed the diagnosis/risks/treatment options with the patient.  Evaluation and diagnostic testing in the emergency department does not suggest an emergent condition requiring admission or immediate intervention beyond what has been performed at this time.  They will follow up with pcp. We also discussed returning to the ED immediately if new or worsening sx occur. We discussed the sx which are most concerning (e.g., sudden worsening pain, fever, inability to tolerate by mouth) that necessitate immediate return.    The patient appears reasonably screened and/or stabilized for discharge and I doubt any other medical condition or other [facility] requiring further screening, evaluation, or treatment in the ED at this time prior to discharge.                         Additional history obtained: -Additional history obtained from na -External records from outside source obtained and reviewed including: Chart review including previous notes, labs, imaging, consultation notes including  Prior er eval   Lab Tests: -I ordered, reviewed, and interpreted labs.   The pertinent results include:   Labs Reviewed  WET PREP, GENITAL - Abnormal; Notable for the following components:      Result Value   Yeast Wet Prep HPF  POC PRESENT (*)    All other components within normal limits  COMPREHENSIVE METABOLIC PANEL WITH GFR - Abnormal; Notable for the following components:   CO2 19 (*)    All other components within normal limits  URINALYSIS, W/ REFLEX TO CULTURE (INFECTION SUSPECTED) - Abnormal; Notable for the following components:   Ketones, ur 20 (*)    Bacteria, UA RARE (*)    All other components within normal limits  CBC WITH DIFFERENTIAL/PLATELET  HCG, SERUM, QUALITATIVE  GC/CHLAMYDIA PROBE AMP ([place]) NOT AT [facility]    Notable for yeast on wet prep  EKG   EKG Interpretation Date/Time:    Ventricular Rate:    PR Interval:    QRS Duration:    QT Interval:    QTC Calculation:   R Axis:      Text Interpretation:           Imaging Studies ordered: na   Medicines ordered and prescription drug management: Meds ordered this encounter  Medications   cefTRIAXone  (ROCEPHIN ) injection 500 mg    Antibiotic Indication::   STD   lidocaine  (PF) (XYLOCAINE ) 1 % injection 1-2.1 mL   doxycycline  (VIBRA -TABS) 100 MG tablet    Sig: Take 1 tablet (100 mg total) by mouth 2 (two) times daily.    Dispense:  14 tablet    Refill:  0   DISCONTD: metroNIDAZOLE  (FLAGYL ) 500 MG tablet    Sig: Take 1 tablet (500 mg total) by mouth 2 (two) times daily.    Dispense:  14 tablet    Refill:  0   DISCONTD: fluconazole  (DIFLUCAN ) 150 MG tablet    Sig: Take 1 tablet (150 mg total) by mouth once as needed for up to 1 dose.     Dispense:  1 tablet    Refill:  0   fluconazole  (DIFLUCAN ) 150 MG tablet    Sig: Take 1 tablet (150 mg total) by mouth once as needed for up to 1 dose.    Dispense:  2 tablet    Refill:  0    -I have reviewed the patients home medicines and have made adjustments as needed   Consultations Obtained: na   Cardiac Monitoring: Continuous pulse oximetry interpreted by myself, 100% on RA.    Social Determinants of Health:  Diagnosis or treatment significantly limited by social determinants of health: current smoker and obesity Counseled patient for approximately 3 minutes regarding smoking cessation. Discussed risks of smoking and how they applied and affected their visit here today. Patient not ready to quit at this time, however will follow up with their primary doctor when they are.   CPT code: 00593: intermediate counseling for smoking cessation     Reevaluation: After the interventions noted above, I reevaluated the patient and found that they have improved  Co morbidities that complicate the patient evaluation  Past Medical History:  Diagnosis Date   Abscess    Anxiety Dx 2013   Depression Dx 2013   Obesity       Dispostion: Disposition decision including need for hospitalization was considered, and patient discharged from emergency department.    Final Clinical Impression(s) / ED Diagnoses Final diagnoses:  STD exposure        Elnor Jayson LABOR, DO 01/23/24 1605

## 2024-01-23 NOTE — Discharge Instructions (Addendum)
 It was a pleasure caring for you today in the emergency department.  The results from your gonorrhea/chlamydia/HIV/syphilis will be available on MyChart in the next 24 hours.  Please abstain from sexual activity until you complete the antibiotics that were prescribed today  Your swab shows a yeast infection, you were prescribed fluconazole  to treat this. You can take one dose today and another dose after you complete your antibiotics if needed.   Please return to the emergency department for any worsening or worrisome symptoms.

## 2024-01-26 LAB — GC/CHLAMYDIA PROBE AMP (~~LOC~~) NOT AT ARMC
Chlamydia: NEGATIVE
Comment: NEGATIVE
Comment: NORMAL
Neisseria Gonorrhea: NEGATIVE

## 2024-02-02 ENCOUNTER — Other Ambulatory Visit: Payer: Self-pay

## 2024-02-03 ENCOUNTER — Other Ambulatory Visit: Payer: Self-pay

## 2024-02-12 ENCOUNTER — Emergency Department (HOSPITAL_COMMUNITY)
Admission: EM | Admit: 2024-02-12 | Discharge: 2024-02-13 | Disposition: A | Discharge status: Home / Self Care | Attending: Emergency Medicine | Admitting: Emergency Medicine

## 2024-02-12 ENCOUNTER — Other Ambulatory Visit: Payer: Self-pay

## 2024-02-12 DIAGNOSIS — N898 Other specified noninflammatory disorders of vagina: Secondary | ICD-10-CM | POA: Insufficient documentation

## 2024-02-12 LAB — URINALYSIS, ROUTINE W REFLEX MICROSCOPIC
Bilirubin Urine: NEGATIVE
Glucose, UA: NEGATIVE mg/dL
Hgb urine dipstick: NEGATIVE
Ketones, ur: 5 mg/dL — AB
Leukocytes,Ua: NEGATIVE
Nitrite: NEGATIVE
Protein, ur: NEGATIVE mg/dL
Specific Gravity, Urine: 1.032 — ABNORMAL HIGH (ref 1.005–1.030)
pH: 5 (ref 5.0–8.0)

## 2024-02-12 LAB — POC URINE PREG, ED: Preg Test, Ur: NEGATIVE

## 2024-02-12 NOTE — ED Triage Notes (Signed)
 Pt arrives POV with complaints of discharge with a foul order and green color and burning with urination. Pt was seen on 11/7 for the same and was treated with antibiotics here and prescribed abx but never started the medication at home due to already knowing that they dont work.

## 2024-02-13 ENCOUNTER — Other Ambulatory Visit: Payer: Self-pay

## 2024-02-13 LAB — WET PREP, GENITAL
Clue Cells Wet Prep HPF POC: NONE SEEN
Sperm: NONE SEEN
Trich, Wet Prep: NONE SEEN
WBC, Wet Prep HPF POC: >=10 — AB (ref <10)
Yeast Wet Prep HPF POC: NONE SEEN

## 2024-02-13 LAB — GC/CHLAMYDIA PROBE AMP (~~LOC~~) NOT AT ARMC
Chlamydia: NEGATIVE
Comment: NEGATIVE
Comment: NORMAL
Neisseria Gonorrhea: NEGATIVE

## 2024-02-13 MED ORDER — STERILE WATER FOR INJECTION IJ SOLN
INTRAMUSCULAR | Status: AC
  Filled 2024-02-13: qty 10

## 2024-02-13 MED ORDER — FLUCONAZOLE 150 MG PO TABS
150.0000 mg | ORAL_TABLET | Freq: Once | ORAL | 0 refills | Status: AC | PRN
  Filled 2024-02-13: qty 1, 1d supply, fill #0

## 2024-02-13 MED ORDER — AZITHROMYCIN 250 MG PO TABS
1000.0000 mg | ORAL_TABLET | Freq: Once | ORAL | Status: AC
Start: 2024-02-13 — End: 2024-02-13
  Administered 2024-02-13: 1000 mg via ORAL
  Filled 2024-02-13: qty 4

## 2024-02-13 MED ORDER — CEFTRIAXONE SODIUM 500 MG IJ SOLR
500.0000 mg | Freq: Once | INTRAMUSCULAR | Status: AC
Start: 2024-02-13 — End: 2024-02-13
  Administered 2024-02-13: 500 mg via INTRAMUSCULAR
  Filled 2024-02-13: qty 500

## 2024-02-13 NOTE — Discharge Instructions (Addendum)
 You have been treated for STDs in the emergency department.  We have also provided a prescription for Diflucan  should you develop symptoms of a yeast infection after administration of antibiotics.  Follow-up with a primary care doctor or the health department for any recurrent complaints.  You may return to the ED for new or concerning symptoms.

## 2024-02-13 NOTE — ED Provider Notes (Signed)
 [place] EMERGENCY DEPARTMENT AT [facility] Provider Note   CSN: [id number] Arrival date & time: 02/12/24  2044     Patient presents with: No chief complaint on file.   Kimberly Contreras is a 38 y.o. female.  {Add pertinent medical, surgical, social history, OB history to HPI:5719} 38 year old female presents to the emergency department for evaluation of vaginal discharge and odor.  She states that she has been experiencing these symptoms for the past 3 weeks.  Vaginal discharge is reportedly green in color.  She does have some burning at the start of urination, but denies urinary frequency or urgency.  She is not having any fever, abdominal pain.  When she was seen for these complaints 3 weeks ago, she was prescribed doxycycline  but did not complete this course of antibiotics.  The history is provided by the patient. No language interpreter was used.       Prior to Admission medications   Medication Sig Start Date End Date Taking? Authorizing Provider  doxycycline  (VIBRA -TABS) 100 MG tablet Take 1 tablet (100 mg total) by mouth 2 (two) times daily. 01/23/24   Elnor Jayson LABOR, DO  fluconazole  (DIFLUCAN ) 150 MG tablet Take 1 tablet (150 mg total) by mouth once as needed for up to 1 dose. 01/23/24   Elnor Jayson LABOR, DO  albuterol  (PROVENTIL  HFA;VENTOLIN  HFA) 108 (90 Base) MCG/ACT inhaler Inhale 1-2 puffs into the lungs every 6 (six) hours as needed for wheezing or shortness of breath. Patient not taking: No sig reported 04/28/17 01/07/19  Petrucelli, Lucie SAUNDERS, PA-C    Allergies: Patient has no known allergies.    Review of Systems Ten systems reviewed and are negative for acute change, except as noted in the HPI.    Updated Vital Signs BP (!) 131/45 (BP Location: Right Arm)   Pulse 71   Temp 98.2 F (36.8 C)   Resp (!) 25   Ht 5' 11 (1.803 m)   Wt 108 kg   LMP 01/15/2024 (Approximate)   SpO2 100%   BMI 33.21 kg/m   Physical Exam Vitals and nursing note reviewed.   Constitutional:      General: She is not in acute distress.    Appearance: She is well-developed. She is not diaphoretic.     Comments: Obese female, tanned skin.   HENT:     Head: Normocephalic and atraumatic.  Eyes:     General: No scleral icterus.    Conjunctiva/sclera: Conjunctivae normal.  Cardiovascular:     Rate and Rhythm: Normal rate and regular rhythm.     Pulses: Normal pulses.  Pulmonary:     Effort: Pulmonary effort is normal. No respiratory distress.     Comments: Respirations even and unlabored Musculoskeletal:        General: Normal range of motion.     Cervical back: Normal range of motion.  Skin:    General: Skin is warm and dry.     Coloration: Skin is not pale.     Findings: No erythema or rash.  Neurological:     Mental Status: She is alert and oriented to person, place, and time.     Coordination: Coordination normal.  Psychiatric:        Behavior: Behavior normal.     (all labs ordered are listed, but only abnormal results are displayed) Labs Reviewed  URINALYSIS, ROUTINE W REFLEX MICROSCOPIC - Abnormal; Notable for the following components:      Result Value   APPearance HAZY (*)  Specific Gravity, Urine 1.032 (*)    Ketones, ur 5 (*)    All other components within normal limits  WET PREP, GENITAL  POC URINE PREG, ED  GC/CHLAMYDIA PROBE AMP ([place]) NOT AT [facility]    EKG: None  Radiology: No results found.  {Document cardiac monitor, telemetry assessment procedure when appropriate:32947} Procedures   Medications Ordered in the ED - No data to display    {Click here for ABCD2, HEART and other calculators REFRESH Note before signing:1}                              Medical Decision Making Amount and/or Complexity of Data Reviewed Labs: ordered.   ***  {Document critical care time when appropriate  Document review of labs and clinical decision tools ie CHADS2VASC2, etc  Document your independent review of radiology images and  any outside records  Document your discussion with family members, caretakers and with consultants  Document social determinants of health affecting pt's care  Document your decision making why or why not admission, treatments were needed:32947:::1}   Final diagnoses:  None    ED Discharge Orders     None

## 2024-02-16 ENCOUNTER — Other Ambulatory Visit: Payer: Self-pay
